# Patient Record
Sex: Female | Born: 1960
Health system: Southern US, Community
[De-identification: ages and names within clinical notes are randomized; demographics above are authoritative.]

## PROBLEM LIST (undated history)

## (undated) DIAGNOSIS — I219 Acute myocardial infarction, unspecified: Secondary | ICD-10-CM

## (undated) DIAGNOSIS — Z8619 Personal history of other infectious and parasitic diseases: Secondary | ICD-10-CM

## (undated) DIAGNOSIS — E119 Type 2 diabetes mellitus without complications: Secondary | ICD-10-CM

## (undated) DIAGNOSIS — E785 Hyperlipidemia, unspecified: Secondary | ICD-10-CM

## (undated) DIAGNOSIS — I10 Essential (primary) hypertension: Secondary | ICD-10-CM

## (undated) DIAGNOSIS — C73 Malignant neoplasm of thyroid gland: Secondary | ICD-10-CM

## (undated) HISTORY — PX: BREAST BIOPSY: SHX20

## (undated) HISTORY — DX: Malignant neoplasm of thyroid gland: C73

## (undated) HISTORY — DX: Personal history of other infectious and parasitic diseases: Z86.19

## (undated) HISTORY — PX: REDUCTION MAMMAPLASTY: SUR839

## (undated) HISTORY — DX: Type 2 diabetes mellitus without complications: E11.9

## (undated) HISTORY — DX: Essential (primary) hypertension: I10

## (undated) HISTORY — PX: TONSILLECTOMY AND ADENOIDECTOMY: SUR1326

## (undated) HISTORY — PX: BREAST REDUCTION SURGERY: SHX8

## (undated) HISTORY — DX: Hyperlipidemia, unspecified: E78.5

---

## 2012-11-19 HISTORY — PX: ABDOMINAL HYSTERECTOMY: SHX81

## 2012-11-19 HISTORY — PX: THYROIDECTOMY: SHX17

## 2017-11-19 HISTORY — PX: CHOLECYSTECTOMY: SHX55

## 2017-11-19 HISTORY — PX: APPENDECTOMY: SHX54

## 2018-07-10 LAB — LIPID PANEL
Cholesterol: 250 — AB (ref 0–200)
HDL: 31 — AB (ref 35–70)
LDL Cholesterol: 144
Triglycerides: 375 — AB (ref 40–160)

## 2018-07-10 LAB — HEMOGLOBIN A1C: Hemoglobin A1C: 6

## 2018-07-10 LAB — CA 19-9 (SERIAL): CA 19-9: 32

## 2018-11-14 ENCOUNTER — Emergency Department: Payer: 59

## 2018-11-14 ENCOUNTER — Other Ambulatory Visit: Payer: Self-pay

## 2018-11-14 ENCOUNTER — Emergency Department
Admission: EM | Admit: 2018-11-14 | Discharge: 2018-11-14 | Disposition: A | Discharge status: Home / Self Care | Payer: 59 | Attending: Emergency Medicine | Admitting: Emergency Medicine

## 2018-11-14 DIAGNOSIS — H539 Unspecified visual disturbance: Secondary | ICD-10-CM

## 2018-11-14 DIAGNOSIS — H5789 Other specified disorders of eye and adnexa: Secondary | ICD-10-CM | POA: Diagnosis present

## 2018-11-14 LAB — BASIC METABOLIC PANEL
Anion gap: 9 (ref 5–15)
BUN: 12 mg/dL (ref 6–20)
CO2: 28 mmol/L (ref 22–32)
Calcium: 9.1 mg/dL (ref 8.9–10.3)
Chloride: 104 mmol/L (ref 98–111)
Creatinine, Ser: 0.86 mg/dL (ref 0.44–1.00)
GFR calc Af Amer: >60 mL/min (ref >60)
Glucose, Bld: 167 mg/dL — ABNORMAL HIGH (ref 70–99)
Potassium: 3.8 mmol/L (ref 3.5–5.1)
Sodium: 141 mmol/L (ref 135–145)

## 2018-11-14 LAB — CBC
HCT: 38.1 % (ref 36.0–46.0)
Hemoglobin: 12.6 g/dL (ref 12.0–15.0)
MCH: 30.1 pg (ref 26.0–34.0)
MCHC: 33.1 g/dL (ref 30.0–36.0)
MCV: 91.1 fL (ref 80.0–100.0)
NRBC: 0 % (ref 0.0–0.2)
PLATELETS: 236 10*3/uL (ref 150–400)
RBC: 4.18 MIL/uL (ref 3.87–5.11)
RDW: 12 % (ref 11.5–15.5)
WBC: 4 10*3/uL (ref 4.0–10.5)

## 2018-11-14 MED ORDER — GADOBUTROL 1 MMOL/ML IV SOLN
6.5000 mL | Freq: Once | INTRAVENOUS | Status: AC | PRN
  Administered 2018-11-14: 6.5 mL via INTRAVENOUS

## 2018-11-14 NOTE — ED Notes (Signed)
Discussed pt with Dr. Cherylann Banas, new orders received for labs and visual acuity screening.

## 2018-11-14 NOTE — ED Notes (Signed)
ED Provider at bedside. 

## 2018-11-14 NOTE — ED Triage Notes (Addendum)
Pt arrived via POV with reports of recently being diagnosed with an inflammed optic nerve on the left side in [place] on 12/6 where she lives, pt states that her optometrist in [place] told her to come to get an MRI.   Pt states over the past several days she has had worsening vision problems, pt states she has an appt with [facility] neuroophthamology in a week.

## 2018-11-14 NOTE — ED Notes (Signed)
Patient returned from MRI.

## 2018-11-14 NOTE — Discharge Instructions (Addendum)
Follow-up with the neuro-ophthalmologist as scheduled.  Return to the ER for new, worsening, or persistent severe vision changes or vision loss, eye pain, swelling of the eye, any confusion or change in mental status, severe headache, vomiting, weakness or numbness, or any other new or worsening symptoms that concern you.

## 2018-11-14 NOTE — ED Notes (Signed)
Patient transported to MRI 

## 2018-11-14 NOTE — ED Notes (Signed)
Pt was dx with optic neuritis and has appt with [facility] next week.  Has decreased lower and inner visual field that started December 5th but per patient over last 2 days had started increasing more toward center of visual field.  She spoke to ophthalmologist and they told her to come to ED for MRI.  Mild "soreness behind left eye" but no other pain".  Ambulatory.

## 2018-11-14 NOTE — ED Provider Notes (Signed)
[facility] Emergency Department Provider Note ____________________________________________   First MD Initiated Contact with Patient 11/14/18 1828     (approximate)  I have reviewed the triage vital signs and the nursing notes.   HISTORY  Chief Complaint Eye Problem    HPI Melissa Lamb is a 57 y.o. female who presents with concern for optic nerve inflammation, referred for MRI.  The patient reports loss of vision in the medial lower quadrant of her vision in the left eye since December 5.  It has worsened slightly over the last several days.  The patient saw an ophthalmologist already, but has not had any imaging.  She was told to come to the ER for evaluation and likely MRI since her symptoms were worsening.  The patient also reports some soreness or pressure behind her left eye but no pain.  She denies any numbness, weakness, or other neurologic symptoms.  She denies any prior history of optic neuritis or MS.  She reports that she lives in [place] but is visiting her daughter here in [place], and also has follow-up set up with a neuro-ophthalmologist at [facility] for next week.  No past medical history on file.  There are no active problems to display for this patient.     Prior to Admission medications   Not on File    Allergies Patient has no known allergies.  No family history on file.  Social History Social History   Tobacco Use  . Smoking status: Not on file  Substance Use Topics  . Alcohol use: Not on file  . Drug use: Not on file    Review of Systems  Constitutional: No fever. Eyes: Positive for worsening vision loss. ENT: No neck pain. Cardiovascular: Denies chest pain. Respiratory: Denies shortness of breath. Gastrointestinal: No vomiting.  Genitourinary: Negative for flank pain.  Musculoskeletal: Negative for back pain. Skin: Negative for rash. Neurological: Negative for  headache.  ____________________________________________   PHYSICAL EXAM:  VITAL SIGNS: ED Triage Vitals  Enc Vitals Group     BP --      Pulse --      Resp --      Temp --      Temp src --      SpO2 --      Weight 11/14/18 1543 146 lb (66.2 kg)     Height 11/14/18 1543 5\' 8"  (1.727 m)     Head Circumference --      Peak Flow --      Pain Score 11/14/18 1547 2     Pain Loc --      Pain Edu? --      Excl. in [place]? --     Constitutional: Alert and oriented. Well appearing and in no acute distress. Eyes: Conjunctivae are normal.  EOMI.  PERRLA. Head: Atraumatic. Nose: No congestion/rhinnorhea. Mouth/Throat: Mucous membranes are moist.   Neck: Normal range of motion.  Cardiovascular: Good peripheral circulation. Respiratory: Normal respiratory effort.   Gastrointestinal: No distention.  Musculoskeletal:  Extremities warm and well perfused.  Neurologic:  Normal speech and language.  Motor intact in all extremities.  Normal coordination.  No gross focal neurologic deficits are appreciated.  Skin:  Skin is warm and dry. No rash noted. Psychiatric: Mood and affect are normal. Speech and behavior are normal.  ____________________________________________   LABS (all labs ordered are listed, but only abnormal results are displayed)  Labs Reviewed  BASIC METABOLIC PANEL - Abnormal; Notable for the following components:  Result Value   Glucose, Bld 167 (*)    All other components within normal limits  CBC   ____________________________________________  EKG   ____________________________________________  RADIOLOGY  MR brain: Left orbital floor defect which does not appear acute; no  acute abnormalities  ____________________________________________   PROCEDURES  Procedure(s) performed: No  Procedures  Critical Care performed: No ____________________________________________   INITIAL IMPRESSION / ASSESSMENT AND PLAN / ED COURSE  Pertinent labs & imaging  results that were available during my care of the patient were reviewed by me and considered in my medical decision making (see chart for details).  57 year old female with PMH as noted above presents with vision loss in 1 quadrant of her left eye over the last 3 weeks, but worsening over the last several days.  She was instructed by her ophthalmologist to come to the ED for an MRI.  The patient denies any other vision symptoms or any other acute neurologic symptoms.  She has no history of MS, and has never previously had  On exam the patient has a deficit in the medial lower quadrant in the left eye but no other acute neurologic symptoms.  She is comfortable appearing and her vital signs are normal except for slight hypertension.  We will obtain an MRI with and without contrast of the brain as recommended by radiology.  ----------------------------------------- 12:06 AM on 11/15/2018 -----------------------------------------  MRI showed a small possible defect in the floor of the left orbit but no acute abnormalities.  The patient denies any known trauma to the orbit.  This may be artifactual.  The MRI does not show any findings requiring further ED work-up, emergent consultation, or admission.  The patient already has follow-up arranged with neuro-ophthalmology.  She is stable for discharge at this time.  She feels comfortable with going home.  I discussed the results of the imaging with her.  I gave the patient thorough return precautions and she expressed understanding. ____________________________________________   FINAL CLINICAL IMPRESSION(S) / ED DIAGNOSES  Final diagnoses:  Visual disturbance      NEW MEDICATIONS STARTED DURING THIS VISIT:  There are no discharge medications for this patient.    Note:  This document was prepared using Dragon voice recognition software and may include unintentional dictation errors.    Arta Silence, MD 11/15/18 [phone]

## 2018-11-16 DIAGNOSIS — S83249A Other tear of medial meniscus, current injury, unspecified knee, initial encounter: Secondary | ICD-10-CM | POA: Insufficient documentation

## 2018-12-02 DIAGNOSIS — H47012 Ischemic optic neuropathy, left eye: Secondary | ICD-10-CM | POA: Insufficient documentation

## 2018-12-02 DIAGNOSIS — H3581 Retinal edema: Secondary | ICD-10-CM | POA: Insufficient documentation

## 2018-12-02 DIAGNOSIS — H53452 Other localized visual field defect, left eye: Secondary | ICD-10-CM | POA: Insufficient documentation

## 2019-10-20 ENCOUNTER — Encounter: Payer: Self-pay | Admitting: Family Medicine

## 2019-10-20 ENCOUNTER — Other Ambulatory Visit: Payer: Self-pay

## 2019-10-20 ENCOUNTER — Ambulatory Visit (INDEPENDENT_AMBULATORY_CARE_PROVIDER_SITE_OTHER): Payer: 59 | Admitting: Family Medicine

## 2019-10-20 VITALS — BP 158/88 | HR 64 | Temp 98.1°F | Resp 16 | Ht 67.5 in | Wt 139.5 lb

## 2019-10-20 DIAGNOSIS — I10 Essential (primary) hypertension: Secondary | ICD-10-CM | POA: Diagnosis not present

## 2019-10-20 DIAGNOSIS — Z1231 Encounter for screening mammogram for malignant neoplasm of breast: Secondary | ICD-10-CM | POA: Diagnosis not present

## 2019-10-20 DIAGNOSIS — E1169 Type 2 diabetes mellitus with other specified complication: Secondary | ICD-10-CM | POA: Insufficient documentation

## 2019-10-20 DIAGNOSIS — E119 Type 2 diabetes mellitus without complications: Secondary | ICD-10-CM | POA: Insufficient documentation

## 2019-10-20 DIAGNOSIS — E785 Hyperlipidemia, unspecified: Secondary | ICD-10-CM

## 2019-10-20 DIAGNOSIS — E890 Postprocedural hypothyroidism: Secondary | ICD-10-CM

## 2019-10-20 DIAGNOSIS — Z0000 Encounter for general adult medical examination without abnormal findings: Secondary | ICD-10-CM | POA: Insufficient documentation

## 2019-10-20 LAB — POCT GLYCOSYLATED HEMOGLOBIN (HGB A1C): Hemoglobin A1C: 5.8 % — AB (ref 4.0–5.6)

## 2019-10-20 MED ORDER — AMLODIPINE BESYLATE 5 MG PO TABS: 5.0000 mg | ORAL_TABLET | Freq: Every day | ORAL | 3 refills | Status: DC

## 2019-10-20 MED ORDER — METFORMIN HCL ER 500 MG PO TB24: 500.0000 mg | ORAL_TABLET | Freq: Two times a day (BID) | ORAL | 2 refills | Status: DC

## 2019-10-20 MED ORDER — METOPROLOL SUCCINATE ER 25 MG PO TB24: 25.0000 mg | ORAL_TABLET | Freq: Every day | ORAL | 2 refills | Status: DC

## 2019-10-20 MED ORDER — FENOFIBRATE 160 MG PO TABS: 160.0000 mg | ORAL_TABLET | Freq: Every day | ORAL | 2 refills | Status: AC

## 2019-10-20 MED ORDER — GLIPIZIDE 10 MG PO TABS: 10.0000 mg | ORAL_TABLET | Freq: Every day | ORAL | 2 refills | Status: DC

## 2019-10-20 NOTE — Patient Instructions (Addendum)
Your blood pressure high.   High blood pressure increases your risk for heart attack and stroke.   Start amlodipine  Check blood pressure - MyChart message in 3 weeks with what your blood pressure has been doing   Please check your blood pressure 2-4 times a week.   To check your blood pressure 1) Sit in a quiet and relaxed place for 5 minutes 2) Make sure your feet are flat on the ground 3) Consider checking first thing in the morning   Normal blood pressure is less than 140/90 Ideally you blood pressure should be around 120/80  Other ways you can reduce your blood pressure:  1) Regular exercise -- Try to get 150 minutes (30 minutes, 5 days a week) of moderate to vigorous aerobic excercise -- Examples: brisk walking (2.5 miles per hour), water aerobics, dancing, gardening, tennis, biking slower than 10 miles per hour 2) DASH Diet - low fat meats, more fresh fruits and vegetables, whole grains, low salt 3) Quit smoking if you smoke 4) Loose 5-10% of your body weight

## 2019-10-20 NOTE — Progress Notes (Signed)
Subjective:     Melissa Lamb is a 58 y.o. female presenting for Establish Care (Dr. Burnett Kanaris in [place] around 07/2019. [facility] [place] colonoscopy. 2014) and Medication Management     HPI   #s/p thyroidectomy - had her thyroid medication recently reduce  #Diabetes - on metformin - hemoglobin a1c was ~6% - was doing well until her husband became ill  #HLD - taking medication - no issues  #HTN - does not check at home - has not been walking as much - going through a lot of stress - husband recently passed away - takes metoprolol - had side effects on lisinopril - mild cough  #husband with hx of pancreas cancer - concerned his risk was environmental - that she may be at risk as well - husband had to have several CT scans before his cancer was found - had previous CA-19 which was normal   Review of Systems  Constitutional: Negative for chills and fever.  HENT: Negative for congestion.   Eyes: Negative for visual disturbance.       Has damage to her optic nerve  Respiratory: Negative for chest tightness and shortness of breath.   Cardiovascular: Negative for chest pain.  Gastrointestinal: Negative for abdominal pain.  Endocrine: Negative for cold intolerance and heat intolerance.  Genitourinary: Negative.   Musculoskeletal: Negative.   Skin: Negative.   Allergic/Immunologic: Negative.   Neurological: Negative for headaches.  Hematological: Negative.   Psychiatric/Behavioral: Negative.    I have reviewed the patients PMH, PSH, FmHx, Social Hx, medications and allergies and they are updated in Epic.    Social History   Tobacco Use  Smoking Status Never Smoker  Smokeless Tobacco Never Used        Objective:    BP Readings from Last 3 Encounters:  10/20/19 (!) 158/88  11/14/18 (!) 165/80   Wt Readings from Last 3 Encounters:  10/20/19 139 lb 8 oz (63.3 kg)  11/14/18 146 lb (66.2 kg)    BP (!) 158/88   Pulse 64   Temp 98.1 F (36.7  C)   Resp 16   Ht 5' 7.5" (1.715 m)   Wt 139 lb 8 oz (63.3 kg)   SpO2 98%   BMI 21.53 kg/m    Physical Exam Constitutional:      General: She is not in acute distress.    Appearance: She is well-developed. She is not diaphoretic.  HENT:     Right Ear: External ear normal.     Left Ear: External ear normal.     Nose: Nose normal.  Eyes:     Conjunctiva/sclera: Conjunctivae normal.  Neck:     Musculoskeletal: Neck supple.  Cardiovascular:     Rate and Rhythm: Normal rate and regular rhythm.     Heart sounds: No murmur.  Pulmonary:     Effort: Pulmonary effort is normal. No respiratory distress.     Breath sounds: Normal breath sounds. No wheezing.  Skin:    General: Skin is warm and dry.     Capillary Refill: Capillary refill takes less than 2 seconds.  Neurological:     Mental Status: She is alert. Mental status is at baseline.  Psychiatric:        Mood and Affect: Mood normal.        Behavior: Behavior normal.    Lab Results  Component Value Date   HGBA1C 5.8 (A) 10/20/2019          Assessment & Plan:   Problem  List Items Addressed This Visit      Cardiovascular and Mediastinum   Essential hypertension    BP elevated today and at other appointments. Recommended adding amlodipine today. Home monitoring and mychart in 3 weeks to determine when to f/u. Encouraged exercise      Relevant Medications   fenofibrate 160 MG tablet   metoprolol succinate (TOPROL-XL) 25 MG 24 hr tablet   amLODipine (NORVASC) 5 MG tablet     Endocrine   Diabetes mellitus without complication ([place]) - Primary    POC HgbA1c today. Encouraged exercise. Cont metformin. Mychart to pt that she could stop glipizide due to excellent control and continue to monitor.       Relevant Medications   glipiZIDE (GLUCOTROL) 10 MG tablet   metFORMIN (GLUCOPHAGE-XR) 500 MG 24 hr tablet   Other Relevant Orders   HgB A1c (Completed)   Hyperlipidemia associated with type 2 diabetes mellitus ([place])     Will obtain outside labs. Repeat annually.       Relevant Medications   fenofibrate 160 MG tablet   glipiZIDE (GLUCOTROL) 10 MG tablet   metFORMIN (GLUCOPHAGE-XR) 500 MG 24 hr tablet   Post-surgical hypothyroidism    Last TSH <0.1 follows with endocrinology. Synthroid recently reduced to 88 mcg. Continue to monitor, may be contributing to HTN      Relevant Medications   levothyroxine (SYNTHROID) 88 MCG tablet   metoprolol succinate (TOPROL-XL) 25 MG 24 hr tablet     Other   RESOLVED: Encounter for screening mammogram for malignant neoplasm of breast   Relevant Orders   MM Digital Screening       Return in about 3 months (around 01/18/2020).  [name], MD

## 2019-10-20 NOTE — Assessment & Plan Note (Signed)
Last TSH <0.1 follows with endocrinology. Synthroid recently reduced to 88 mcg. Continue to monitor, may be contributing to HTN

## 2019-10-20 NOTE — Assessment & Plan Note (Signed)
BP elevated today and at other appointments. Recommended adding amlodipine today. Home monitoring and mychart in 3 weeks to determine when to f/u. Encouraged exercise

## 2019-10-20 NOTE — Assessment & Plan Note (Signed)
Will obtain outside labs. Repeat annually.

## 2019-10-20 NOTE — Assessment & Plan Note (Addendum)
POC HgbA1c today. Encouraged exercise. Cont metformin. Mychart to pt that she could stop glipizide due to excellent control and continue to monitor.

## 2019-10-26 ENCOUNTER — Telehealth: Payer: Self-pay

## 2019-10-26 NOTE — Telephone Encounter (Signed)
Left message for patient to call back Patient stated during her LOV she saw Lars Mage for eye exam, in [place], per google one number came up and they do not have this provider. need to verify name and if she has phone number or name for the office. Have ROI form on my desk.

## 2019-10-27 ENCOUNTER — Telehealth: Payer: Self-pay

## 2019-10-27 NOTE — Telephone Encounter (Signed)
Copied from [place] [id number]. Topic: General - Inquiry >> Oct 26, 2019  5:11 PM Alease Frame wrote: Reason for [id number] returning call from office . Please advise

## 2019-10-27 NOTE — Telephone Encounter (Signed)
See other message in the chart. 

## 2019-10-27 NOTE — Telephone Encounter (Signed)
Spoke with patient. Collet[facility] [place] was an optometrist for glasses only but patient has been seen at [facility] eye office this year. Will request record if they have for diabetic eye exam. [address], [address]  [place], [place] [address]  [phone]  [phone] (Fax)

## 2019-10-30 ENCOUNTER — Ambulatory Visit
Admission: RE | Admit: 2019-10-30 | Discharge: 2019-10-30 | Disposition: A | Discharge status: Home / Self Care | Payer: 59 | Source: Ambulatory Visit | Attending: Family Medicine | Admitting: Family Medicine

## 2019-10-30 DIAGNOSIS — Z1231 Encounter for screening mammogram for malignant neoplasm of breast: Secondary | ICD-10-CM | POA: Diagnosis not present

## 2019-11-11 ENCOUNTER — Encounter: Payer: Self-pay | Admitting: Family Medicine

## 2019-11-11 ENCOUNTER — Other Ambulatory Visit: Payer: Self-pay | Admitting: Family Medicine

## 2019-11-11 DIAGNOSIS — N6489 Other specified disorders of breast: Secondary | ICD-10-CM

## 2019-11-11 DIAGNOSIS — R928 Other abnormal and inconclusive findings on diagnostic imaging of breast: Secondary | ICD-10-CM

## 2019-11-16 ENCOUNTER — Ambulatory Visit
Admission: RE | Admit: 2019-11-16 | Discharge: 2019-11-16 | Disposition: A | Discharge status: Home / Self Care | Payer: 59 | Source: Ambulatory Visit | Attending: Family Medicine | Admitting: Family Medicine

## 2019-11-16 ENCOUNTER — Encounter: Payer: Self-pay | Admitting: Family Medicine

## 2019-11-16 DIAGNOSIS — R928 Other abnormal and inconclusive findings on diagnostic imaging of breast: Secondary | ICD-10-CM

## 2019-11-16 DIAGNOSIS — N6489 Other specified disorders of breast: Secondary | ICD-10-CM | POA: Insufficient documentation

## 2020-01-26 ENCOUNTER — Ambulatory Visit: Payer: 59 | Admitting: Family Medicine

## 2020-02-22 ENCOUNTER — Encounter: Payer: Self-pay | Admitting: Family Medicine

## 2020-02-22 ENCOUNTER — Other Ambulatory Visit: Payer: Self-pay

## 2020-02-22 ENCOUNTER — Ambulatory Visit (INDEPENDENT_AMBULATORY_CARE_PROVIDER_SITE_OTHER): Payer: BC Managed Care – PPO | Admitting: Family Medicine

## 2020-02-22 VITALS — BP 122/88 | HR 64 | Temp 97.7°F | Ht 67.5 in | Wt 146.0 lb

## 2020-02-22 DIAGNOSIS — E119 Type 2 diabetes mellitus without complications: Secondary | ICD-10-CM | POA: Diagnosis not present

## 2020-02-22 DIAGNOSIS — E890 Postprocedural hypothyroidism: Secondary | ICD-10-CM | POA: Diagnosis not present

## 2020-02-22 DIAGNOSIS — I10 Essential (primary) hypertension: Secondary | ICD-10-CM | POA: Diagnosis not present

## 2020-02-22 DIAGNOSIS — Z1211 Encounter for screening for malignant neoplasm of colon: Secondary | ICD-10-CM

## 2020-02-22 DIAGNOSIS — R6889 Other general symptoms and signs: Secondary | ICD-10-CM | POA: Insufficient documentation

## 2020-02-22 LAB — POCT GLYCOSYLATED HEMOGLOBIN (HGB A1C): Hemoglobin A1C: 6.1 % — AB (ref 4.0–5.6)

## 2020-02-22 LAB — TSH: TSH: 0.73 u[IU]/mL (ref 0.35–4.50)

## 2020-02-22 NOTE — Assessment & Plan Note (Signed)
Hgb A1c 6.1, still good control. Discussed if doing better with diet and exercise and home numbers decreasing could try stopping glipizide. Otherwise continue metformin and glipizide.

## 2020-02-22 NOTE — Progress Notes (Signed)
Subjective:     Melissa Lamb is a 59 y.o. female presenting for Diabetes (Checks blood sugar at home regularly. "Winter was hard".), Hypertension (Checks BP occassionally.), and Ears and Eyes Issue     HPI  #Ears/Eyes issues - was getting a rash on both eyelids - every 6 months - started after iodine treatment - itchy ears - using oil in the ears - very itchy - baseline has significant itchiness of both eyes - rash most recently on February 25 and improved with oil facial product and cream  #Diabetes - had a harder time following the diet this based winter - [place] - was not exercising as often - noticed her numbers going up so went back on the medication - still taking metformin and glipizide  #HTN - taking the amlodipine 5 mg and metoprolol  #Hypothyroidism - was seeing endocrine - last TSH was abnormal and dose decreased - has not seen endocrinology - requests that I take over treatment  Review of Systems  10/20/2019: Clinic - HTN - adding amlodipine. DM - stop glipizide due to good control.   Social History   Tobacco Use  Smoking Status Never Smoker  Smokeless Tobacco Never Used        Objective:    BP Readings from Last 3 Encounters:  02/22/20 122/88  10/20/19 (!) 158/88  11/14/18 (!) 165/80   Wt Readings from Last 3 Encounters:  02/22/20 146 lb (66.2 kg)  10/20/19 139 lb 8 oz (63.3 kg)  11/14/18 146 lb (66.2 kg)    BP 122/88 (BP Location: Left Arm, Patient Position: Sitting, Cuff Size: Normal)   Pulse 64   Temp 97.7 F (36.5 C)   Ht 5' 7.5" (1.715 m)   Wt 146 lb (66.2 kg)   SpO2 100%   BMI 22.53 kg/m    Physical Exam Constitutional:      General: She is not in acute distress.    Appearance: She is well-developed. She is not diaphoretic.  HENT:     Right Ear: Tympanic membrane, ear canal and external ear normal.     Left Ear: Tympanic membrane, ear canal and external ear normal.     Nose: Nose  normal.  Eyes:     General: No scleral icterus.    Extraocular Movements: Extraocular movements intact.     Conjunctiva/sclera: Conjunctivae normal.  Cardiovascular:     Rate and Rhythm: Normal rate.  Pulmonary:     Effort: Pulmonary effort is normal.  Musculoskeletal:     Cervical back: Neck supple.  Skin:    General: Skin is warm and dry.     Capillary Refill: Capillary refill takes less than 2 seconds.  Neurological:     Mental Status: She is alert. Mental status is at baseline.  Psychiatric:        Mood and Affect: Mood normal.        Behavior: Behavior normal.           Assessment & Plan:   Problem List Items Addressed This Visit      Cardiovascular and Mediastinum   Essential hypertension    BP at goal, continue amlodipine and metoprolol        Endocrine   Diabetes mellitus without complication ([facility]) - Primary    Hgb A1c 6.1, still good control. Discussed if doing better with diet and exercise and home numbers decreasing could try stopping glipizide. Otherwise continue metformin and glipizide.  Relevant Orders   HgB A1c (Completed)   Post-surgical hypothyroidism    Last dose decrease to 88 mcg was several months ago. Repeat TSH and change as needed.       Relevant Orders   TSH     Other   Itchy eyes    Pt notes recurrent rash (not present today) and itchy eyes/ears since iodine treatment. Has seen ophthalmology and ENT in the past for this. Recommended continue oil treatment as she is doing and add daily allergy medication to see if itching improves. Return if worsening symptoms present. Normal exam today       Other Visit Diagnoses    Screening for colon cancer       Relevant Orders   Ambulatory referral to Gastroenterology       Return in about 3 months (around 05/23/2020).  [name], MD

## 2020-02-22 NOTE — Assessment & Plan Note (Signed)
Pt notes recurrent rash (not present today) and itchy eyes/ears since iodine treatment. Has seen ophthalmology and ENT in the past for this. Recommended continue oil treatment as she is doing and add daily allergy medication to see if itching improves. Return if worsening symptoms present. Normal exam today

## 2020-02-22 NOTE — Patient Instructions (Addendum)
Great to see you today!   Diabetes - keep taking your current medications - continue to work on adding exercising - if you notice your blood sugar is improving, you could try stopping the glipizide if you notice improved control  Hypertension - keep taking the amlodipine - blood pressure looks great!  Ears and Eyes - consider trying a daily allergy medication (Claritin, Zyrtec, allegra)   Call back with where you got your liver ultrasound done

## 2020-02-22 NOTE — Assessment & Plan Note (Signed)
Last dose decrease to 88 mcg was several months ago. Repeat TSH and change as needed.

## 2020-02-22 NOTE — Assessment & Plan Note (Signed)
BP at goal, continue amlodipine and metoprolol

## 2020-03-10 ENCOUNTER — Other Ambulatory Visit: Payer: Self-pay

## 2020-03-10 DIAGNOSIS — E119 Type 2 diabetes mellitus without complications: Secondary | ICD-10-CM

## 2020-03-10 MED ORDER — METFORMIN HCL ER 500 MG PO TB24: 500.0000 mg | ORAL_TABLET | Freq: Two times a day (BID) | ORAL | 2 refills | Status: DC

## 2020-04-01 ENCOUNTER — Inpatient Hospital Stay
Admission: EM | Admit: 2020-04-01 | Discharge: 2020-04-03 | DRG: 247 | Disposition: A | Discharge status: Home / Self Care | Payer: BC Managed Care – PPO | Attending: Hospitalist | Admitting: Hospitalist

## 2020-04-01 ENCOUNTER — Emergency Department: Payer: BC Managed Care – PPO

## 2020-04-01 ENCOUNTER — Other Ambulatory Visit: Payer: Self-pay

## 2020-04-01 ENCOUNTER — Encounter: Payer: Self-pay | Admitting: Emergency Medicine

## 2020-04-01 ENCOUNTER — Encounter
Admission: EM | Disposition: A | Discharge status: Home / Self Care | Payer: Self-pay | Source: Home / Self Care | Attending: Hospitalist

## 2020-04-01 DIAGNOSIS — Z833 Family history of diabetes mellitus: Secondary | ICD-10-CM

## 2020-04-01 DIAGNOSIS — I25119 Atherosclerotic heart disease of native coronary artery with unspecified angina pectoris: Secondary | ICD-10-CM | POA: Diagnosis present

## 2020-04-01 DIAGNOSIS — Z83438 Family history of other disorder of lipoprotein metabolism and other lipidemia: Secondary | ICD-10-CM

## 2020-04-01 DIAGNOSIS — Z20822 Contact with and (suspected) exposure to covid-19: Secondary | ICD-10-CM | POA: Diagnosis not present

## 2020-04-01 DIAGNOSIS — I25110 Atherosclerotic heart disease of native coronary artery with unstable angina pectoris: Secondary | ICD-10-CM | POA: Diagnosis not present

## 2020-04-01 DIAGNOSIS — Z79890 Hormone replacement therapy (postmenopausal): Secondary | ICD-10-CM | POA: Diagnosis not present

## 2020-04-01 DIAGNOSIS — I2119 ST elevation (STEMI) myocardial infarction involving other coronary artery of inferior wall: Secondary | ICD-10-CM | POA: Diagnosis not present

## 2020-04-01 DIAGNOSIS — E1169 Type 2 diabetes mellitus with other specified complication: Secondary | ICD-10-CM | POA: Diagnosis present

## 2020-04-01 DIAGNOSIS — Z95820 Peripheral vascular angioplasty status with implants and grafts: Secondary | ICD-10-CM | POA: Diagnosis not present

## 2020-04-01 DIAGNOSIS — Z7984 Long term (current) use of oral hypoglycemic drugs: Secondary | ICD-10-CM

## 2020-04-01 DIAGNOSIS — Z8249 Family history of ischemic heart disease and other diseases of the circulatory system: Secondary | ICD-10-CM | POA: Diagnosis not present

## 2020-04-01 DIAGNOSIS — Z8049 Family history of malignant neoplasm of other genital organs: Secondary | ICD-10-CM

## 2020-04-01 DIAGNOSIS — R0602 Shortness of breath: Secondary | ICD-10-CM | POA: Diagnosis not present

## 2020-04-01 DIAGNOSIS — I252 Old myocardial infarction: Secondary | ICD-10-CM | POA: Diagnosis present

## 2020-04-01 DIAGNOSIS — I2102 ST elevation (STEMI) myocardial infarction involving left anterior descending coronary artery: Secondary | ICD-10-CM | POA: Diagnosis not present

## 2020-04-01 DIAGNOSIS — Z803 Family history of malignant neoplasm of breast: Secondary | ICD-10-CM

## 2020-04-01 DIAGNOSIS — I10 Essential (primary) hypertension: Secondary | ICD-10-CM | POA: Diagnosis not present

## 2020-04-01 DIAGNOSIS — Z85850 Personal history of malignant neoplasm of thyroid: Secondary | ICD-10-CM | POA: Diagnosis not present

## 2020-04-01 DIAGNOSIS — I2129 ST elevation (STEMI) myocardial infarction involving other sites: Principal | ICD-10-CM | POA: Diagnosis present

## 2020-04-01 DIAGNOSIS — I214 Non-ST elevation (NSTEMI) myocardial infarction: Secondary | ICD-10-CM | POA: Diagnosis not present

## 2020-04-01 DIAGNOSIS — E782 Mixed hyperlipidemia: Secondary | ICD-10-CM | POA: Diagnosis not present

## 2020-04-01 DIAGNOSIS — E785 Hyperlipidemia, unspecified: Secondary | ICD-10-CM

## 2020-04-01 DIAGNOSIS — E890 Postprocedural hypothyroidism: Secondary | ICD-10-CM | POA: Diagnosis present

## 2020-04-01 DIAGNOSIS — R079 Chest pain, unspecified: Secondary | ICD-10-CM

## 2020-04-01 DIAGNOSIS — R0789 Other chest pain: Secondary | ICD-10-CM | POA: Diagnosis not present

## 2020-04-01 HISTORY — PX: LEFT HEART CATH AND CORONARY ANGIOGRAPHY: CATH118249

## 2020-04-01 HISTORY — PX: CORONARY/GRAFT ACUTE MI REVASCULARIZATION: CATH118305

## 2020-04-01 LAB — PROTIME-INR
INR: 1.5 — ABNORMAL HIGH (ref 0.8–1.2)
Prothrombin Time: 17.8 seconds — ABNORMAL HIGH (ref 11.4–15.2)

## 2020-04-01 LAB — BASIC METABOLIC PANEL
Anion gap: 10 (ref 5–15)
BUN: 14 mg/dL (ref 6–20)
CO2: 26 mmol/L (ref 22–32)
Calcium: 9.5 mg/dL (ref 8.9–10.3)
Chloride: 102 mmol/L (ref 98–111)
Creatinine, Ser: 0.79 mg/dL (ref 0.44–1.00)
GFR calc Af Amer: >60 mL/min (ref >60)
GFR calc non Af Amer: >60 mL/min (ref >60)
Glucose, Bld: 145 mg/dL — ABNORMAL HIGH (ref 70–99)
Potassium: 3.7 mmol/L (ref 3.5–5.1)
Sodium: 138 mmol/L (ref 135–145)

## 2020-04-01 LAB — HEMOGLOBIN A1C
Hgb A1c MFr Bld: 6 % — ABNORMAL HIGH (ref 4.8–5.6)
Mean Plasma Glucose: 125.5 mg/dL

## 2020-04-01 LAB — CBC
HCT: 40.3 % (ref 36.0–46.0)
Hemoglobin: 13.5 g/dL (ref 12.0–15.0)
MCH: 30.9 pg (ref 26.0–34.0)
MCHC: 33.5 g/dL (ref 30.0–36.0)
MCV: 92.2 fL (ref 80.0–100.0)
Platelets: 244 10*3/uL (ref 150–400)
RBC: 4.37 MIL/uL (ref 3.87–5.11)
RDW: 11.9 % (ref 11.5–15.5)
WBC: 11.3 10*3/uL — ABNORMAL HIGH (ref 4.0–10.5)
nRBC: 0 % (ref 0.0–0.2)

## 2020-04-01 LAB — APTT: aPTT: 78 seconds — ABNORMAL HIGH (ref 24–36)

## 2020-04-01 LAB — POCT ACTIVATED CLOTTING TIME: Activated Clotting Time: 373 seconds

## 2020-04-01 LAB — TROPONIN I (HIGH SENSITIVITY)
Troponin I (High Sensitivity): 9616 ng/L (ref <18)
Troponin I (High Sensitivity): >27000 ng/L (ref <18)

## 2020-04-01 LAB — GLUCOSE, CAPILLARY: Glucose-Capillary: 167 mg/dL — ABNORMAL HIGH (ref 70–99)

## 2020-04-01 LAB — MRSA PCR SCREENING: MRSA by PCR: NEGATIVE

## 2020-04-01 LAB — SARS CORONAVIRUS 2 BY RT PCR (HOSPITAL ORDER, PERFORMED IN ~~LOC~~ HOSPITAL LAB): SARS Coronavirus 2: NEGATIVE

## 2020-04-01 SURGERY — CORONARY/GRAFT ACUTE MI REVASCULARIZATION
Anesthesia: Moderate Sedation

## 2020-04-01 MED ORDER — ASPIRIN 81 MG PO CHEW
324.0000 mg | CHEWABLE_TABLET | Freq: Once | ORAL | Status: AC
  Administered 2020-04-01: 324 mg via ORAL
  Filled 2020-04-01: qty 4

## 2020-04-01 MED ORDER — HEPARIN SODIUM (PORCINE) 5000 UNIT/ML IJ SOLN
5000.0000 [IU] | Freq: Three times a day (TID) | INTRAMUSCULAR | Status: DC
  Administered 2020-04-01 – 2020-04-02 (×2): 5000 [IU] via SUBCUTANEOUS
  Filled 2020-04-01 (×2): qty 1

## 2020-04-01 MED ORDER — METOPROLOL SUCCINATE ER 25 MG PO TB24
25.0000 mg | ORAL_TABLET | Freq: Every day | ORAL | Status: DC
  Administered 2020-04-02 – 2020-04-03 (×2): 25 mg via ORAL
  Filled 2020-04-01 (×2): qty 1

## 2020-04-01 MED ORDER — ONDANSETRON HCL 4 MG/2ML IJ SOLN: 4.0000 mg | Freq: Four times a day (QID) | INTRAMUSCULAR | Status: DC | PRN

## 2020-04-01 MED ORDER — INSULIN ASPART 100 UNIT/ML ~~LOC~~ SOLN: 0.0000 [IU] | Freq: Every day | SUBCUTANEOUS | Status: DC

## 2020-04-01 MED ORDER — MIDAZOLAM HCL 2 MG/2ML IJ SOLN
INTRAMUSCULAR | Status: AC
  Filled 2020-04-01: qty 2

## 2020-04-01 MED ORDER — SODIUM CHLORIDE 0.9 % IV SOLN: INTRAVENOUS | Status: DC

## 2020-04-01 MED ORDER — LISINOPRIL 10 MG PO TABS
5.0000 mg | ORAL_TABLET | Freq: Every day | ORAL | Status: DC
  Filled 2020-04-01: qty 1
  Filled 2020-04-01 (×2): qty 0.5

## 2020-04-01 MED ORDER — TICAGRELOR 90 MG PO TABS
90.0000 mg | ORAL_TABLET | Freq: Two times a day (BID) | ORAL | Status: DC
  Administered 2020-04-02 – 2020-04-03 (×4): 90 mg via ORAL
  Filled 2020-04-01 (×4): qty 1

## 2020-04-01 MED ORDER — HEPARIN (PORCINE) IN NACL 1000-0.9 UT/500ML-% IV SOLN
INTRAVENOUS | Status: AC
  Filled 2020-04-01: qty 1000

## 2020-04-01 MED ORDER — TICAGRELOR 90 MG PO TABS
ORAL_TABLET | ORAL | Status: DC | PRN
  Administered 2020-04-01: 180 mg via ORAL

## 2020-04-01 MED ORDER — NITROGLYCERIN 1 MG/10 ML FOR IR/CATH LAB
INTRA_ARTERIAL | Status: DC | PRN
  Administered 2020-04-01 (×2): 200 ug via INTRACORONARY

## 2020-04-01 MED ORDER — TIROFIBAN (AGGRASTAT) BOLUS VIA INFUSION
INTRAVENOUS | Status: DC | PRN
  Administered 2020-04-01: 1655 ug via INTRAVENOUS

## 2020-04-01 MED ORDER — FENTANYL CITRATE (PF) 100 MCG/2ML IJ SOLN
INTRAMUSCULAR | Status: AC
  Filled 2020-04-01: qty 2

## 2020-04-01 MED ORDER — IOHEXOL 300 MG/ML  SOLN
INTRAMUSCULAR | Status: DC | PRN
  Administered 2020-04-01: 365 mL

## 2020-04-01 MED ORDER — TICAGRELOR 90 MG PO TABS
ORAL_TABLET | ORAL | Status: AC
  Filled 2020-04-01: qty 1

## 2020-04-01 MED ORDER — ROSUVASTATIN CALCIUM 10 MG PO TABS
40.0000 mg | ORAL_TABLET | Freq: Every day | ORAL | Status: DC
  Administered 2020-04-01 – 2020-04-03 (×3): 40 mg via ORAL
  Filled 2020-04-01 (×3): qty 2
  Filled 2020-04-01 (×2): qty 4

## 2020-04-01 MED ORDER — TIROFIBAN HCL IV 12.5 MG/250 ML
INTRAVENOUS | Status: AC | PRN
  Administered 2020-04-01: 0.075 ug/kg/min via INTRAVENOUS

## 2020-04-01 MED ORDER — SODIUM CHLORIDE 0.9% FLUSH
3.0000 mL | Freq: Two times a day (BID) | INTRAVENOUS | Status: DC
  Administered 2020-04-02 (×2): 3 mL via INTRAVENOUS

## 2020-04-01 MED ORDER — SODIUM CHLORIDE 0.9 % IV SOLN: 250.0000 mL | INTRAVENOUS | Status: DC | PRN

## 2020-04-01 MED ORDER — NITROGLYCERIN 0.4 MG SL SUBL: 0.4000 mg | SUBLINGUAL_TABLET | SUBLINGUAL | Status: DC | PRN

## 2020-04-01 MED ORDER — FENTANYL CITRATE (PF) 100 MCG/2ML IJ SOLN
INTRAMUSCULAR | Status: DC | PRN
  Administered 2020-04-01 (×2): 25 ug via INTRAVENOUS

## 2020-04-01 MED ORDER — SODIUM CHLORIDE 0.9 % WEIGHT BASED INFUSION
1.0000 mL/kg/h | INTRAVENOUS | Status: AC
  Administered 2020-04-01: 1 mL/kg/h via INTRAVENOUS

## 2020-04-01 MED ORDER — HEPARIN SODIUM (PORCINE) 5000 UNIT/ML IJ SOLN
60.0000 [IU]/kg | Freq: Once | INTRAMUSCULAR | Status: AC
  Administered 2020-04-01: 3950 [IU] via INTRAVENOUS
  Filled 2020-04-01: qty 1

## 2020-04-01 MED ORDER — BIVALIRUDIN BOLUS VIA INFUSION - CUPID
INTRAVENOUS | Status: DC | PRN
  Administered 2020-04-01: 49.65 mg via INTRAVENOUS

## 2020-04-01 MED ORDER — MORPHINE SULFATE (PF) 4 MG/ML IV SOLN
4.0000 mg | INTRAVENOUS | Status: DC | PRN
  Administered 2020-04-01: 4 mg via INTRAVENOUS
  Filled 2020-04-01: qty 1

## 2020-04-01 MED ORDER — LABETALOL HCL 5 MG/ML IV SOLN: 10.0000 mg | INTRAVENOUS | Status: AC | PRN

## 2020-04-01 MED ORDER — SODIUM CHLORIDE 0.9 % IV SOLN: INTRAVENOUS | Status: DC | PRN

## 2020-04-01 MED ORDER — NITROGLYCERIN 1 MG/10 ML FOR IR/CATH LAB
INTRA_ARTERIAL | Status: AC
  Filled 2020-04-01: qty 10

## 2020-04-01 MED ORDER — SODIUM CHLORIDE 0.9% FLUSH: 3.0000 mL | INTRAVENOUS | Status: DC | PRN

## 2020-04-01 MED ORDER — ONDANSETRON HCL 4 MG/2ML IJ SOLN
4.0000 mg | Freq: Once | INTRAMUSCULAR | Status: AC
  Administered 2020-04-01: 4 mg via INTRAVENOUS
  Filled 2020-04-01: qty 2

## 2020-04-01 MED ORDER — ONDANSETRON HCL 4 MG PO TABS: 4.0000 mg | ORAL_TABLET | Freq: Four times a day (QID) | ORAL | Status: DC | PRN

## 2020-04-01 MED ORDER — BIVALIRUDIN TRIFLUOROACETATE 250 MG IV SOLR
INTRAVENOUS | Status: AC
  Filled 2020-04-01: qty 250

## 2020-04-01 MED ORDER — INSULIN ASPART 100 UNIT/ML ~~LOC~~ SOLN
0.0000 [IU] | Freq: Three times a day (TID) | SUBCUTANEOUS | Status: DC
  Administered 2020-04-02 (×2): 3 [IU] via SUBCUTANEOUS
  Administered 2020-04-02: 2 [IU] via SUBCUTANEOUS
  Administered 2020-04-03: 3 [IU] via SUBCUTANEOUS
  Administered 2020-04-03: 1 [IU] via SUBCUTANEOUS
  Administered 2020-04-03: 3 [IU] via SUBCUTANEOUS
  Filled 2020-04-01 (×6): qty 1

## 2020-04-01 MED ORDER — HYDRALAZINE HCL 20 MG/ML IJ SOLN: 10.0000 mg | INTRAMUSCULAR | Status: AC | PRN

## 2020-04-01 MED ORDER — ACETAMINOPHEN 325 MG PO TABS
650.0000 mg | ORAL_TABLET | ORAL | Status: DC | PRN
  Administered 2020-04-02: 650 mg via ORAL
  Filled 2020-04-01: qty 2

## 2020-04-01 MED ORDER — SODIUM CHLORIDE 0.9 % IV SOLN
INTRAVENOUS | Status: AC | PRN
  Administered 2020-04-01: 1.75 mg/kg/h via INTRAVENOUS

## 2020-04-01 MED ORDER — TIROFIBAN HCL IV 12.5 MG/250 ML
INTRAVENOUS | Status: AC
  Filled 2020-04-01: qty 250

## 2020-04-01 MED ORDER — ASPIRIN 81 MG PO CHEW
81.0000 mg | CHEWABLE_TABLET | Freq: Every day | ORAL | Status: DC
  Administered 2020-04-02 – 2020-04-03 (×2): 81 mg via ORAL
  Filled 2020-04-01 (×2): qty 1

## 2020-04-01 MED ORDER — ADENOSINE (DIAGNOSTIC) FOR INTRACORONARY USE
INTRAVENOUS | Status: DC | PRN
  Administered 2020-04-01: 48 ug via INTRACORONARY

## 2020-04-01 MED ORDER — HEPARIN (PORCINE) IN NACL 1000-0.9 UT/500ML-% IV SOLN
INTRAVENOUS | Status: DC | PRN
  Administered 2020-04-01: 1500 mL

## 2020-04-01 MED ORDER — LEVOTHYROXINE SODIUM 88 MCG PO TABS
88.0000 ug | ORAL_TABLET | Freq: Every day | ORAL | Status: DC
  Administered 2020-04-02 – 2020-04-03 (×2): 88 ug via ORAL
  Filled 2020-04-01 (×3): qty 1

## 2020-04-01 MED ORDER — ADENOSINE 6 MG/2ML IV SOLN
INTRAVENOUS | Status: AC
  Filled 2020-04-01: qty 2

## 2020-04-01 MED ORDER — MIDAZOLAM HCL 2 MG/2ML IJ SOLN
INTRAMUSCULAR | Status: DC | PRN
  Administered 2020-04-01 (×2): 1 mg via INTRAVENOUS

## 2020-04-01 SURGICAL SUPPLY — 16 items
BALLN TREK RX 2.5X15 (BALLOONS) ×3
BALLOON TREK RX 2.5X15 (BALLOONS) ×1 IMPLANT
CATH INFINITI 5 FR JL3.5 (CATHETERS) ×3 IMPLANT
CATH INFINITI 5FR JL4 (CATHETERS) ×3 IMPLANT
CATH INFINITI JR4 5F (CATHETERS) ×3 IMPLANT
CATH VISTA GUIDE 6FR XB3 (CATHETERS) ×3 IMPLANT
DEVICE CLOSURE MYNXGRIP 6/7F (Vascular Products) ×3 IMPLANT
DEVICE INFLAT 30 PLUS (MISCELLANEOUS) ×3 IMPLANT
KIT MANI 3VAL PERCEP (MISCELLANEOUS) ×3 IMPLANT
NEEDLE PERC 18GX7CM (NEEDLE) ×3 IMPLANT
PACK CARDIAC CATH (CUSTOM PROCEDURE TRAY) ×3 IMPLANT
SHEATH AVANTI 6FR X 11CM (SHEATH) ×3 IMPLANT
STENT RESOLUTE ONYX 2.5X30 (Permanent Stent) ×3 IMPLANT
STENT RESOLUTE ONYX 3.5X15 (Permanent Stent) ×3 IMPLANT
WIRE G HI TQ BMW 190 (WIRE) ×3 IMPLANT
WIRE GUIDERIGHT .035X150 (WIRE) ×3 IMPLANT

## 2020-04-01 NOTE — ED Notes (Addendum)
Callwood, MD at bedside. °

## 2020-04-01 NOTE — ED Notes (Signed)
Dr. Callwood at bedside. °

## 2020-04-01 NOTE — Consult Note (Signed)
CARDIOLOGY CONSULT NOTE               Patient ID: Melissa Lamb MRN: [id number] DOB/AGE: [birth year] 59 y.o.  Admit date: 04/01/2020 Referring Physician Dr. Quentin Lamb ER Primary Physician unknown Primary Cardiologist Dr Melissa Lamb Reason for Consultation STEMI posterior wall  HPI: Patient is a 59 year old white female history of diabetes hypertension hyperlipidemia recently moved here from [place] after the death of her husband who was a anesthesiologist she died of cancer.  Patient states she has been having recurrent persistent chest pain symptoms radiating to her back over the last 3 to 4 days to be intermittent and recurrent but got particularly bad today so finally came to the emergency room for evaluation.  Denies any nausea vomiting or diarrhea no fever chills or sweats denies any previous cardiac history but has multiple risk factors including diabetes hypertension hyperlipidemia.  With the worsening symptoms she was evaluated emergency room and eventually ER physician called a code STEMI for what he thought to be a posterior infarct the patient initially had 8 out of 10 chest pain but somewhat improved with therapy in emergency room including heparin aspirin nitrates.  She has a history of thyroid cancer is on medications for her diabetes hypertension and hyperlipidemia.  Patient denies smoking.  Review of systems complete and found to be negative unless listed above     Past Medical History:  Diagnosis Date  . Diabetes mellitus without complication ([place])   . History of chickenpox   . Hyperlipidemia   . Hypertension   . Thyroid cancer [facility])     Past Surgical History:  Procedure Laterality Date  . ABDOMINAL HYSTERECTOMY  2014   has both ovaries still.  . APPENDECTOMY  2019  . BREAST BIOPSY Right    benign-seed was put in to mark it  . BREAST REDUCTION SURGERY    . CHOLECYSTECTOMY  2019  . REDUCTION MAMMAPLASTY    . THYROIDECTOMY  2014   papillary well-differentiated  lymph nodes.  . TONSILLECTOMY AND ADENOIDECTOMY      Medications Prior to Admission  Medication Sig Dispense Refill Last Dose  . amLODipine (NORVASC) 5 MG tablet Take 1 tablet (5 mg total) by mouth daily. 90 tablet 3   . Cholecalciferol (VITAMIN D-1000 MAX ST) 25 MCG (1000 UT) tablet Take 2,000 Units by mouth daily.      . fenofibrate 160 MG tablet Take 1 tablet (160 mg total) by mouth daily. 90 tablet 2   . glipiZIDE (GLUCOTROL) 5 MG tablet Take 7.5 mg by mouth daily.     Marland Kitchen levothyroxine (SYNTHROID) 88 MCG tablet Take 88 mcg by mouth daily.      . metFORMIN (GLUCOPHAGE) 500 MG tablet Take 500 mg by mouth 2 (two) times daily.     . metoprolol succinate (TOPROL-XL) 25 MG 24 hr tablet Take 1 tablet (25 mg total) by mouth daily. 90 tablet 2    Social History   Socioeconomic History  . Marital status: Widowed    Spouse name: Melissa Lamb  . Number of children: 2  . Years of education: Some college  . Highest education level: Not on file  Occupational History  . Not on file  Tobacco Use  . Smoking status: Never Smoker  . Smokeless tobacco: Never Used  Substance and Sexual Activity  . Alcohol use: Yes    Comment: rare  . Drug use: Never  . Sexual activity: Not Currently  Other Topics Concern  . Not on file  Social History  Narrative   10/20/19   From: [place]   Living: with son - Melissa Lamb   Work: not currently, caring for her grandchild   Widowed: husband, Melissa Lamb, passed away in 02/23/2019 - Dr who trained at [facility]      Family: Daughter - Melissa Lamb (nearby), has 1 grandchild - Melissa Lamb Feb 22, 2018)      Enjoys: crafts, doing projects, gardening      Exercise: not as often, tries to walk   Diet: well rounded, does eat some unhealthy food, tries to follow diabetic diet      Safety   Seat belts: Yes    Guns: No   Safe in relationships: Yes    Social Determinants of Radio broadcast assistant Strain: Low Risk   . Difficulty of Paying Living Expenses: Not hard at all  Food Insecurity:     . Worried About Charity fundraiser in the Last Year:   . Arboriculturist in the Last Year:   Transportation Needs:   . Film/video editor (Medical):   Marland Kitchen Lack of Transportation (Non-Medical):   Physical Activity:   . Days of Exercise per Week:   . Minutes of Exercise per Session:   Stress:   . Feeling of Stress :   Social Connections:   . Frequency of Communication with Friends and Family:   . Frequency of Social Gatherings with Friends and Family:   . Attends Religious Services:   . Active Member of Clubs or Organizations:   . Attends Archivist Meetings:   Marland Kitchen Marital Status:   Intimate Partner Violence:   . Fear of Current or Ex-Partner:   . Emotionally Abused:   Marland Kitchen Physically Abused:   . Sexually Abused:     Family History  Problem Relation Age of Onset  . Diabetes Mother   . Hyperlipidemia Mother   . Hypertension Mother   . Hyperlipidemia Father   . Heart disease Father   . Heart attack Father 27       had defibrillator put in  . Diabetes Sister   . Hypertension Sister   . Diabetes Brother   . Hypertension Brother   . Cervical cancer Maternal Aunt   . Uterine cancer Maternal Grandmother   . Breast cancer Maternal Grandmother   . Diabetes Sister   . Hypertension Sister   . Breast cancer Maternal Aunt 58      Review of systems complete and found to be negative unless listed above      PHYSICAL EXAM  General: Well developed, well nourished, in no acute distress HEENT:  Normocephalic and atramatic Neck:  No JVD.  Lungs: Clear bilaterally to auscultation and percussion. Heart: HRRR . Normal S1 and S2 without gallops or murmurs.  Abdomen: Bowel sounds are positive, abdomen soft and non-tender  Msk:  Back normal, normal gait. Normal strength and tone for age. Extremities: No clubbing, cyanosis or edema.   Neuro: Alert and oriented X 3. Psych:  Good affect, responds appropriately  Labs:   Lab Results  Component Value Date   WBC 11.3 (H)  04/01/2020   HGB 13.5 04/01/2020   HCT 40.3 04/01/2020   MCV 92.2 04/01/2020   PLT 244 04/01/2020    Recent Labs  Lab 04/01/20 1700  NA 138  K 3.7  CL 102  CO2 26  BUN 14  CREATININE 0.79  CALCIUM 9.5  GLUCOSE 145*   No results found for: CKTOTAL, CKMB, CKMBINDEX, TROPONINI  Lab Results  Component  Value Date   CHOL 250 (A) 07/10/2018   Lab Results  Component Value Date   HDL 31 (A) 07/10/2018   Lab Results  Component Value Date   LDLCALC 144 07/10/2018   Lab Results  Component Value Date   TRIG 375 (A) 07/10/2018   No results found for: CHOLHDL No results found for: LDLDIRECT    Radiology: Chest x-ray essentially unremarkable  EKG: Normal sinus rhythm diffuse ST segment depression inferior anteriorly laterally appears to be a possible posterior infarct rate of around 80  ASSESSMENT AND PLAN:  STEMI posterior Diabetes Hypertension Hyperlipidemia Thyroid disease Abnormal EKG . Plan Agree with treatment evaluation for possible STEMI Start anticoagulation with heparin and aspirin Recommend cardiac cath possible PCI and stent Recommend statin therapy possibly with Crestor 40 mg daily Consider adding ACE inhibitor post MI as well as hypertension and renal protection with diabetes Continue low-dose beta-blocker therapy After stent will maintain aspirin and Brilinta for at least 1 year Consider echocardiogram for assessment evaluation from a STEMI Consult hospitalist to admit the patient and treat comorbid disease diabetes hypertension hyperlipidemia We will refer the patient to cardiac rehab  Signed: Yolonda Kida MD 04/01/2020, 7:55 PM

## 2020-04-01 NOTE — H&P (Signed)
History and Physical    Melissa Lamb [id number] DOB: 30-Dec-1960 DOA: 04/01/2020  PCP: [name], MD   Patient coming from: Home  I have personally briefly reviewed patient's old medical records in [place]  Chief Complaint: Shortness of breath chest pain, STEMI status post PCI  HPI: Melissa Lamb is a 59 y.o. female with medical history significant for diabetes, hypertension and hyperlipidemia who was in her usual state of health until 3 days ago when she started experiencing chest heaviness associated with shortness of breath that became acutely worse on the day of arrival.  Work-up was consistent with STEMI with ST depression in anterior leads and troponin of 9,616.  She was seen by [name] and taken immediately to the Cath Lab where she underwent stent angioplasty of the proximal LAD and the circumflex which was 100% occluded.  Post catheterization, patient appears uncomfortable.  Remains with some minimal vague discomfort in her chest.  Denies shortness of breath nausea, palpitations or lightheadedness. Review of Systems: As per HPI otherwise 10 point review of systems negative.    Past Medical History:  Diagnosis Date  . Diabetes mellitus without complication ([facility])   . History of chickenpox   . Hyperlipidemia   . Hypertension   . Thyroid cancer [facility])     Past Surgical History:  Procedure Laterality Date  . ABDOMINAL HYSTERECTOMY  2014   has both ovaries still.  . APPENDECTOMY  2019  . BREAST BIOPSY Right    benign-seed was put in to mark it  . BREAST REDUCTION SURGERY    . CHOLECYSTECTOMY  2019  . REDUCTION MAMMAPLASTY    . THYROIDECTOMY  2014   papillary well-differentiated lymph nodes.  . TONSILLECTOMY AND ADENOIDECTOMY       reports that she has never smoked. She has never used smokeless tobacco. She reports current alcohol use. She reports that she does not use drugs.  No Known Allergies  Family History  Problem Relation Age of  Onset  . Diabetes Mother   . Hyperlipidemia Mother   . Hypertension Mother   . Hyperlipidemia Father   . Heart disease Father   . Heart attack Father 6       had defibrillator put in  . Diabetes Sister   . Hypertension Sister   . Diabetes Brother   . Hypertension Brother   . Cervical cancer Maternal Aunt   . Uterine cancer Maternal Grandmother   . Breast cancer Maternal Grandmother   . Diabetes Sister   . Hypertension Sister   . Breast cancer Maternal Aunt 58     Prior to Admission medications   Medication Sig Start Date End Date Taking? Authorizing Provider  amLODipine (NORVASC) 5 MG tablet Take 1 tablet (5 mg total) by mouth daily. 10/20/19  Yes [name], MD  Cholecalciferol (VITAMIN D-1000 MAX ST) 25 MCG (1000 UT) tablet Take 2,000 Units by mouth daily.    Yes [provider]  fenofibrate 160 MG tablet Take 1 tablet (160 mg total) by mouth daily. 10/20/19  Yes [name], MD  glipiZIDE (GLUCOTROL) 5 MG tablet Take 7.5 mg by mouth daily. 03/10/20  Yes [provider]  levothyroxine (SYNTHROID) 88 MCG tablet Take 88 mcg by mouth daily.    Yes [provider]  metFORMIN (GLUCOPHAGE) 500 MG tablet Take 500 mg by mouth 2 (two) times daily. 03/10/20  Yes [provider]  metoprolol succinate (TOPROL-XL) 25 MG 24 hr tablet Take 1 tablet (25  mg total) by mouth daily. 10/20/19  Yes [name], MD    Physical Exam: Vitals:   04/01/20 1755 04/01/20 1756 04/01/20 1800 04/01/20 2000  BP:   (!) 207/104 104/62  Pulse:      Resp: 20 12 17    Temp:      TempSrc:      SpO2:    98%  Weight:      Height:         Vitals:   04/01/20 1755 04/01/20 1756 04/01/20 1800 04/01/20 2000  BP:   (!) 207/104 104/62  Pulse:      Resp: 20 12 17    Temp:      TempSrc:      SpO2:    98%  Weight:      Height:        Constitutional: Alert and awake, oriented x3, not in any acute distress. Eyes: PERLA, EOMI, irises appear normal, anicteric sclera,    ENMT: external ears and nose appear normal, normal hearing             Lips appears normal, oropharynx mucosa, tongue, posterior pharynx appear normal  Neck: neck appears normal, no masses, normal ROM, no thyromegaly, no JVD  CVS: S1-S2 clear, no murmur rubs or gallops,  , no carotid bruits, pedal pulses palpable, No LE edema Respiratory:  clear to auscultation bilaterally, no wheezing, rales or rhonchi. Respiratory effort normal. No accessory muscle use.  Abdomen: soft nontender, nondistended, normal bowel sounds, no hepatosplenomegaly, no hernias Musculoskeletal: : no cyanosis, clubbing , no contractures or atrophy Neuro: Cranial nerves II-XII intact, sensation, reflexes normal, strength Psych: judgement and insight appear normal, stable mood and affect,  Skin: no rashes or lesions or ulcers, no induration or nodules   Labs on Admission: I have personally reviewed following labs and imaging studies  CBC: Recent Labs  Lab 04/01/20 1700  WBC 11.3*  HGB 13.5  HCT 40.3  MCV 92.2  PLT [id number]   Basic Metabolic Panel: Recent Labs  Lab 04/01/20 1700  NA 138  K 3.7  CL 102  CO2 26  GLUCOSE 145*  BUN 14  CREATININE 0.79  CALCIUM 9.5   GFR: Estimated Creatinine Clearance: 76.4 mL/min (by C-G formula based on SCr of 0.79 mg/dL). Liver Function Tests: No results for input(s): AST, ALT, ALKPHOS, BILITOT, PROT, ALBUMIN in the last 168 hours. No results for input(s): LIPASE, AMYLASE in the last 168 hours. No results for input(s): AMMONIA in the last 168 hours. Coagulation Profile: No results for input(s): INR, PROTIME in the last 168 hours. Cardiac Enzymes: No results for input(s): CKTOTAL, CKMB, CKMBINDEX, TROPONINI in the last 168 hours. BNP (last 3 results) No results for input(s): PROBNP in the last 8760 hours. HbA1C: Recent Labs    04/01/20 1742  HGBA1C 6.0*   CBG: Recent Labs  Lab 04/01/20 2015  GLUCAP 167*   Lipid Profile: No results for input(s): CHOL, HDL,  LDLCALC, TRIG, CHOLHDL, LDLDIRECT in the last 72 hours. Thyroid Function Tests: No results for input(s): TSH, T4TOTAL, FREET4, T3FREE, THYROIDAB in the last 72 hours. Anemia Panel: No results for input(s): VITAMINB12, FOLATE, FERRITIN, TIBC, IRON, RETICCTPCT in the last 72 hours. Urine analysis: No results found for: COLORURINE, APPEARANCEUR, LABSPEC, PHURINE, GLUCOSEU, HGBUR, BILIRUBINUR, KETONESUR, PROTEINUR, UROBILINOGEN, NITRITE, LEUKOCYTESUR  Radiological Exams on Admission: No results found.  EKG: Independently reviewed.   Assessment/Plan Principal Problem:   STEMI (ST elevation myocardial infarction) ([place])   Status post percutaneous transluminal angioplasty (PTA) with  stent placement -Patient presented with a 2-day history of intermittent typical chest pain associated with shortness of breath, with anterior ST depression and troponin of over 9000. -She is status post PCI to the LAD and circumflex by [name] -Continue orders per [name] to include aspirin, Crestor, lisinopril, ticagrelor.  Resume home metoprolol -Official cardiology consult placed    Essential hypertension -Currently on lisinopril and labetalol as needed as ordered    Type 2 diabetes mellitus with hyperlipidemia ([facility]) -Insulin sliding scale coverage.  Hold home Metformin -Hemoglobin A1c was 6    Post-surgical hypothyroidism -Continue levothyroxine     DVT prophylaxis: Heparin Code Status: full code  Family Communication:  none  Disposition Plan: Back to previous home environment Consults called: Cardiology, [name]  status: Inpatient as patient will be in stepdown    Athena Masse MD [facility]     04/01/2020, 8:40 PM

## 2020-04-01 NOTE — ED Provider Notes (Signed)
[facility] Emergency Department Provider Note    First MD Initiated Contact with Patient 04/01/20 1702     (approximate)  I have reviewed the triage vital signs and the nursing notes.   HISTORY  Chief Complaint Shortness of Breath    HPI Melissa Lamb is a 59 y.o. female with the below listed past medical history presents to the ER for worsening chest pressure feeling like someone is sitting on her chest for the past 24 hours.  Is been having some exertional discomfort and dyspnea over the past several days.  States she is also under quite a bit of stress at home and was attributing most of her symptoms to that but has been exercising today started having some chest pain with discomfort going to her left shoulder and up her right jaw so she came to the ER.  Denies any diaphoresis or clamminess.  States that pain is mild right now.    Past Medical History:  Diagnosis Date  . Diabetes mellitus without complication ([place])   . History of chickenpox   . Hyperlipidemia   . Hypertension   . Thyroid cancer ([place])    Family History  Problem Relation Age of Onset  . Diabetes Mother   . Hyperlipidemia Mother   . Hypertension Mother   . Hyperlipidemia Father   . Heart disease Father   . Heart attack Father 70       had defibrillator put in  . Diabetes Sister   . Hypertension Sister   . Diabetes Brother   . Hypertension Brother   . Cervical cancer Maternal Aunt   . Uterine cancer Maternal Grandmother   . Breast cancer Maternal Grandmother   . Diabetes Sister   . Hypertension Sister   . Breast cancer Maternal Aunt 58   Past Surgical History:  Procedure Laterality Date  . ABDOMINAL HYSTERECTOMY  2014   has both ovaries still.  . APPENDECTOMY  2019  . BREAST BIOPSY Right    benign-seed was put in to mark it  . BREAST REDUCTION SURGERY    . CHOLECYSTECTOMY  2019  . REDUCTION MAMMAPLASTY    . THYROIDECTOMY  2014   papillary well-differentiated  lymph nodes.  . TONSILLECTOMY AND ADENOIDECTOMY     Patient Active Problem List   Diagnosis Date Noted  . Itchy eyes 02/22/2020  . Diabetes mellitus without complication ([place]) [id number]  . Essential hypertension 10/20/2019  . Hyperlipidemia associated with type 2 diabetes mellitus ([place]) 10/20/2019  . Post-surgical hypothyroidism 10/20/2019  . Abnormal peripheral vision of left eye 12/02/2018  . NAION (non-arteritic anterior ischemic optic neuropathy), left eye 12/02/2018  . Tear of medial meniscus of knee 11/16/2018      Prior to Admission medications   Medication Sig Start Date End Date Taking? Authorizing Provider  amLODipine (NORVASC) 5 MG tablet Take 1 tablet (5 mg total) by mouth daily. 10/20/19  Yes [name], MD  Cholecalciferol (VITAMIN D-1000 MAX ST) 25 MCG (1000 UT) tablet Take 2,000 Units by mouth daily.    Yes [provider]  fenofibrate 160 MG tablet Take 1 tablet (160 mg total) by mouth daily. 10/20/19  Yes [name], MD  glipiZIDE (GLUCOTROL) 5 MG tablet Take 7.5 mg by mouth daily. 03/10/20  Yes [provider]  levothyroxine (SYNTHROID) 88 MCG tablet Take 88 mcg by mouth daily.    Yes [provider]  metFORMIN (GLUCOPHAGE) 500 MG tablet Take 500 mg by mouth 2 (two) times  daily. 03/10/20  Yes [provider]  metoprolol succinate (TOPROL-XL) 25 MG 24 hr tablet Take 1 tablet (25 mg total) by mouth daily. 10/20/19  Yes [name], MD    Allergies Patient has no known allergies.    Social History Social History   Tobacco Use  . Smoking status: Never Smoker  . Smokeless tobacco: Never Used  Substance Use Topics  . Alcohol use: Yes    Comment: rare  . Drug use: Never    Review of Systems Patient denies headaches, rhinorrhea, blurry vision, numbness, shortness of breath, chest pain, edema, cough, abdominal pain, nausea, vomiting, diarrhea, dysuria, fevers, rashes or hallucinations unless otherwise stated above  in HPI. ____________________________________________   PHYSICAL EXAM:  VITAL SIGNS: Vitals:   04/01/20 1756 04/01/20 1800  BP:  (!) 207/104  Pulse:    Resp: 12 17  Temp:    SpO2:      Constitutional: Alert and oriented.  Eyes: Conjunctivae are normal.  Head: Atraumatic. Nose: No congestion/rhinnorhea. Mouth/Throat: Mucous membranes are moist.   Neck: No stridor. Painless ROM.  Cardiovascular: Normal rate, regular rhythm. Grossly normal heart sounds.  Good peripheral circulation. Respiratory: Normal respiratory effort.  No retractions. Lungs CTAB. Gastrointestinal: Soft and nontender. No distention. No abdominal bruits. No CVA tenderness. Genitourinary:  Musculoskeletal: No lower extremity tenderness nor edema.  No joint effusions. Neurologic:  Normal speech and language. No gross focal neurologic deficits are appreciated. No facial droop Skin:  Skin is warm, dry and intact. No rash noted. Psychiatric: Mood and affect are normal. Speech and behavior are normal.  ____________________________________________   LABS (all labs ordered are listed, but only abnormal results are displayed)  Results for orders placed or performed during the hospital encounter of 04/01/20 (from the past 24 hour(s))  Basic metabolic panel     Status: Abnormal   Collection Time: 04/01/20  5:00 PM  Result Value Ref Range   Sodium 138 135 - 145 mmol/L   Potassium 3.7 3.5 - 5.1 mmol/L   Chloride 102 98 - 111 mmol/L   CO2 26 22 - 32 mmol/L   Glucose, Bld 145 (H) 70 - 99 mg/dL   BUN 14 6 - 20 mg/dL   Creatinine, Ser 0.79 0.44 - 1.00 mg/dL   Calcium 9.5 8.9 - 10.3 mg/dL   GFR calc non Af Amer >60 >60 mL/min   GFR calc Af Amer >60 >60 mL/min   Anion gap 10 5 - 15  CBC     Status: Abnormal   Collection Time: 04/01/20  5:00 PM  Result Value Ref Range   WBC 11.3 (H) 4.0 - 10.5 K/uL   RBC 4.37 3.87 - 5.11 MIL/uL   Hemoglobin 13.5 12.0 - 15.0 g/dL   HCT 40.3 36.0 - 46.0 %   MCV 92.2 80.0 - 100.0 fL     MCH 30.9 26.0 - 34.0 pg   MCHC 33.5 30.0 - 36.0 g/dL   RDW 11.9 11.5 - 15.5 %   Platelets 244 150 - 400 K/uL   nRBC 0.0 0.0 - 0.2 %  Troponin I (High Sensitivity)     Status: Abnormal   Collection Time: 04/01/20  5:00 PM  Result Value Ref Range   Troponin I (High Sensitivity) 9,616 (HH) <18 ng/L   ____________________________________________  EKG My review and personal interpretation at Time: 16:53   Indication: chest pain  Rate: 75  Rhythm: sinus Axis: normal Other: inferior st depressions, no stemi criteria, abnl ekg   My review  and personal interpretation at Time: 17:30 Indication: chest pain  Rate: 75  Rhythm: sinus Axis: normal Other: posterior leads concerning for stemi ____________________________________________  RADIOLOGY  I personally reviewed all radiographic images ordered to evaluate for the above acute complaints and reviewed radiology reports and findings.  These findings were personally discussed with the patient.  Please see medical record for radiology report.  ____________________________________________   PROCEDURES  Procedure(s) performed:  .Critical Care Performed by: Merlyn Lot, MD Authorized by: Merlyn Lot, MD   Critical care provider statement:    Critical care time (minutes):  35   Critical care time was exclusive of:  Separately billable procedures and treating other patients   Critical care was necessary to treat or prevent imminent or life-threatening deterioration of the following conditions:  Cardiac failure   Critical care was time spent personally by me on the following activities:  Development of treatment plan with patient or surrogate, discussions with consultants, evaluation of patient's response to treatment, examination of patient, obtaining history from patient or surrogate, ordering and performing treatments and interventions, ordering and review of laboratory studies, ordering and review of radiographic studies, pulse  oximetry, re-evaluation of patient's condition and review of old charts      Critical Care performed: yes ____________________________________________   INITIAL IMPRESSION / [place] / ED COURSE  Pertinent labs & imaging results that were available during my care of the patient were reviewed by me and considered in my medical decision making (see chart for details).   DDX: ACS, pericarditis, pe, dissection, pna, bronchitis, costochondritis   Melissa Lamb is a 59 y.o. who presents to the ED with symptoms as described above.  Certainly concerning for angina with ischemic appearing initial EKG.  Patient nontoxic-appearing but is hypertensive complaining of chest pain and pressure.  Given ischemic pattern posterior EKG obtained does show STEMI criteria.  I discussed case in consultation with [name] of cardiology who agrees with plan for calling STEMI and taken patient to cardiac Cath Lab did request heparinization and agrees with plan for nitrates aspirin pain control.  Clinical Course as of Apr 02 1815  Fri Apr 01, 2020  [id number] Noted significant troponin elevation.  Have ordered nitro for blood pressure.  [name] has evaluated patient bedside.  Patient to be taken urgently to cathlab.   [PR]    Clinical Course User Index [PR] Merlyn Lot, MD    The patient was evaluated in Emergency Department today for the symptoms described in the history of present illness. He/she was evaluated in the context of the global COVID-19 pandemic, which necessitated consideration that the patient might be at risk for infection with the SARS-CoV-2 virus that causes COVID-19. Institutional protocols and algorithms that pertain to the evaluation of patients at risk for COVID-19 are in a state of rapid change based on information released by regulatory bodies including the CDC and federal and state organizations. These policies and algorithms were followed during the patient's care in  the ED.  As part of my medical decision making, I reviewed the following data within the [place] notes reviewed and incorporated, Labs reviewed, notes from prior ED visits and [place] Controlled Substance Database   ____________________________________________   FINAL CLINICAL IMPRESSION(S) / ED DIAGNOSES  Final diagnoses:  Chest pain      NEW MEDICATIONS STARTED DURING THIS VISIT:  New Prescriptions   No medications on file     Note:  This document was prepared using Dragon voice  recognition software and may include unintentional dictation errors.    Merlyn Lot, MD 04/01/20 1816

## 2020-04-01 NOTE — Progress Notes (Signed)
Chaplain offered prayer ministry of presence at patient door way as staff provided care, no family members were present. Chaplain remained in ED until patient was taken to Cath lab.

## 2020-04-01 NOTE — ED Notes (Signed)
Pt taken to cath lab at this time .

## 2020-04-01 NOTE — ED Triage Notes (Signed)
Pt has had SHOB over last month and half since covid shots.  Pt has been under lot of stress since losing husband in august but does not feel this is related.  C/o heavy feeling in chest.  Last 2 days breathing has felt worse.  Currently having mild chest pressure.  Reports blood sugar, HR, and BP have been up and down for past 2 days.

## 2020-04-01 NOTE — ED Notes (Signed)
CODE  STEMI  CALLED  TO  Karen Chafe

## 2020-04-01 NOTE — ED Notes (Signed)
Pt changed into gown and attached to code cart

## 2020-04-01 NOTE — ED Notes (Signed)
Pt c/o central CP that started two days ago. Pt states she has palpitations on and off, and states she "felt off." Pt states she's been under a lot of stress due to husband passing recently.  Pt is AOX4, NAD noted, skin is warm and pink. Radial pulses 2+ bilaterally. Pt denies N/V/D, lightheadedness, jaw pain.

## 2020-04-02 DIAGNOSIS — I25110 Atherosclerotic heart disease of native coronary artery with unstable angina pectoris: Secondary | ICD-10-CM | POA: Diagnosis not present

## 2020-04-02 DIAGNOSIS — I2102 ST elevation (STEMI) myocardial infarction involving left anterior descending coronary artery: Secondary | ICD-10-CM | POA: Diagnosis not present

## 2020-04-02 DIAGNOSIS — I2119 ST elevation (STEMI) myocardial infarction involving other coronary artery of inferior wall: Secondary | ICD-10-CM | POA: Diagnosis not present

## 2020-04-02 LAB — CBC
HCT: 34.5 % — ABNORMAL LOW (ref 36.0–46.0)
Hemoglobin: 11.5 g/dL — ABNORMAL LOW (ref 12.0–15.0)
MCH: 30.7 pg (ref 26.0–34.0)
MCHC: 33.3 g/dL (ref 30.0–36.0)
MCV: 92.2 fL (ref 80.0–100.0)
Platelets: 238 10*3/uL (ref 150–400)
RBC: 3.74 MIL/uL — ABNORMAL LOW (ref 3.87–5.11)
RDW: 12 % (ref 11.5–15.5)
WBC: 9.2 10*3/uL (ref 4.0–10.5)
nRBC: 0 % (ref 0.0–0.2)

## 2020-04-02 LAB — BASIC METABOLIC PANEL
Anion gap: 7 (ref 5–15)
BUN: 12 mg/dL (ref 6–20)
CO2: 26 mmol/L (ref 22–32)
Calcium: 8.2 mg/dL — ABNORMAL LOW (ref 8.9–10.3)
Chloride: 105 mmol/L (ref 98–111)
Creatinine, Ser: 0.75 mg/dL (ref 0.44–1.00)
GFR calc Af Amer: >60 mL/min (ref >60)
GFR calc non Af Amer: >60 mL/min (ref >60)
Glucose, Bld: 197 mg/dL — ABNORMAL HIGH (ref 70–99)
Potassium: 3.8 mmol/L (ref 3.5–5.1)
Sodium: 138 mmol/L (ref 135–145)

## 2020-04-02 LAB — TROPONIN I (HIGH SENSITIVITY)
Troponin I (High Sensitivity): >27000 ng/L (ref <18)
Troponin I (High Sensitivity): 20443 ng/L (ref <18)

## 2020-04-02 LAB — LIPID PANEL
Cholesterol: 182 mg/dL (ref 0–200)
HDL: 29 mg/dL — ABNORMAL LOW (ref >40)
LDL Cholesterol: 96 mg/dL (ref 0–99)
Total CHOL/HDL Ratio: 6.3 RATIO
Triglycerides: 284 mg/dL — ABNORMAL HIGH (ref <150)
VLDL: 57 mg/dL — ABNORMAL HIGH (ref 0–40)

## 2020-04-02 LAB — GLUCOSE, CAPILLARY
Glucose-Capillary: 175 mg/dL — ABNORMAL HIGH (ref 70–99)
Glucose-Capillary: 222 mg/dL — ABNORMAL HIGH (ref 70–99)
Glucose-Capillary: 223 mg/dL — ABNORMAL HIGH (ref 70–99)
Glucose-Capillary: 148 mg/dL — ABNORMAL HIGH (ref 70–99)

## 2020-04-02 MED ORDER — ENOXAPARIN SODIUM 40 MG/0.4ML ~~LOC~~ SOLN
40.0000 mg | SUBCUTANEOUS | Status: DC
  Administered 2020-04-02: 40 mg via SUBCUTANEOUS
  Filled 2020-04-02: qty 0.4

## 2020-04-02 NOTE — Progress Notes (Signed)
Patient alert and oriented. On room air, no complaints of pain. Tolerating diet, ambulating with no complication. Groin site intact with no bleeding. Patient being transferred to 2A. Reported to Amy RN.

## 2020-04-02 NOTE — Progress Notes (Signed)
[facility] Cardiology    SUBJECTIVE: Patient feels reasonably well no chest pain today and has recovered nicely from STEMI yesterday no significant groin issues patient is still lying in bed no evidence of bleeding no shortness of breath good appetite slept well   Vitals:   04/02/20 0300 04/02/20 0400 04/02/20 0500 04/02/20 0600  BP: 107/60 108/60 (!) 115/59 113/63  Pulse: 66 66 68 76  Resp: 19 18 14  (!) 21  Temp:      TempSrc:      SpO2: 95% 95% 95% 96%  Weight:      Height:         Intake/Output Summary (Last 24 hours) at 04/02/2020 [id number] Last data filed at 04/01/2020 2000 Gross per 24 hour  Intake -  Output 1000 ml  Net -1000 ml      PHYSICAL EXAM  General: Well developed, well nourished, in no acute distress HEENT:  Normocephalic and atramatic Neck:  No JVD.  Lungs: Clear bilaterally to auscultation and percussion. Heart: HRRR . Normal S1 and S2 without gallops or murmurs.  Abdomen: Bowel sounds are positive, abdomen soft and non-tender  Msk:  Back normal, normal gait. Normal strength and tone for age. Extremities: No clubbing, cyanosis or edema.   Neuro: Alert and oriented X 3. Psych:  Good affect, responds appropriately   LABS: Basic Metabolic Panel: Recent Labs    04/01/20 1700 04/02/20 0443  NA 138 138  K 3.7 3.8  CL 102 105  CO2 26 26  GLUCOSE 145* 197*  BUN 14 12  CREATININE 0.79 0.75  CALCIUM 9.5 8.2*   Liver Function Tests: No results for input(s): AST, ALT, ALKPHOS, BILITOT, PROT, ALBUMIN in the last 72 hours. No results for input(s): LIPASE, AMYLASE in the last 72 hours. CBC: Recent Labs    04/01/20 1700 04/02/20 0443  WBC 11.3* 9.2  HGB 13.5 11.5*  HCT 40.3 34.5*  MCV 92.2 92.2  PLT 244 238   Cardiac Enzymes: No results for input(s): CKTOTAL, CKMB, CKMBINDEX, TROPONINI in the last 72 hours. BNP: Invalid input(s): POCBNP D-Dimer: No results for input(s): DDIMER in the last 72 hours. Hemoglobin A1C: Recent Labs    04/01/20 1742   HGBA1C 6.0*   Fasting Lipid Panel: No results for input(s): CHOL, HDL, LDLCALC, TRIG, CHOLHDL, LDLDIRECT in the last 72 hours. Thyroid Function Tests: No results for input(s): TSH, T4TOTAL, T3FREE, THYROIDAB in the last 72 hours.  Invalid input(s): FREET3 Anemia Panel: No results for input(s): VITAMINB12, FOLATE, FERRITIN, TIBC, IRON, RETICCTPCT in the last 72 hours.  DG Chest Port 1 View  Result Date: 04/01/2020 CLINICAL DATA:  Shortness of breath over month and a half since COVID shots. EXAM: PORTABLE CHEST 1 VIEW COMPARISON:  None FINDINGS: Cardiomediastinal contours and hilar structures are unremarkable. Lungs are clear. Visualized skeletal structures are unremarkable. Pacer defibrillator pad overlies the LEFT chest. IMPRESSION: No acute cardiopulmonary disease. Electronically Signed   By: Zetta Bills M.D.   On: 04/01/2020 18:48     Echo pending  TELEMETRY: Normal sinus rhythm nonspecific T2 changes:  ASSESSMENT AND PLAN:  Principal Problem:   STEMI (ST elevation myocardial infarction) ([place]) Active Problems:   Essential hypertension   Type 2 diabetes mellitus with hyperlipidemia (H[facility])   Post-surgical hypothyroidism   Status post percutaneous transluminal angioplasty (PTA) with stent placement   S/P angioplasty with stent    Plan Postop day 1 from STEMI posterior Status post PCI and stent circumflex and LAD DES Hypertension start ACE inhibitor beta-blocker  hold amlodipine for now Hyperlipidemia started on Crestor therapy for lipid management continue fenofibrate Diabetes continue management and control will hold metformin for 24 to 48 hours Thyroid disease consider endocrine evaluation probably as an outpatient Increase activity out of bed to chair increase ambulation Transfer to telemetry for increased activity Continue aspirin Brilinta at least 12 months Consider discharge and 24 to 48 hours depending on her progress Recommend follow-up with additional troponins  Echocardiogram today for assessment of left ventricular function and wall motion abnormalities Follow-up EKG postop day 1   Yolonda Kida, MD 04/02/2020 7:28 AM

## 2020-04-02 NOTE — Progress Notes (Signed)
Patient ambulated around unit with no complications. Up in chair eating breakfast. Groin site dry and intact. Patient has no complaints of pain.

## 2020-04-02 NOTE — Plan of Care (Signed)

## 2020-04-02 NOTE — Progress Notes (Signed)
PROGRESS NOTE    Melissa Lamb  [id number] DOB: Mar 04, 1961 DOA: 04/01/2020 PCP: [name], MD    Assessment & Plan:   Principal Problem:   STEMI (ST elevation myocardial infarction) [facility]) Active Problems:   Essential hypertension   Type 2 diabetes mellitus with hyperlipidemia ([place])   Post-surgical hypothyroidism   Status post percutaneous transluminal angioplasty (PTA) with stent placement   S/P angioplasty with stent    Melissa Lamb is a 59 y.o. female with medical history significant for diabetes, hypertension and hyperlipidemia who was in her usual state of health until 3 days ago when she started experiencing chest heaviness associated with shortness of breath that became acutely worse on the day of arrival.  Work-up was consistent with STEMI.   STEMI posterior Status post PCI and stent circumflex and LAD DES -Patient presented with a 2-day history of intermittent typical chest pain associated with shortness of breath, with anterior ST depression and troponin of over 9000. -She is status post PCI to the LAD and circumflex by [name] -Continue orders per [name] to include aspirin, Crestor, ticagrelor.   --continue home metoprolol -TTE     Essential hypertension -continue home Toprol    Type 2 diabetes mellitus with hyperlipidemia (HCC) -Insulin sliding scale coverage.  Hold home Metformin -Hemoglobin A1c was 6    Post-surgical hypothyroidism -Continue levothyroxine    DVT prophylaxis: Lovenox SQ Code Status: Full code  Family Communication:  Status is: inpatient Dispo:   The patient is from: home Anticipated d/c is to: home Anticipated d/c date is: 1-2 days Patient currently is not medically stable to d/c due to: cardiology not cleared for discharge, need TTE   Subjective and Interval History:  Pt reported still some discomfort with deep breathing, but the chest pressure has resolved.  No fever, dyspnea, abdominal pain,  N/V/D, dysuria, increased swelling.   Objective: Vitals:   04/02/20 0700 04/02/20 0933 04/02/20 1317 04/02/20 1417  BP: (!) 116/58 (!) 96/51 109/61 124/66  Pulse: 73 85 89 71  Resp: 16 19 20 17   Temp: 98.3 F (36.8 C)   (!) 97.5 F (36.4 C)  TempSrc:    Oral  SpO2: 97% 96% 100% 100%  Weight:      Height:        Intake/Output Summary (Last 24 hours) at 04/02/2020 1626 Last data filed at 04/01/2020 2000 Gross per 24 hour  Intake -  Output 1000 ml  Net -1000 ml   Filed Weights   04/01/20 1650  Weight: 66.2 kg    Examination:  Constitutional: NAD, AAOx3 HEENT: conjunctivae and lids normal, EOMI CV: RRR no M,R,G. Distal pulses +2.  No cyanosis.   RESP: CTA B/L, normal respiratory effort  GI: +BS, NTND Extremities: No effusions, edema, or tenderness in BLE MSK: normal ROM and strength, no joint enlargement or tenderness of both UE and LE SKIN: warm, dry and intact Neuro: II - XII grossly intact.  Sensation intact Psych: Normal mood and affect.  Appropriate judgement and reason   Data Reviewed: I have personally reviewed following labs and imaging studies  CBC: Recent Labs  Lab 04/01/20 1700 04/02/20 0443  WBC 11.3* 9.2  HGB 13.5 11.5*  HCT 40.3 34.5*  MCV 92.2 92.2  PLT 244 [id number]   Basic Metabolic Panel: Recent Labs  Lab 04/01/20 1700 04/02/20 0443  NA 138 138  K 3.7 3.8  CL 102 105  CO2 26 26  GLUCOSE 145* 197*  BUN 14 12  CREATININE  0.79 0.75  CALCIUM 9.5 8.2*   GFR: Estimated Creatinine Clearance: 76.4 mL/min (by C-G formula based on SCr of 0.75 mg/dL). Liver Function Tests: No results for input(s): AST, ALT, ALKPHOS, BILITOT, PROT, ALBUMIN in the last 168 hours. No results for input(s): LIPASE, AMYLASE in the last 168 hours. No results for input(s): AMMONIA in the last 168 hours. Coagulation Profile: Recent Labs  Lab 04/01/20 2018  INR 1.5*   Cardiac Enzymes: No results for input(s): CKTOTAL, CKMB, CKMBINDEX, TROPONINI in the last 168  hours. BNP (last 3 results) No results for input(s): PROBNP in the last 8760 hours. HbA1C: Recent Labs    04/01/20 1742  HGBA1C 6.0*   CBG: Recent Labs  Lab 04/01/20 2015 04/02/20 0737 04/02/20 1214  GLUCAP 167* 175* 222*   Lipid Profile: Recent Labs    04/02/20 0841  CHOL 182  HDL 29*  LDLCALC 96  TRIG 284*  CHOLHDL 6.3   Thyroid Function Tests: No results for input(s): TSH, T4TOTAL, FREET4, T3FREE, THYROIDAB in the last 72 hours. Anemia Panel: No results for input(s): VITAMINB12, FOLATE, FERRITIN, TIBC, IRON, RETICCTPCT in the last 72 hours. Sepsis Labs: No results for input(s): PROCALCITON, LATICACIDVEN in the last 168 hours.  Recent Results (from the past 240 hour(s))  SARS Coronavirus 2 by RT PCR (hospital order, performed in [facility] hospital lab) Nasopharyngeal Nasopharyngeal Swab     Status: None   Collection Time: 04/01/20  5:42 PM   Specimen: Nasopharyngeal Swab  Result Value Ref Range Status   SARS Coronavirus 2 NEGATIVE NEGATIVE Final    Comment: (NOTE) SARS-CoV-2 target nucleic acids are NOT DETECTED. The SARS-CoV-2 RNA is generally detectable in upper and lower respiratory specimens during the acute phase of infection. The lowest concentration of SARS-CoV-2 viral copies this assay can detect is 250 copies / mL. A negative result does not preclude SARS-CoV-2 infection and should not be used as the sole basis for treatment or other patient management decisions.  A negative result may occur with improper specimen collection / handling, submission of specimen other than nasopharyngeal swab, presence of viral mutation(s) within the areas targeted by this assay, and inadequate number of viral copies (<250 copies / mL). A negative result must be combined with clinical observations, patient history, and epidemiological information. Fact Sheet for Patients:   StrictlyIdeas.no Fact Sheet for Healthcare Providers:  BankingDealers.co.za This test is not yet approved or cleared  by the [place] FDA and has been authorized for detection and/or diagnosis of SARS-CoV-2 by FDA under an Emergency Use Authorization (EUA).  This EUA will remain in effect (meaning this test can be used) for the duration of the COVID-19 declaration under Section 564(b)(1) of the Act, 21 U.S.C. section 360bbb-3(b)(1), unless the authorization is terminated or revoked sooner. Performed at [facility], [address] [place], [place] [address]   MRSA PCR Screening     Status: None   Collection Time: 04/01/20  9:58 PM   Specimen: Nasopharyngeal  Result Value Ref Range Status   MRSA by PCR NEGATIVE NEGATIVE Final    Comment:        The GeneXpert MRSA Assay (FDA approved for NASAL specimens only), is one component of a comprehensive MRSA colonization surveillance program. It is not intended to diagnose MRSA infection nor to guide or monitor treatment for MRSA infections. Performed at [facility], [address] [place], [place] [address]       Radiology Studies: [facility] Chest [facility] 1 View  Result  Date: 04/01/2020 CLINICAL DATA:  Shortness of breath over month and a half since COVID shots. EXAM: PORTABLE CHEST 1 VIEW COMPARISON:  None FINDINGS: Cardiomediastinal contours and hilar structures are unremarkable. Lungs are clear. Visualized skeletal structures are unremarkable. Pacer defibrillator pad overlies the LEFT chest. IMPRESSION: No acute cardiopulmonary disease. Electronically Signed   By: Zetta Bills M.D.   On: 04/01/2020 18:48     Scheduled Meds: . aspirin  81 mg Oral Daily  . insulin aspart  0-9 Units Subcutaneous TID WC  . levothyroxine  88 mcg Oral Q0600  . metoprolol succinate  25 mg Oral Daily  . rosuvastatin  40 mg Oral Daily  . sodium chloride flush  3 mL Intravenous Q12H  . ticagrelor  90 mg Oral BID   Continuous Infusions: . sodium chloride 10 mL/hr  at 04/01/20 1800  . sodium chloride       LOS: 1 day     Enzo Bi, MD [facility] If 7PM-7AM, please contact night-coverage 04/02/2020, 4:26 PM

## 2020-04-02 NOTE — Progress Notes (Signed)
Patient ambulated around the icu unit for the second time. No complications noted.

## 2020-04-03 ENCOUNTER — Inpatient Hospital Stay
Admit: 2020-04-03 | Discharge: 2020-04-03 | Disposition: A | Discharge status: Home / Self Care | Payer: BC Managed Care – PPO | Attending: Hospitalist | Admitting: Hospitalist

## 2020-04-03 DIAGNOSIS — I2119 ST elevation (STEMI) myocardial infarction involving other coronary artery of inferior wall: Secondary | ICD-10-CM | POA: Diagnosis not present

## 2020-04-03 DIAGNOSIS — I25110 Atherosclerotic heart disease of native coronary artery with unstable angina pectoris: Secondary | ICD-10-CM | POA: Diagnosis not present

## 2020-04-03 DIAGNOSIS — I214 Non-ST elevation (NSTEMI) myocardial infarction: Secondary | ICD-10-CM | POA: Diagnosis not present

## 2020-04-03 DIAGNOSIS — I252 Old myocardial infarction: Secondary | ICD-10-CM

## 2020-04-03 LAB — GLUCOSE, CAPILLARY
Glucose-Capillary: 145 mg/dL — ABNORMAL HIGH (ref 70–99)
Glucose-Capillary: 209 mg/dL — ABNORMAL HIGH (ref 70–99)
Glucose-Capillary: 225 mg/dL — ABNORMAL HIGH (ref 70–99)

## 2020-04-03 LAB — MAGNESIUM: Magnesium: 2.1 mg/dL (ref 1.7–2.4)

## 2020-04-03 LAB — BASIC METABOLIC PANEL
Anion gap: 6 (ref 5–15)
BUN: 13 mg/dL (ref 6–20)
CO2: 30 mmol/L (ref 22–32)
Calcium: 8.4 mg/dL — ABNORMAL LOW (ref 8.9–10.3)
Chloride: 107 mmol/L (ref 98–111)
Creatinine, Ser: 0.85 mg/dL (ref 0.44–1.00)
GFR calc Af Amer: >60 mL/min (ref >60)
GFR calc non Af Amer: >60 mL/min (ref >60)
Glucose, Bld: 153 mg/dL — ABNORMAL HIGH (ref 70–99)
Potassium: 3.9 mmol/L (ref 3.5–5.1)
Sodium: 143 mmol/L (ref 135–145)

## 2020-04-03 LAB — ECHOCARDIOGRAM COMPLETE
Height: 68 in
Weight: 2302.4 oz

## 2020-04-03 LAB — CBC
HCT: 32.6 % — ABNORMAL LOW (ref 36.0–46.0)
Hemoglobin: 10.8 g/dL — ABNORMAL LOW (ref 12.0–15.0)
MCH: 31 pg (ref 26.0–34.0)
MCHC: 33.1 g/dL (ref 30.0–36.0)
MCV: 93.7 fL (ref 80.0–100.0)
Platelets: 189 10*3/uL (ref 150–400)
RBC: 3.48 MIL/uL — ABNORMAL LOW (ref 3.87–5.11)
RDW: 12.1 % (ref 11.5–15.5)
WBC: 5.2 10*3/uL (ref 4.0–10.5)
nRBC: 0 % (ref 0.0–0.2)

## 2020-04-03 LAB — HIV ANTIBODY (ROUTINE TESTING W REFLEX): HIV Screen 4th Generation wRfx: NONREACTIVE

## 2020-04-03 MED ORDER — CLOPIDOGREL BISULFATE 75 MG PO TABS
75.0000 mg | ORAL_TABLET | Freq: Every day | ORAL | 11 refills | Status: DC
Start: 2020-04-03 — End: 2020-04-06

## 2020-04-03 MED ORDER — ASPIRIN 81 MG PO CHEW: 81.0000 mg | CHEWABLE_TABLET | Freq: Every day | ORAL | Status: AC

## 2020-04-03 MED ORDER — TICAGRELOR 90 MG PO TABS: 90.0000 mg | ORAL_TABLET | Freq: Two times a day (BID) | ORAL | 11 refills | Status: AC

## 2020-04-03 MED ORDER — ROSUVASTATIN CALCIUM 40 MG PO TABS: 40.0000 mg | ORAL_TABLET | Freq: Every day | ORAL | 2 refills | Status: DC

## 2020-04-03 NOTE — Progress Notes (Addendum)
[facility]    SUBJECTIVE: Patient states to be done reasonably well no chest pain no shortness of breath groin has healed nicely she has been ambulating ready to go home.   Vitals:   04/02/20 1957 04/03/20 0512 04/03/20 0750 04/03/20 1111  BP: 114/70 103/63 124/83 106/65  Pulse: 82 76 91 88  Resp: 19 18 18 19   Temp: 99.1 F (37.3 C) 98.6 F (37 C)  98.7 F (37.1 C)  TempSrc: Oral Oral  Oral  SpO2: 96% 98% 100% 95%  Weight:  65.3 kg    Height:         Intake/Output Summary (Last 24 hours) at 04/03/2020 2301 Last data filed at 04/03/2020 1700 Gross per 24 hour  Intake 480 ml  Output --  Net 480 ml      PHYSICAL EXAM  General: Well developed, well nourished, in no acute distress HEENT:  Normocephalic and atramatic Neck:  No JVD.  Lungs: Clear bilaterally to auscultation and percussion. Heart: HRRR . Normal S1 and S2 without gallops or murmurs.  Abdomen: Bowel sounds are positive, abdomen soft and non-tender  Msk:  Back normal, normal gait. Normal strength and tone for age. Extremities: No clubbing, cyanosis or edema.   Neuro: Alert and oriented X 3. Psych:  Good affect, responds appropriately   LABS: Basic Metabolic Panel: Recent Labs    04/02/20 0443 04/03/20 0604  NA 138 143  K 3.8 3.9  CL 105 107  CO2 26 30  GLUCOSE 197* 153*  BUN 12 13  CREATININE 0.75 0.85  CALCIUM 8.2* 8.4*  MG  --  2.1   Liver Function Tests: No results for input(s): AST, ALT, ALKPHOS, BILITOT, PROT, ALBUMIN in the last 72 hours. No results for input(s): LIPASE, AMYLASE in the last 72 hours. CBC: Recent Labs    04/02/20 0443 04/03/20 0604  WBC 9.2 5.2  HGB 11.5* 10.8*  HCT 34.5* 32.6*  MCV 92.2 93.7  PLT 238 189   Cardiac Enzymes: No results for input(s): CKTOTAL, CKMB, CKMBINDEX, TROPONINI in the last 72 hours. BNP: Invalid input(s): POCBNP D-Dimer: No results for input(s): DDIMER in the last 72 hours. Hemoglobin A1C: Recent Labs    04/01/20 1742  HGBA1C  6.0*   Fasting Lipid Panel: Recent Labs    04/02/20 0841  CHOL 182  HDL 29*  LDLCALC 96  TRIG 284*  CHOLHDL 6.3   Thyroid Function Tests: No results for input(s): TSH, T4TOTAL, T3FREE, THYROIDAB in the last 72 hours.  Invalid input(s): FREET3 Anemia Panel: No results for input(s): VITAMINB12, FOLATE, FERRITIN, TIBC, IRON, RETICCTPCT in the last 72 hours.  ECHOCARDIOGRAM COMPLETE  Result Date: 04/03/2020    ECHOCARDIOGRAM REPORT   Patient Name:   Melissa Lamb Date of Exam: 04/03/2020 Medical Rec #:  [id number]         Height:       68.0 in Accession #:    [id number]        Weight:       143.9 lb Date of Birth:  1961-08-17         BSA:          1.777 m Patient Age:    59 years          BP:           103/63 mmHg Patient Gender: F                 HR:  89 bpm. Exam Location:  [facility] Procedure: 2D Echo Indications:     NSTEMI I21.4  History:         Patient has no prior history of Echocardiogram examinations.  Sonographer:     Arville Go RDCS Referring Phys:  [id number] [place] Diagnosing Phys: Yolonda Kida MD  Sonographer Comments: Suboptimal parasternal window and Technically difficult study due to poor echo windows. IMPRESSIONS  1. Left ventricular ejection fraction, by estimation, is 55 to 60%. The left ventricle has normal function. The left ventricle demonstrates regional wall motion abnormalities (see scoring diagram/findings for description). Left ventricular diastolic parameters are consistent with Grade I diastolic dysfunction (impaired relaxation).  2. Right ventricular systolic function is normal. The right ventricular size is normal.  3. The mitral valve is grossly normal. Trivial mitral valve regurgitation.  4. The aortic valve is grossly normal. Aortic valve regurgitation is trivial. FINDINGS  Left Ventricle: Left ventricular ejection fraction, by estimation, is 55 to 60%. The left ventricle has normal function. The left ventricle demonstrates regional wall motion  abnormalities. The left ventricular internal cavity size was normal in size. There is no left ventricular hypertrophy. Left ventricular diastolic parameters are consistent with Grade I diastolic dysfunction (impaired relaxation).  LV Wall Scoring: The anterior wall and mid inferolateral segment are hypokinetic. Right Ventricle: The right ventricular size is normal. No increase in right ventricular wall thickness. Right ventricular systolic function is normal. Left Atrium: Left atrial size was normal in size. Right Atrium: Right atrial size was normal in size. Pericardium: The pericardium was not assessed. Mitral Valve: The mitral valve is grossly normal. Trivial mitral valve regurgitation. Tricuspid Valve: The tricuspid valve is normal in structure. Tricuspid valve regurgitation is not demonstrated. Aortic Valve: The aortic valve is grossly normal. Aortic valve regurgitation is trivial. Aortic valve peak gradient measures 6.8 mmHg. Pulmonic Valve: The pulmonic valve was grossly normal. Pulmonic valve regurgitation is not visualized. Aorta: The aortic arch was not well visualized. IAS/Shunts: No atrial level shunt detected by color flow Doppler.  LEFT VENTRICLE PLAX 2D LVIDd:         3.89 cm  Diastology LVIDs:         2.50 cm  LV e' lateral:   3.81 cm/s LV PW:         1.42 cm  LV E/e' lateral: 17.8 LV IVS:        1.01 cm  LV e' medial:    5.11 cm/s LVOT diam:     1.80 cm  LV E/e' medial:  13.2 LV SV:         46 LV SV Index:   26 LVOT Area:     2.54 cm  RIGHT VENTRICLE RV Basal diam:  2.18 cm RV S prime:     14.60 cm/s TAPSE (M-mode): 1.9 cm LEFT ATRIUM             Index       RIGHT ATRIUM          Index LA diam:        3.90 cm 2.19 cm/m  RA Area:     7.74 cm LA Vol (A2C):   41.7 ml 23.47 ml/m RA Volume:   13.60 ml 7.65 ml/m LA Vol (A4C):   21.0 ml 11.82 ml/m LA Biplane Vol: 29.7 ml 16.71 ml/m  AORTIC VALVE AV Area (Vmax): 1.78 cm AV Vmax:        130.00 cm/s AV Peak Grad:   6.8 mmHg LVOT Vmax:  90.70 cm/s  LVOT Vmean:     59.400 cm/s LVOT VTI:       0.181 m  AORTA Ao Root diam: 2.90 cm MITRAL VALVE               TRICUSPID VALVE MV Area (PHT): 5.38 cm    TV Peak grad:   16.2 mmHg MV Decel Time: 141 msec    TV Vmax:        2.01 m/s MV E velocity: 67.70 cm/s MV A velocity: 87.00 cm/s  SHUNTS MV E/A ratio:  0.78        Systemic VTI:  0.18 m                            Systemic Diam: 1.80 cm Yasuko Lapage D Haru Shaff MD Electronically signed by Yolonda Kida MD Signature Date/Time: 04/03/2020/4:07:54 PM    Final      Echo normal overall left ventricular function ejection fraction around 55% with inferior lateral hypokinesis  TELEMETRY: Normal sinus rhythm rate of around 90 nonspecific ST-T wave changes:  ASSESSMENT AND PLAN:  Principal Problem:   STEMI (ST elevation myocardial infarction) ([place]) Active Problems:   Essential hypertension   Type 2 diabetes mellitus with hyperlipidemia (HCC)   Post-surgical hypothyroidism   Status post percutaneous transluminal angioplasty (PTA) with stent placement   S/P angioplasty with stent Mixed hyperlipidemia  PLAN STEMI status post postop day 3 status post PCI and stent x2 distal circumflex IRA and proximal LAD both with DES continue current therapy Continue aspirin Brilinta for at least 12 months Hypertension management beta-blocker ACE inhibitor Diabetes type 2 uncomplicated continue current therapy hold metformin for 24 to 48 hours Recommend Crestor fenofibrate for mixed hyperlipidemia Abnormal echocardiogram mild reduced wall motion changes  related to myocardial infarction continue current therapy Increase activity consider discharge home Follow-up with cardiology 1 to 2 weeks   Yolonda Kida, MD 04/03/2020 11:01 PM

## 2020-04-03 NOTE — TOC Progression Note (Signed)
Transition of Care [facility]) - Progression Note    Patient Details  Name: Melissa Lamb MRN: [id number] Date of Birth: [birth year]  Transition of Care [facility]) CM/SW Contact  Anselm Pancoast, RN Phone Number: 04/03/2020, 2:40 PM  Clinical Narrative:    Attempted to contact [employer] to confirm cost of medication however no weekend coverage at this time. [employer] will reopen on Monday. Best option is to call medication to patients pharmacy choice and have patient call TOC back if problems develop.         Expected Discharge Plan and Services                                                 Social Determinants of Health (SDOH) Interventions    Readmission Risk Interventions No flowsheet data found.

## 2020-04-03 NOTE — Discharge Summary (Signed)
Physician Discharge Summary   Melissa Lamb  female DOB: 02-11-1961  [id number]  PCP: [name], MD  Admit date: 04/01/2020 Discharge date: 04/03/2020  Admitted From: home Disposition:  home CODE STATUS: Full code  Discharge Instructions    AMB Referral to Cardiac Rehabilitation - Phase II   Complete by: As directed    Diagnosis: STEMI   After initial evaluation and assessments completed: Virtual Based Care may be provided alone or in conjunction with Phase 2 Cardiac Rehab based on patient barriers.: Yes   Discharge instructions   Complete by: As directed    You have had a heart attack and now have stent in your coronary artery.  Please take aspirin 81 and Brilinta together for at least a year, end date 04/03/2021.    If Brilinta is not affordable, you can take aspirin 81 and Plavix for a year.  I will provide a printed script for Plavix just in case you can't get Brilinta.   Dr. Enzo Bi - -   Increase activity slowly   Complete by: As directed        Hospital Course:  For full details, please see H&P, progress notes, consult notes and ancillary notes.  Briefly,  Melissa Lamb a 59 y.o.Caucasian femalewith medical history significant fordiabetes, hypertension and hyperlipidemia who was in her usual state of health until 3 days PTA when she started experiencing chest heaviness associated with shortness of breath that became acutely worse on the day of arrival. Work-up was consistent with STEMI.   STEMI posterior Status post PCI and stent circumflex and LAD DES Patient presented with a 2-day history of intermittent typical chest pain associated with shortness of breath, with anterior ST depression and troponin of over 9000.  She is status post PCI to the LAD and circumflex by [name].  Pt was started on Crestor, and will need to take aspirin 81 and ticagrelor for at least 1 year.  Pt continued on home metoprolol.   TTE showed LVEF 55-60%,  however did showed regional wall motion abnormalities and Grade I diastolic dysfunction.  Pt will follow up with cardiology as outpatient.  Essential hypertension continued home Toprol  Type 2 diabetes mellitus with hyperlipidemia (HCC) Hemoglobin A1c was 6.  Pt continued on home oral regimen.  Post-surgical hypothyroidism Continued levothyroxine   Discharge Diagnoses:  Principal Problem:   STEMI (ST elevation myocardial infarction) ([place]) Active Problems:   Essential hypertension   Type 2 diabetes mellitus with hyperlipidemia ([place])   Post-surgical hypothyroidism   Status post percutaneous transluminal angioplasty (PTA) with stent placement   S/P angioplasty with stent    Discharge Instructions:  Allergies as of 04/03/2020      Reactions   Lisinopril Tinitus, Cough      Medication List    STOP taking these medications   amLODipine 5 MG tablet Commonly known as: NORVASC     TAKE these medications   aspirin 81 MG chewable tablet Chew 1 tablet (81 mg total) by mouth daily. Need to take for at least a year. Start taking on: Apr 04, 2020   clopidogrel 75 MG tablet Commonly known as: Plavix Take 1 tablet (75 mg total) by mouth daily. Take Plavix with aspirain 81 mg ONLY if you are not taking Brilinta (if Brilinta is not affordable).   fenofibrate 160 MG tablet Take 1 tablet (160 mg total) by mouth daily.   glipiZIDE 5 MG tablet Commonly known as: GLUCOTROL Take 7.5 mg by mouth daily.  metFORMIN 500 MG tablet Commonly known as: GLUCOPHAGE Take 500 mg by mouth 2 (two) times daily.   metoprolol succinate 25 MG 24 hr tablet Commonly known as: TOPROL-XL Take 1 tablet (25 mg total) by mouth daily.   rosuvastatin 40 MG tablet Commonly known as: CRESTOR Take 1 tablet (40 mg total) by mouth daily. Start taking on: Apr 04, 2020   Synthroid 88 MCG tablet Generic drug: levothyroxine Take 88 mcg by mouth daily.   ticagrelor 90 MG Tabs tablet Commonly  known as: BRILINTA Take 1 tablet (90 mg total) by mouth 2 (two) times daily. Need to take this for at least a year.   Vitamin D-1000 Max St 25 MCG (1000 UT) tablet Generic drug: Cholecalciferol Take 2,000 Units by mouth daily.       Follow-up Information    [name], MD. Schedule an appointment as soon as possible for a visit in 1 week(s).   Specialty: Family Medicine Contact information: [place] [address] [phone]        Yolonda Kida, MD. Schedule an appointment as soon as possible for a visit in 1 week(s).   Specialties: Cardiology, Internal Medicine Contact information: [address] [place] [address] [phone]           Allergies  Allergen Reactions  . Lisinopril Tinitus and Cough     The results of significant diagnostics from this hospitalization (including imaging, microbiology, ancillary and laboratory) are listed below for reference.   Consultations:   Procedures/Studies: DG Chest Port 1 View  Result Date: 04/01/2020 CLINICAL DATA:  Shortness of breath over month and a half since COVID shots. EXAM: PORTABLE CHEST 1 VIEW COMPARISON:  None FINDINGS: Cardiomediastinal contours and hilar structures are unremarkable. Lungs are clear. Visualized skeletal structures are unremarkable. Pacer defibrillator pad overlies the LEFT chest. IMPRESSION: No acute cardiopulmonary disease. Electronically Signed   By: Zetta Bills M.D.   On: 04/01/2020 18:48   ECHOCARDIOGRAM COMPLETE  Result Date: 04/03/2020    ECHOCARDIOGRAM REPORT   Patient Name:   Melissa Lamb Date of Exam: 04/03/2020 Medical Rec #:  [id number]         Height:       68.0 in Accession #:    [id number]        Weight:       143.9 lb Date of Birth:  1961-03-25         BSA:          1.777 m Patient Age:    59 years          BP:           103/63 mmHg Patient Gender: F                 HR:           89 bpm. Exam Location:  [facility] Procedure: 2D Echo Indications:      NSTEMI I21.4  History:         Patient has no prior history of Echocardiogram examinations.  Sonographer:     Arville Go RDCS Referring Phys:  [id number] [place] Diagnosing Phys: Yolonda Kida MD  Sonographer Comments: Suboptimal parasternal window and Technically difficult study due to poor echo windows. IMPRESSIONS  1. Left ventricular ejection fraction, by estimation, is 55 to 60%. The left ventricle has normal function. The left ventricle demonstrates regional wall motion abnormalities (see scoring diagram/findings for description). Left ventricular diastolic parameters are consistent  with Grade I diastolic dysfunction (impaired relaxation).  2. Right ventricular systolic function is normal. The right ventricular size is normal.  3. The mitral valve is grossly normal. Trivial mitral valve regurgitation.  4. The aortic valve is grossly normal. Aortic valve regurgitation is trivial. FINDINGS  Left Ventricle: Left ventricular ejection fraction, by estimation, is 55 to 60%. The left ventricle has normal function. The left ventricle demonstrates regional wall motion abnormalities. The left ventricular internal cavity size was normal in size. There is no left ventricular hypertrophy. Left ventricular diastolic parameters are consistent with Grade I diastolic dysfunction (impaired relaxation).  LV Wall Scoring: The anterior wall and mid inferolateral segment are hypokinetic. Right Ventricle: The right ventricular size is normal. No increase in right ventricular wall thickness. Right ventricular systolic function is normal. Left Atrium: Left atrial size was normal in size. Right Atrium: Right atrial size was normal in size. Pericardium: The pericardium was not assessed. Mitral Valve: The mitral valve is grossly normal. Trivial mitral valve regurgitation. Tricuspid Valve: The tricuspid valve is normal in structure. Tricuspid valve regurgitation is not demonstrated. Aortic Valve: The aortic valve is grossly  normal. Aortic valve regurgitation is trivial. Aortic valve peak gradient measures 6.8 mmHg. Pulmonic Valve: The pulmonic valve was grossly normal. Pulmonic valve regurgitation is not visualized. Aorta: The aortic arch was not well visualized. IAS/Shunts: No atrial level shunt detected by color flow Doppler.  LEFT VENTRICLE PLAX 2D LVIDd:         3.89 cm  Diastology LVIDs:         2.50 cm  LV e' lateral:   3.81 cm/s LV PW:         1.42 cm  LV E/e' lateral: 17.8 LV IVS:        1.01 cm  LV e' medial:    5.11 cm/s LVOT diam:     1.80 cm  LV E/e' medial:  13.2 LV SV:         46 LV SV Index:   26 LVOT Area:     2.54 cm  RIGHT VENTRICLE RV Basal diam:  2.18 cm RV S prime:     14.60 cm/s TAPSE (M-mode): 1.9 cm LEFT ATRIUM             Index       RIGHT ATRIUM          Index LA diam:        3.90 cm 2.19 cm/m  RA Area:     7.74 cm LA Vol (A2C):   41.7 ml 23.47 ml/m RA Volume:   13.60 ml 7.65 ml/m LA Vol (A4C):   21.0 ml 11.82 ml/m LA Biplane Vol: 29.7 ml 16.71 ml/m  AORTIC VALVE AV Area (Vmax): 1.78 cm AV Vmax:        130.00 cm/s AV Peak Grad:   6.8 mmHg LVOT Vmax:      90.70 cm/s LVOT Vmean:     59.400 cm/s LVOT VTI:       0.181 m  AORTA Ao Root diam: 2.90 cm MITRAL VALVE               TRICUSPID VALVE MV Area (PHT): 5.38 cm    TV Peak grad:   16.2 mmHg MV Decel Time: 141 msec    TV Vmax:        2.01 m/s MV E velocity: 67.70 cm/s MV A velocity: 87.00 cm/s  SHUNTS MV E/A ratio:  0.78  Systemic VTI:  0.18 m                            Systemic Diam: 1.80 cm Yolonda Kida MD Electronically signed by Yolonda Kida MD Signature Date/Time: 04/03/2020/4:07:54 PM    Final       Labs: BNP (last 3 results) No results for input(s): BNP in the last 8760 hours. Basic Metabolic Panel: Recent Labs  Lab 04/01/20 1700 04/02/20 0443 04/03/20 0604  NA 138 138 143  K 3.7 3.8 3.9  CL 102 105 107  CO2 26 26 30   GLUCOSE 145* 197* 153*  BUN 14 12 13   CREATININE 0.79 0.75 0.85  CALCIUM 9.5 8.2* 8.4*  MG  --    --  2.1   Liver Function Tests: No results for input(s): AST, ALT, ALKPHOS, BILITOT, PROT, ALBUMIN in the last 168 hours. No results for input(s): LIPASE, AMYLASE in the last 168 hours. No results for input(s): AMMONIA in the last 168 hours. CBC: Recent Labs  Lab 04/01/20 1700 04/02/20 0443 04/03/20 0604  WBC 11.3* 9.2 5.2  HGB 13.5 11.5* 10.8*  HCT 40.3 34.5* 32.6*  MCV 92.2 92.2 93.7  PLT 244 238 189   Cardiac Enzymes: No results for input(s): CKTOTAL, CKMB, CKMBINDEX, TROPONINI in the last 168 hours. BNP: Invalid input(s): POCBNP CBG: Recent Labs  Lab 04/02/20 1714 04/02/20 2111 04/03/20 0751 04/03/20 1134 04/03/20 1711  GLUCAP 223* 148* 145* 209* 225*   D-Dimer No results for input(s): DDIMER in the last 72 hours. Hgb A1c Recent Labs    04/01/20 1742  HGBA1C 6.0*   Lipid Profile Recent Labs    04/02/20 0841  CHOL 182  HDL 29*  LDLCALC 96  TRIG 284*  CHOLHDL 6.3   Thyroid function studies No results for input(s): TSH, T4TOTAL, T3FREE, THYROIDAB in the last 72 hours.  Invalid input(s): FREET3 Anemia work up No results for input(s): VITAMINB12, FOLATE, FERRITIN, TIBC, IRON, RETICCTPCT in the last 72 hours. Urinalysis No results found for: COLORURINE, APPEARANCEUR, [place], [place], [place], [place], [place], [place], PROTEINUR, UROBILINOGEN, NITRITE, LEUKOCYTESUR Sepsis Labs Invalid input(s): PROCALCITONIN,  WBC,  LACTICIDVEN Microbiology Recent Results (from the past 240 hour(s))  SARS Coronavirus 2 by RT PCR (hospital order, performed in [facility] hospital lab) Nasopharyngeal Nasopharyngeal Swab     Status: None   Collection Time: 04/01/20  5:42 PM   Specimen: Nasopharyngeal Swab  Result Value Ref Range Status   SARS Coronavirus 2 NEGATIVE NEGATIVE Final    Comment: (NOTE) SARS-CoV-2 target nucleic acids are NOT DETECTED. The SARS-CoV-2 RNA is generally detectable in upper and lower respiratory specimens during the acute phase of  infection. The lowest concentration of SARS-CoV-2 viral copies this assay can detect is 250 copies / mL. A negative result does not preclude SARS-CoV-2 infection and should not be used as the sole basis for treatment or other patient management decisions.  A negative result may occur with improper specimen collection / handling, submission of specimen other than nasopharyngeal swab, presence of viral mutation(s) within the areas targeted by this assay, and inadequate number of viral copies (<250 copies / mL). A negative result must be combined with clinical observations, patient history, and epidemiological information. Fact Sheet for Patients:   StrictlyIdeas.no Fact Sheet for Healthcare Providers: BankingDealers.co.za This test is not yet approved or cleared  by the [place] FDA and has been authorized for detection and/or diagnosis of SARS-CoV-2 by FDA under  an Emergency Use Authorization (EUA).  This EUA will remain in effect (meaning this test can be used) for the duration of the COVID-19 declaration under Section 564(b)(1) of the Act, 21 U.S.C. section 360bbb-3(b)(1), unless the authorization is terminated or revoked sooner. Performed at [facility], [address] [place], [place] [address]   MRSA PCR Screening     Status: None   Collection Time: 04/01/20  9:58 PM   Specimen: Nasopharyngeal  Result Value Ref Range Status   MRSA by PCR NEGATIVE NEGATIVE Final    Comment:        The GeneXpert MRSA Assay (FDA approved for NASAL specimens only), is one component of a comprehensive MRSA colonization surveillance program. It is not intended to diagnose MRSA infection nor to guide or monitor treatment for MRSA infections. Performed at [facility], [address] [place], [place] [address]      Total time spend on discharging this patient, including the last patient exam, discussing the hospital  stay, instructions for ongoing care as it relates to all pertinent caregivers, as well as preparing the medical discharge records, prescriptions, and/or referrals as applicable, is 35 minutes.    Enzo Bi, MD  [facility] 04/03/2020, 5:12 PM  If 7PM-7AM, please contact night-coverage

## 2020-04-03 NOTE — TOC Progression Note (Addendum)
Transition of Care [facility]) - Progression Note    Patient Details  Name: Melissa Lamb MRN: [id number] Date of Birth: 1961-08-01  Transition of Care [facility]) CM/SW [place] Phone Number: [phone] 04/03/2020, 11:24 AM  Clinical Narrative:     TOC will contact patient's insurance company to make sure Brilinta medication is covered.       Expected Discharge Plan and Services                                                 Social Determinants of Health (SDOH) Interventions    Readmission Risk Interventions No flowsheet data found.

## 2020-04-03 NOTE — Progress Notes (Signed)
Patient notified RN that she could feel a little something in her chest area this morning, no pain or discomfort but it felt different at 0524am. Went to that exact time on Tele monitor recordings and pt did have a pvc. No pattern noted but did see PVCs once in a blue.  Reached out to [facility] and had them review previous recordings from start of shift until 0524 and they explain that she has had PVCs once in a blue. CCMD Tech also states that at that particular time 0524, the PVCs were more occasional than once in a blue but still no related pattern or consistency. Notified oncoming RN as well.  Will leave sticky note for doctor to address when making rounds.

## 2020-04-03 NOTE — Progress Notes (Signed)
*  PRELIMINARY RESULTS* Echocardiogram 2D Echocardiogram has been performed.  Melissa Lamb 04/03/2020, 10:16 AM

## 2020-04-04 ENCOUNTER — Encounter: Payer: Self-pay | Admitting: Cardiology

## 2020-04-04 LAB — THYROID PANEL WITH TSH
Free Thyroxine Index: 2.6 (ref 1.2–4.9)
T3 Uptake Ratio: 29 % (ref 24–39)
T4, Total: 9 ug/dL (ref 4.5–12.0)
TSH: 0.365 u[IU]/mL — ABNORMAL LOW (ref 0.450–4.500)

## 2020-04-06 ENCOUNTER — Encounter: Payer: Self-pay | Admitting: Family Medicine

## 2020-04-06 ENCOUNTER — Other Ambulatory Visit: Payer: Self-pay

## 2020-04-06 ENCOUNTER — Ambulatory Visit (INDEPENDENT_AMBULATORY_CARE_PROVIDER_SITE_OTHER): Payer: BC Managed Care – PPO | Admitting: Family Medicine

## 2020-04-06 VITALS — BP 148/92 | HR 94 | Temp 98.5°F | Resp 20 | Ht 67.5 in | Wt 143.0 lb

## 2020-04-06 DIAGNOSIS — E890 Postprocedural hypothyroidism: Secondary | ICD-10-CM

## 2020-04-06 DIAGNOSIS — E1169 Type 2 diabetes mellitus with other specified complication: Secondary | ICD-10-CM | POA: Diagnosis not present

## 2020-04-06 DIAGNOSIS — I252 Old myocardial infarction: Secondary | ICD-10-CM | POA: Diagnosis not present

## 2020-04-06 DIAGNOSIS — I10 Essential (primary) hypertension: Secondary | ICD-10-CM

## 2020-04-06 DIAGNOSIS — E785 Hyperlipidemia, unspecified: Secondary | ICD-10-CM

## 2020-04-06 MED ORDER — GLIPIZIDE 5 MG PO TABS: 7.5000 mg | ORAL_TABLET | Freq: Every day | ORAL | 5 refills | Status: DC

## 2020-04-06 MED ORDER — METFORMIN HCL 500 MG PO TABS: 500.0000 mg | ORAL_TABLET | Freq: Two times a day (BID) | ORAL | 5 refills | Status: DC

## 2020-04-06 NOTE — Assessment & Plan Note (Signed)
Excellent control on current medications and diet. Last Hgb A1c 6.0. If it remains well controlled could discuss going to one medication. Cont metformin and glipizide. Suspect some of her diarrhea may be adjusting to restarting metformin. Recommended watch and wait and if persisting would likely get stool studies.

## 2020-04-06 NOTE — Progress Notes (Signed)
Subjective:     Melissa Lamb is a 59 y.o. female presenting for Hospitalization Follow-up (pt is going to start cardiac rehab with the hospital)     HPI   #hx of STEMI - went to urgent care due to chest heaviness and SOB - did get the covid vaccine and noticed worsening SOB - was very active - starting checking blood sugar and bp which were abnormal - and things were not getting better - currently feeling tired - overall feeling ok  - has some anxiety about new symptoms now - seeing the cardiologist on Friday - incision is pretty good, but has has some bruising  #diarrhea - having some loose stools - endorses some fat in the stools - modified carb diet - metformin held in the hospital  #DM Lab Results  Component Value Date   HGBA1C 6.0 (H) 04/01/2020      # thyroid - neck discomfort - which is gone - was getting neck pain - endocrine appointment 04/14/2020   Review of Systems  5/14-5/16: Hospital Admission - STEMI w/p PCI and stent x2 - ASA and Brilinta x 12 month. HTN - BB and ACE, DM - hold metformin, HLD - Crestor  Social History   Tobacco Use  Smoking Status Never Smoker  Smokeless Tobacco Never Used        Objective:    BP Readings from Last 3 Encounters:  04/06/20 (!) 148/92  04/03/20 106/65  02/22/20 122/88   Wt Readings from Last 3 Encounters:  04/06/20 143 lb (64.9 kg)  04/03/20 143 lb 14.4 oz (65.3 kg)  02/22/20 146 lb (66.2 kg)    BP (!) 148/92   Pulse 94   Temp 98.5 F (36.9 C)   Resp 20   Ht 5' 7.5" (1.715 m)   Wt 143 lb (64.9 kg)   SpO2 99%   BMI 22.07 kg/m    Physical Exam Constitutional:      General: She is not in acute distress.    Appearance: She is well-developed. She is not diaphoretic.  HENT:     Right Ear: External ear normal.     Left Ear: External ear normal.  Eyes:     Conjunctiva/sclera: Conjunctivae normal.  Cardiovascular:     Rate and Rhythm: Normal rate and regular rhythm.     Heart sounds:  No murmur.  Pulmonary:     Effort: Pulmonary effort is normal. No respiratory distress.     Breath sounds: Normal breath sounds. No wheezing.  Musculoskeletal:     Cervical back: Neck supple.  Skin:    General: Skin is warm and dry.     Capillary Refill: Capillary refill takes less than 2 seconds.     Comments: Scattered bruising and soft tissue swelling near the incision site. No erythema. No abscess.   Neurological:     Mental Status: She is alert. Mental status is at baseline.  Psychiatric:        Mood and Affect: Mood normal.        Behavior: Behavior normal.           Assessment & Plan:   Problem List Items Addressed This Visit      Cardiovascular and Mediastinum   Essential hypertension - Primary    Reports good control at home. Will continue metoprolol and continue to monitor.         Endocrine   Type 2 diabetes mellitus with hyperlipidemia (HCC)    Excellent control on current medications  and diet. Last Hgb A1c 6.0. If it remains well controlled could discuss going to one medication. Cont metformin and glipizide. Suspect some of her diarrhea may be adjusting to restarting metformin. Recommended watch and wait and if persisting would likely get stool studies.       Relevant Medications   glipiZIDE (GLUCOTROL) 5 MG tablet   metFORMIN (GLUCOPHAGE) 500 MG tablet   Post-surgical hypothyroidism    Was previously well controled, but labs in the hospital with reduced TSH. She has an endocrinology appt next week - will defer to them. Appreciate endo support.         Other   History of ST elevation myocardial infarction (STEMI)    Doing well post STEMI and has cardiology f/u in 2 days and planning to start cardiac rehab. No cp or SOB since leaving the hospital. Reports normal BP at home, though slightly elevated in the office. Reviewed d/c summary and last IP Cardiology note. She was able to afford Brilinta and has been tolerating well.           Return in about 6  months (around 10/07/2020).  [name], MD

## 2020-04-06 NOTE — Assessment & Plan Note (Signed)
Doing well post STEMI and has cardiology f/u in 2 days and planning to start cardiac rehab. No cp or SOB since leaving the hospital. Reports normal BP at home, though slightly elevated in the office. Reviewed d/c summary and last IP Cardiology note. She was able to afford Brilinta and has been tolerating well.

## 2020-04-06 NOTE — Assessment & Plan Note (Signed)
Reports good control at home. Will continue metoprolol and continue to monitor.

## 2020-04-06 NOTE — Patient Instructions (Addendum)
#  Diarrhea - Would continue to monitor - OK to try Imodium - If not improving or you develop worsening symptoms - stomach cramping, >5 BM daily - return for stool testing  See Cardiology and Endocrinology

## 2020-04-06 NOTE — Assessment & Plan Note (Signed)
Was previously well controled, but labs in the hospital with reduced TSH. She has an endocrinology appt next week - will defer to them. Appreciate endo support.

## 2020-04-07 ENCOUNTER — Other Ambulatory Visit: Payer: Self-pay

## 2020-04-07 ENCOUNTER — Encounter: Payer: BC Managed Care – PPO | Attending: Internal Medicine

## 2020-04-07 DIAGNOSIS — E785 Hyperlipidemia, unspecified: Secondary | ICD-10-CM | POA: Insufficient documentation

## 2020-04-07 DIAGNOSIS — E119 Type 2 diabetes mellitus without complications: Secondary | ICD-10-CM | POA: Insufficient documentation

## 2020-04-07 DIAGNOSIS — Z85850 Personal history of malignant neoplasm of thyroid: Secondary | ICD-10-CM | POA: Insufficient documentation

## 2020-04-07 DIAGNOSIS — I213 ST elevation (STEMI) myocardial infarction of unspecified site: Secondary | ICD-10-CM

## 2020-04-07 DIAGNOSIS — Z79890 Hormone replacement therapy (postmenopausal): Secondary | ICD-10-CM | POA: Insufficient documentation

## 2020-04-07 DIAGNOSIS — Z7984 Long term (current) use of oral hypoglycemic drugs: Secondary | ICD-10-CM | POA: Insufficient documentation

## 2020-04-07 DIAGNOSIS — Z7902 Long term (current) use of antithrombotics/antiplatelets: Secondary | ICD-10-CM | POA: Insufficient documentation

## 2020-04-07 DIAGNOSIS — Z79899 Other long term (current) drug therapy: Secondary | ICD-10-CM | POA: Insufficient documentation

## 2020-04-07 DIAGNOSIS — Z7982 Long term (current) use of aspirin: Secondary | ICD-10-CM | POA: Insufficient documentation

## 2020-04-07 DIAGNOSIS — I252 Old myocardial infarction: Secondary | ICD-10-CM | POA: Insufficient documentation

## 2020-04-07 DIAGNOSIS — I10 Essential (primary) hypertension: Secondary | ICD-10-CM | POA: Insufficient documentation

## 2020-04-07 NOTE — Progress Notes (Signed)
Virtual Visit completed. Patient informed on EP and RD appointment and 6 Minute walk test. Patient also informed of patient health questionnaires on My Chart. Patient Verbalizes understanding. Visit diagnosis can be found in [facility] 04/01/2020.

## 2020-04-08 ENCOUNTER — Encounter: Payer: Self-pay | Admitting: Family Medicine

## 2020-04-08 DIAGNOSIS — I10 Essential (primary) hypertension: Secondary | ICD-10-CM | POA: Diagnosis not present

## 2020-04-08 DIAGNOSIS — I2510 Atherosclerotic heart disease of native coronary artery without angina pectoris: Secondary | ICD-10-CM | POA: Diagnosis not present

## 2020-04-08 DIAGNOSIS — Z7689 Persons encountering health services in other specified circumstances: Secondary | ICD-10-CM | POA: Diagnosis not present

## 2020-04-08 DIAGNOSIS — I2121 ST elevation (STEMI) myocardial infarction involving left circumflex coronary artery: Secondary | ICD-10-CM | POA: Diagnosis not present

## 2020-04-08 DIAGNOSIS — E7849 Other hyperlipidemia: Secondary | ICD-10-CM

## 2020-04-11 ENCOUNTER — Other Ambulatory Visit: Payer: Self-pay

## 2020-04-11 ENCOUNTER — Encounter: Payer: BC Managed Care – PPO | Admitting: *Deleted

## 2020-04-11 VITALS — Ht 68.0 in | Wt 143.9 lb

## 2020-04-11 DIAGNOSIS — Z79890 Hormone replacement therapy (postmenopausal): Secondary | ICD-10-CM | POA: Diagnosis not present

## 2020-04-11 DIAGNOSIS — I10 Essential (primary) hypertension: Secondary | ICD-10-CM | POA: Diagnosis not present

## 2020-04-11 DIAGNOSIS — Z7902 Long term (current) use of antithrombotics/antiplatelets: Secondary | ICD-10-CM | POA: Diagnosis not present

## 2020-04-11 DIAGNOSIS — Z7984 Long term (current) use of oral hypoglycemic drugs: Secondary | ICD-10-CM | POA: Diagnosis not present

## 2020-04-11 DIAGNOSIS — Z85850 Personal history of malignant neoplasm of thyroid: Secondary | ICD-10-CM | POA: Diagnosis not present

## 2020-04-11 DIAGNOSIS — Z79899 Other long term (current) drug therapy: Secondary | ICD-10-CM | POA: Diagnosis not present

## 2020-04-11 DIAGNOSIS — Z7982 Long term (current) use of aspirin: Secondary | ICD-10-CM | POA: Diagnosis not present

## 2020-04-11 DIAGNOSIS — E785 Hyperlipidemia, unspecified: Secondary | ICD-10-CM | POA: Diagnosis not present

## 2020-04-11 DIAGNOSIS — E119 Type 2 diabetes mellitus without complications: Secondary | ICD-10-CM | POA: Diagnosis not present

## 2020-04-11 DIAGNOSIS — I213 ST elevation (STEMI) myocardial infarction of unspecified site: Secondary | ICD-10-CM

## 2020-04-11 DIAGNOSIS — I252 Old myocardial infarction: Secondary | ICD-10-CM | POA: Diagnosis not present

## 2020-04-11 NOTE — Patient Instructions (Signed)
Patient Instructions  Patient Details  Name: Melissa Lamb MRN: [id number] Date of Birth: 01/17/1961 Referring Provider:  Yolonda Kida, MD  Below are your personal goals for exercise, nutrition, and risk factors. Our goal is to help you stay on track towards obtaining and maintaining these goals. We will be discussing your progress on these goals with you throughout the program.  Initial Exercise Prescription: Initial Exercise Prescription - 04/11/20 1400      Date of Initial Exercise RX and Referring Provider   Date  04/11/20    Referring Provider  [facility]      Treadmill   MPH  2.5    Grade  3    Minutes  15    METs  4      Recumbant Bike   Level  3    RPM  60    Watts  41    Minutes  15    METs  4      NuStep   Level  3    SPM  80    Minutes  15    METs  4      REL-XR   Level  4    Speed  50    Minutes  15    METs  4      T5 Nustep   Level  2    SPM  80    Minutes  15    METs  4      Prescription Details   Frequency (times per week)  3      Intensity   THRR 40-80% of Max Heartrate  111-144    Ratings of Perceived Exertion  11-15    Perceived Dyspnea  0-4      Resistance Training   Training Prescription  Yes    Weight  3 lb    Reps  10-15       Exercise Goals: Frequency: Be able to perform aerobic exercise two to three times per week in program working toward 2-5 days per week of home exercise.  Intensity: Work with a perceived exertion of 11 (fairly light) - 15 (hard) while following your exercise prescription.  We will make changes to your prescription with you as you progress through the program.   Duration: Be able to do 30 to 45 minutes of continuous aerobic exercise in addition to a 5 minute warm-up and a 5 minute cool-down routine.   Nutrition Goals: Your personal nutrition goals will be established when you do your nutrition analysis with the dietician.  The following are general nutrition guidelines to follow: Cholesterol <  200mg /day Sodium < 1500mg /day Fiber: Women over 50 yrs - 21 grams per day    Personal Goals: Personal Goals and Risk Factors at Admission - 04/11/20 1452      Core Components/Risk Factors/Patient Goals on Admission    Weight Management  Yes    Intervention  Weight Management: Develop a combined nutrition and exercise program designed to reach desired caloric intake, while maintaining appropriate intake of nutrient and fiber, sodium and fats, and appropriate energy expenditure required for the weight goal.;Weight Management: Provide education and appropriate resources to help participant work on and attain dietary goals.;Weight Management/Obesity: Establish reasonable short term and long term weight goals.    Admit Weight  143 lb 14.4 oz (65.3 kg)    Goal Weight: Short Term  143 lb (64.9 kg)    Goal Weight: Long Term  143 lb (64.9 kg)  Expected Outcomes  Weight Maintenance: Understanding of the daily nutrition guidelines, which includes 25-35% calories from fat, 7% or less cal from saturated fats, less than 200mg  cholesterol, less than 1.5gm of sodium, & 5 or more servings of fruits and vegetables daily;Long Term: Adherence to nutrition and physical activity/exercise program aimed toward attainment of established weight goal;Short Term: Continue to assess and modify interventions until short term weight is achieved;Understanding recommendations for meals to include 15-35% energy as protein, 25-35% energy from fat, 35-60% energy from carbohydrates, less than 200mg  of dietary cholesterol, 20-35 gm of total fiber daily;Understanding of distribution of calorie intake throughout the day with the consumption of 4-5 meals/snacks    Diabetes  Yes    Intervention  Provide education about signs/symptoms and action to take for hypo/hyperglycemia.;Provide education about proper nutrition, including hydration, and aerobic/resistive exercise prescription along with prescribed medications to achieve blood glucose  in normal ranges: Fasting glucose 65-99 mg/dL    Expected Outcomes  Short Term: Participant verbalizes understanding of the signs/symptoms and immediate care of hyper/hypoglycemia, proper foot care and importance of medication, aerobic/resistive exercise and nutrition plan for blood glucose control.;Long Term: Attainment of HbA1C < 7%.    Intervention  Provide education on lifestyle modifcations including regular physical activity/exercise, weight management, moderate sodium restriction and increased consumption of fresh fruit, vegetables, and low fat dairy, alcohol moderation, and smoking cessation.;Monitor prescription use compliance.    Expected Outcomes  Short Term: Continued assessment and intervention until BP is < 140/75mm HG in hypertensive participants. < 130/57mm HG in hypertensive participants with diabetes, heart failure or chronic kidney disease.;Long Term: Maintenance of blood pressure at goal levels.    Intervention  Provide education and support for participant on nutrition & aerobic/resistive exercise along with prescribed medications to achieve LDL 70mg , HDL >40mg .    Expected Outcomes  Short Term: Participant states understanding of desired cholesterol values and is compliant with medications prescribed. Participant is following exercise prescription and nutrition guidelines.;Long Term: Cholesterol controlled with medications as prescribed, with individualized exercise RX and with personalized nutrition plan. Value goals: LDL < 70mg , HDL > 40 mg.       Tobacco Use Initial Evaluation: Social History   Tobacco Use  Smoking Status Never Smoker  Smokeless Tobacco Never Used    Exercise Goals and Review: Exercise Goals    Row Name 04/11/20 1442             Exercise Goals   Increase Physical Activity  Yes       Intervention  Provide advice, education, support and counseling about physical activity/exercise needs.;Develop an individualized exercise prescription for aerobic and  resistive training based on initial evaluation findings, risk stratification, comorbidities and participant's personal goals.       Expected Outcomes  Short Term: Attend rehab on a regular basis to increase amount of physical activity.;Long Term: Add in home exercise to make exercise part of routine and to increase amount of physical activity.;Long Term: Exercising regularly at least 3-5 days a week.       Increase Strength and Stamina  Yes       Intervention  Provide advice, education, support and counseling about physical activity/exercise needs.;Develop an individualized exercise prescription for aerobic and resistive training based on initial evaluation findings, risk stratification, comorbidities and participant's personal goals.       Expected Outcomes  Short Term: Increase workloads from initial exercise prescription for resistance, speed, and METs.;Short Term: Perform resistance training exercises routinely during rehab and add in  resistance training at home;Long Term: Improve cardiorespiratory fitness, muscular endurance and strength as measured by increased METs and functional capacity (6MWT)       Able to understand and use rate of perceived exertion (RPE) scale  Yes       Intervention  Provide education and explanation on how to use RPE scale       Expected Outcomes  Short Term: Able to use RPE daily in rehab to express subjective intensity level;Long Term:  Able to use RPE to guide intensity level when exercising independently       Knowledge and understanding of Target Heart Rate Range (THRR)  Yes       Intervention  Provide education and explanation of THRR including how the numbers were predicted and where they are located for reference       Expected Outcomes  Short Term: Able to state/look up THRR;Short Term: Able to use daily as guideline for intensity in rehab;Long Term: Able to use THRR to govern intensity when exercising independently       Able to check pulse independently  Yes        Intervention  Provide education and demonstration on how to check pulse in carotid and radial arteries.;Review the importance of being able to check your own pulse for safety during independent exercise       Expected Outcomes  Short Term: Able to explain why pulse checking is important during independent exercise;Long Term: Able to check pulse independently and accurately       Understanding of Exercise Prescription  Yes       Intervention  Provide education, explanation, and written materials on patient's individual exercise prescription       Expected Outcomes  Short Term: Able to explain program exercise prescription;Long Term: Able to explain home exercise prescription to exercise independently          Copy of goals given to participant.

## 2020-04-11 NOTE — Progress Notes (Signed)
Cardiac Individual Treatment Plan  Patient Details  Name: Melissa Lamb MRN: [id number] Date of Birth: 02-05-1961 Referring Provider:     Cardiac Rehab from 04/11/2020 in [facility] Cardiac and Pulmonary Rehab  Referring Provider  [facility]      Initial Encounter Date:    Cardiac Rehab from 04/11/2020 in [facility] Cardiac and Pulmonary Rehab  Date  04/11/20      Visit Diagnosis: ST elevation myocardial infarction (STEMI), unspecified artery ([place])  Patient's Home Medications on Admission:  Current Outpatient Medications:  .  aspirin 81 MG chewable tablet, Chew 1 tablet (81 mg total) by mouth daily. Need to take for at least a year., Disp: , Rfl:  .  Cholecalciferol (VITAMIN D-1000 MAX ST) 25 MCG (1000 UT) tablet, Take 2,000 Units by mouth daily. , Disp: , Rfl:  .  fenofibrate 160 MG tablet, Take 1 tablet (160 mg total) by mouth daily., Disp: 90 tablet, Rfl: 2 .  glipiZIDE (GLUCOTROL) 5 MG tablet, Take 1.5 tablets (7.5 mg total) by mouth daily., Disp: 45 tablet, Rfl: 5 .  levothyroxine (SYNTHROID) 88 MCG tablet, Take 88 mcg by mouth daily. , Disp: , Rfl:  .  metFORMIN (GLUCOPHAGE) 500 MG tablet, Take 1 tablet (500 mg total) by mouth 2 (two) times daily., Disp: 60 tablet, Rfl: 5 .  metoprolol succinate (TOPROL-XL) 25 MG 24 hr tablet, Take 1 tablet (25 mg total) by mouth daily., Disp: 90 tablet, Rfl: 2 .  rosuvastatin (CRESTOR) 40 MG tablet, Take 1 tablet (40 mg total) by mouth daily., Disp: 30 tablet, Rfl: 2 .  ticagrelor (BRILINTA) 90 MG TABS tablet, Take 1 tablet (90 mg total) by mouth 2 (two) times daily. Need to take this for at least a year., Disp: 60 tablet, Rfl: 11  Past Medical History: Past Medical History:  Diagnosis Date  . Diabetes mellitus without complication ([place])   . History of chickenpox   . Hyperlipidemia   . Hypertension   . Thyroid cancer ([place])     Tobacco Use: Social History   Tobacco Use  Smoking Status Never Smoker  Smokeless Tobacco Never Used     Labs: Recent Review Flowsheet Data    Labs for ITP Cardiac and Pulmonary Rehab Latest Ref Rng & Units 07/10/2018 10/20/2019 02/22/2020 04/01/2020 04/02/2020   Cholestrol 0 - 200 mg/dL 250(A) - - - 182   LDLCALC 0 - 99 mg/dL 144 - - - 96   HDL >40 mg/dL 31(A) - - - 29(L)   Trlycerides <150 mg/dL 375(A) - - - 284(H)   Hemoglobin A1c 4.8 - 5.6 % 6.0 5.8(A) 6.1(A) 6.0(H) -       Exercise Target Goals: Exercise Program Goal: Individual exercise prescription set using results from initial 6 min walk test and THRR while considering  patient's activity barriers and safety.   Exercise Prescription Goal: Initial exercise prescription builds to 30-45 minutes a day of aerobic activity, 2-3 days per week.  Home exercise guidelines will be given to patient during program as part of exercise prescription that the participant will acknowledge.   Education: Aerobic Exercise & Resistance Training: - Gives group verbal and written instruction on the various components of exercise. Focuses on aerobic and resistive training programs and the benefits of this training and how to safely progress through these programs..   Education: Exercise & Equipment Safety: - Individual verbal instruction and demonstration of equipment use and safety with use of the equipment.   Cardiac Rehab from 04/11/2020 in [facility] Cardiac and Pulmonary Rehab  Date  04/11/20  Educator  AS  Instruction Review Code  1- Verbalizes Understanding      Education: Exercise Physiology & General Exercise Guidelines: - Group verbal and written instruction with models to review the exercise physiology of the cardiovascular system and associated critical values. Provides general exercise guidelines with specific guidelines to those with heart or lung disease.    Education: Flexibility, Balance, Mind/Body Relaxation: Provides group verbal/written instruction on the benefits of flexibility and balance training, including mind/body exercise modes  such as yoga, pilates and tai chi.  Demonstration and skill practice provided.   Activity Barriers & Risk Stratification:   6 Minute Walk: 6 Minute Walk    Row Name 04/11/20 1436         6 Minute Walk   Phase  Initial     Distance  1300 feet     Walk Time  6 minutes     # of Rest Breaks  0     MPH  2.46     METS  3.99     RPE  11     Perceived Dyspnea   1     VO2 Peak  13.96     Symptoms  No     Resting HR  78 bpm     Resting BP  118/64     Resting Oxygen Saturation   98 %     Exercise Oxygen Saturation  during 6 min walk  100 %     Max Ex. HR  115 bpm     Max Ex. BP  130/62     2 Minute Post BP  122/64        Oxygen Initial Assessment:   Oxygen Re-Evaluation:   Oxygen Discharge (Final Oxygen Re-Evaluation):   Initial Exercise Prescription: Initial Exercise Prescription - 04/11/20 1400      Date of Initial Exercise RX and Referring Provider   Date  04/11/20    Referring Provider  [facility]      Treadmill   MPH  2.5    Grade  3    Minutes  15    METs  4      Recumbant Bike   Level  3    RPM  60    Watts  41    Minutes  15    METs  4      NuStep   Level  3    SPM  80    Minutes  15    METs  4      REL-XR   Level  4    Speed  50    Minutes  15    METs  4      T5 Nustep   Level  2    SPM  80    Minutes  15    METs  4      Prescription Details   Frequency (times per week)  3      Intensity   THRR 40-80% of Max Heartrate  111-144    Ratings of Perceived Exertion  11-15    Perceived Dyspnea  0-4      Resistance Training   Training Prescription  Yes    Weight  3 lb    Reps  10-15       Perform Capillary Blood Glucose checks as needed.  Exercise Prescription Changes: Exercise Prescription Changes    Row Name 04/11/20 1400  Response to Exercise   Blood Pressure (Admit)  118/64       Blood Pressure (Exercise)  130/62       Blood Pressure (Exit)  122/64       Heart Rate (Admit)  78 bpm       Heart Rate  (Exercise)  115 bpm       Heart Rate (Exit)  70 bpm       Oxygen Saturation (Admit)  98 %       Oxygen Saturation (Exercise)  100 %       Rating of Perceived Exertion (Exercise)  11       Perceived Dyspnea (Exercise)  1       Symptoms  none          Exercise Comments:   Exercise Goals and Review: Exercise Goals    Row Name 04/11/20 1442             Exercise Goals   Increase Physical Activity  Yes       Intervention  Provide advice, education, support and counseling about physical activity/exercise needs.;Develop an individualized exercise prescription for aerobic and resistive training based on initial evaluation findings, risk stratification, comorbidities and participant's personal goals.       Expected Outcomes  Short Term: Attend rehab on a regular basis to increase amount of physical activity.;Long Term: Add in home exercise to make exercise part of routine and to increase amount of physical activity.;Long Term: Exercising regularly at least 3-5 days a week.       Increase Strength and Stamina  Yes       Intervention  Provide advice, education, support and counseling about physical activity/exercise needs.;Develop an individualized exercise prescription for aerobic and resistive training based on initial evaluation findings, risk stratification, comorbidities and participant's personal goals.       Expected Outcomes  Short Term: Increase workloads from initial exercise prescription for resistance, speed, and METs.;Short Term: Perform resistance training exercises routinely during rehab and add in resistance training at home;Long Term: Improve cardiorespiratory fitness, muscular endurance and strength as measured by increased METs and functional capacity (6MWT)       Able to understand and use rate of perceived exertion (RPE) scale  Yes       Intervention  Provide education and explanation on how to use RPE scale       Expected Outcomes  Short Term: Able to use RPE daily in rehab to  express subjective intensity level;Long Term:  Able to use RPE to guide intensity level when exercising independently       Knowledge and understanding of Target Heart Rate Range (THRR)  Yes       Intervention  Provide education and explanation of THRR including how the numbers were predicted and where they are located for reference       Expected Outcomes  Short Term: Able to state/look up THRR;Short Term: Able to use daily as guideline for intensity in rehab;Long Term: Able to use THRR to govern intensity when exercising independently       Able to check pulse independently  Yes       Intervention  Provide education and demonstration on how to check pulse in carotid and radial arteries.;Review the importance of being able to check your own pulse for safety during independent exercise       Expected Outcomes  Short Term: Able to explain why pulse checking is important during independent exercise;Long Term: Able to check pulse  independently and accurately       Understanding of Exercise Prescription  Yes       Intervention  Provide education, explanation, and written materials on patient's individual exercise prescription       Expected Outcomes  Short Term: Able to explain program exercise prescription;Long Term: Able to explain home exercise prescription to exercise independently          Exercise Goals Re-Evaluation :   Discharge Exercise Prescription (Final Exercise Prescription Changes): Exercise Prescription Changes - 04/11/20 1400      Response to Exercise   Blood Pressure (Admit)  118/64    Blood Pressure (Exercise)  130/62    Blood Pressure (Exit)  122/64    Heart Rate (Admit)  78 bpm    Heart Rate (Exercise)  115 bpm    Heart Rate (Exit)  70 bpm    Oxygen Saturation (Admit)  98 %    Oxygen Saturation (Exercise)  100 %    Rating of Perceived Exertion (Exercise)  11    Perceived Dyspnea (Exercise)  1    Symptoms  none       Nutrition:  Target Goals: Understanding of  nutrition guidelines, daily intake of sodium <1558m, cholesterol <2057m calories 30% from fat and 7% or less from saturated fats, daily to have 5 or more servings of fruits and vegetables.  Education: Controlling Sodium/Reading Food Labels -Group verbal and written material supporting the discussion of sodium use in heart healthy nutrition. Review and explanation with models, verbal and written materials for utilization of the food label.   Education: General Nutrition Guidelines/Fats and Fiber: -Group instruction provided by verbal, written material, models and posters to present the general guidelines for heart healthy nutrition. Gives an explanation and review of dietary fats and fiber.   Biometrics: Pre Biometrics - 04/11/20 1448      Pre Biometrics   Height  _0  (1.727 m)    Weight  143 lb 14.4 oz (65.3 kg)    BMI (Calculated)  21.89    Single Leg Stand  30 seconds        Nutrition Therapy Plan and Nutrition Goals:   Nutrition Assessments: Nutrition Assessments - 04/11/20 1451      MEDFICTS Scores   Pre Score  18       MEDIFICTS Score Key:          ?70 Need to make dietary changes          40-70 Heart Healthy Diet         ? 40 Therapeutic Level Cholesterol Diet  Nutrition Goals Re-Evaluation:   Nutrition Goals Discharge (Final Nutrition Goals Re-Evaluation):   Psychosocial: Target Goals: Acknowledge presence or absence of significant depression and/or stress, maximize coping skills, provide positive support system. Participant is able to verbalize types and ability to use techniques and skills needed for reducing stress and depression.   Education: Depression - Provides group verbal and written instruction on the correlation between heart/lung disease and depressed mood, treatment options, and the stigmas associated with seeking treatment.   Education: Sleep Hygiene -Provides group verbal and written instruction about how sleep can affect your health.   Define sleep hygiene, discuss sleep cycles and impact of sleep habits. Review good sleep hygiene tips.     Education: Stress and Anxiety: - Provides group verbal and written instruction about the health risks of elevated stress and causes of high stress.  Discuss the correlation between heart/lung disease and anxiety and treatment options. Review  healthy ways to manage with stress and anxiety.    Initial Review & Psychosocial Screening: Initial Psych Review & Screening - 04/07/20 1331      Initial Review   Current issues with  None Identified      Family Dynamics   Good Support System?  Yes    Comments  She can look to her children, daughter, mom and dad for support. Overall she is a positive person but she her husband had passed away last year.      Barriers   Psychosocial barriers to participate in program  There are no identifiable barriers or psychosocial needs.;The patient should benefit from training in stress management and relaxation.      Screening Interventions   Interventions  Encouraged to exercise;Program counselor consult;Provide feedback about the scores to participant;To provide support and resources with identified psychosocial needs    Expected Outcomes  Short Term goal: Utilizing psychosocial counselor, staff and physician to assist with identification of specific Stressors or current issues interfering with healing process. Setting desired goal for each stressor or current issue identified.;Long Term Goal: Stressors or current issues are controlled or eliminated.;Short Term goal: Identification and review with participant of any Quality of Life or Depression concerns found by scoring the questionnaire.;Long Term goal: The participant improves quality of Life and PHQ9 Scores as seen by post scores and/or verbalization of changes       Quality of Life Scores:  Quality of Life - 04/11/20 1449      Quality of Life   Select  Quality of Life      Quality of Life Scores    Health/Function Pre  22.39 %    Socioeconomic Pre  22.5 %    Psych/Spiritual Pre  21.71 %    Family Pre  25.5 %      Scores of 19 and below usually indicate a poorer quality of life in these areas.  A difference of  2-3 points is a clinically meaningful difference.  A difference of 2-3 points in the total score of the Quality of Life Index has been associated with significant improvement in overall quality of life, self-image, physical symptoms, and general health in studies assessing change in quality of life.  PHQ-9: Recent Review Flowsheet Data    Depression screen [facility] 2/9 04/11/2020 10/20/2019   Decreased Interest 0 0   Down, Depressed, Hopeless 0 0   PHQ - 2 Score 0 0   Altered sleeping 1 -   Tired, decreased energy 1 -   Change in appetite 0 -   Feeling bad or failure about yourself  0 -   Trouble concentrating 0 -   Moving slowly or fidgety/restless 0 -   Suicidal thoughts 0 -   PHQ-9 Score 2 -   Difficult doing work/chores Not difficult at all -     Interpretation of Total Score  Total Score Depression Severity:  1-4 = Minimal depression, 5-9 = Mild depression, 10-14 = Moderate depression, 15-19 = Moderately severe depression, 20-27 = Severe depression   Psychosocial Evaluation and Intervention: Psychosocial Evaluation - 04/07/20 1333      Psychosocial Evaluation & Interventions   Interventions  Encouraged to exercise with the program and follow exercise prescription    Comments  She can look to her children, daughter, mom and dad for support. Overall she is a positive person but she her husband had passed away last year.    Expected Outcomes  Short: start Cardiac rehab to improve mood.  Long: keep stress at a minimum.    Continue Psychosocial Services   Follow up required by staff       Psychosocial Re-Evaluation:   Psychosocial Discharge (Final Psychosocial Re-Evaluation):   Vocational Rehabilitation: Provide vocational rehab assistance to qualifying  candidates.   Vocational Rehab Evaluation & Intervention:   Education: Education Goals: Education classes will be provided on a variety of topics geared toward better understanding of heart health and risk factor modification. Participant will state understanding/return demonstration of topics presented as noted by education test scores.  Learning Barriers/Preferences: Learning Barriers/Preferences - 04/07/20 1334      Learning Barriers/Preferences   Learning Barriers  None    Learning Preferences  None       General Cardiac Education Topics:  AED/CPR: - Group verbal and written instruction with the use of models to demonstrate the basic use of the AED with the basic ABC's of resuscitation.   Anatomy & Physiology of the Heart: - Group verbal and written instruction and models provide basic cardiac anatomy and physiology, with the coronary electrical and arterial systems. Review of Valvular disease and Heart Failure   Cardiac Procedures: - Group verbal and written instruction to review commonly prescribed medications for heart disease. Reviews the medication, class of the drug, and side effects. Includes the steps to properly store meds and maintain the prescription regimen. (beta blockers and nitrates)   Cardiac Medications I: - Group verbal and written instruction to review commonly prescribed medications for heart disease. Reviews the medication, class of the drug, and side effects. Includes the steps to properly store meds and maintain the prescription regimen.   Cardiac Medications II: -Group verbal and written instruction to review commonly prescribed medications for heart disease. Reviews the medication, class of the drug, and side effects. (all other drug classes)    Go Sex-Intimacy & Heart Disease, Get SMART - Goal Setting: - Group verbal and written instruction through game format to discuss heart disease and the return to sexual intimacy. Provides group verbal and  written material to discuss and apply goal setting through the application of the S.M.A.R.T. Method.   Other Matters of the Heart: - Provides group verbal, written materials and models to describe Stable Angina and Peripheral Artery. Includes description of the disease process and treatment options available to the cardiac patient.   Infection Prevention: - Provides verbal and written material to individual with discussion of infection control including proper hand washing and proper equipment cleaning during exercise session.   Cardiac Rehab from 04/11/2020 in [facility] Cardiac and Pulmonary Rehab  Date  04/11/20  Educator  AS  Instruction Review Code  1- Verbalizes Understanding      Falls Prevention: - Provides verbal and written material to individual with discussion of falls prevention and safety.   Cardiac Rehab from 04/11/2020 in [facility] Cardiac and Pulmonary Rehab  Date  04/11/20  Educator  AS  Instruction Review Code  1- Verbalizes Understanding      Other: -Provides group and verbal instruction on various topics (see comments)   Knowledge Questionnaire Score: Knowledge Questionnaire Score - 04/11/20 1449      Knowledge Questionnaire Score   Pre Score  25/26       Core Components/Risk Factors/Patient Goals at Admission: Personal Goals and Risk Factors at Admission - 04/11/20 1452      Core Components/Risk Factors/Patient Goals on Admission    Weight Management  Yes    Intervention  Weight Management: Develop a combined nutrition and exercise program  designed to reach desired caloric intake, while maintaining appropriate intake of nutrient and fiber, sodium and fats, and appropriate energy expenditure required for the weight goal.;Weight Management: Provide education and appropriate resources to help participant work on and attain dietary goals.;Weight Management/Obesity: Establish reasonable short term and long term weight goals.    Admit Weight  143 lb 14.4 oz (65.3 kg)     Goal Weight: Short Term  143 lb (64.9 kg)    Goal Weight: Long Term  143 lb (64.9 kg)    Expected Outcomes  Weight Maintenance: Understanding of the daily nutrition guidelines, which includes 25-35% calories from fat, 7% or less cal from saturated fats, less than 249m cholesterol, less than 1.5gm of sodium, & 5 or more servings of fruits and vegetables daily;Long Term: Adherence to nutrition and physical activity/exercise program aimed toward attainment of established weight goal;Short Term: Continue to assess and modify interventions until short term weight is achieved;Understanding recommendations for meals to include 15-35% energy as protein, 25-35% energy from fat, 35-60% energy from carbohydrates, less than 2045mof dietary cholesterol, 20-35 gm of total fiber daily;Understanding of distribution of calorie intake throughout the day with the consumption of 4-5 meals/snacks    Diabetes  Yes    Intervention  Provide education about signs/symptoms and action to take for hypo/hyperglycemia.;Provide education about proper nutrition, including hydration, and aerobic/resistive exercise prescription along with prescribed medications to achieve blood glucose in normal ranges: Fasting glucose 65-99 mg/dL    Expected Outcomes  Short Term: Participant verbalizes understanding of the signs/symptoms and immediate care of hyper/hypoglycemia, proper foot care and importance of medication, aerobic/resistive exercise and nutrition plan for blood glucose control.;Long Term: Attainment of HbA1C < 7%.    Intervention  Provide education on lifestyle modifcations including regular physical activity/exercise, weight management, moderate sodium restriction and increased consumption of fresh fruit, vegetables, and low fat dairy, alcohol moderation, and smoking cessation.;Monitor prescription use compliance.    Expected Outcomes  Short Term: Continued assessment and intervention until BP is < 140/9059mG in hypertensive  participants. < 130/66m23m in hypertensive participants with diabetes, heart failure or chronic kidney disease.;Long Term: Maintenance of blood pressure at goal levels.    Intervention  Provide education and support for participant on nutrition & aerobic/resistive exercise along with prescribed medications to achieve LDL <70mg47mL >40mg.75mExpected Outcomes  Short Term: Participant states understanding of desired cholesterol values and is compliant with medications prescribed. Participant is following exercise prescription and nutrition guidelines.;Long Term: Cholesterol controlled with medications as prescribed, with individualized exercise RX and with personalized nutrition plan. Value goals: LDL < 70mg, 49m> 40 mg.       Education:Diabetes - Individual verbal and written instruction to review signs/symptoms of diabetes, desired ranges of glucose level fasting, after meals and with exercise. Acknowledge that pre and post exercise glucose checks will be done for 3 sessions at entry of program.   [facility]  Date  04/07/20  Educator  [facility] Code  1- Verbalizes Understanding      Education: Know Your Numbers and Risk Factors: -Group verbal and written instruction about important numbers in your health.  Discussion of what are risk factors and how they play a role in the disease process.  Review of Cholesterol, Blood Pressure, Diabetes, and BMI and the role they play in your overall health.   Core Components/Risk Factors/Patient Goals Review:    Core Components/Risk Factors/Patient  Goals at Discharge (Final Review):    ITP Comments: ITP Comments    Row Name 04/07/20 1337           ITP Comments  Virtual Visit completed. Patient informed on EP and RD appointment and 6 Minute walk test. Patient also informed of patient health questionnaires on My Chart. Patient Verbalizes understanding. Visit diagnosis can be found  in [facility] 04/01/2020.          Comments: initial ITP

## 2020-04-11 NOTE — Progress Notes (Deleted)
Incomplete Session Note  Patient Details  Name: Melissa Lamb MRN: [id number] Date of Birth: 04-11-1961 Referring Provider:    Rema Fendt did not complete her rehab session. Her blood pressure was 80s/40s after water. Her doctor was contacted and patient scheduled appointment with him today. She left asymptomatic with instructions on blood pressure safety.

## 2020-04-14 DIAGNOSIS — E890 Postprocedural hypothyroidism: Secondary | ICD-10-CM | POA: Diagnosis not present

## 2020-04-14 DIAGNOSIS — C73 Malignant neoplasm of thyroid gland: Secondary | ICD-10-CM | POA: Diagnosis not present

## 2020-04-15 ENCOUNTER — Telehealth: Payer: Self-pay | Admitting: Family Medicine

## 2020-04-15 ENCOUNTER — Encounter: Payer: BC Managed Care – PPO | Admitting: *Deleted

## 2020-04-15 ENCOUNTER — Other Ambulatory Visit: Payer: Self-pay

## 2020-04-15 DIAGNOSIS — I252 Old myocardial infarction: Secondary | ICD-10-CM | POA: Diagnosis not present

## 2020-04-15 DIAGNOSIS — E119 Type 2 diabetes mellitus without complications: Secondary | ICD-10-CM | POA: Diagnosis not present

## 2020-04-15 DIAGNOSIS — Z7902 Long term (current) use of antithrombotics/antiplatelets: Secondary | ICD-10-CM | POA: Diagnosis not present

## 2020-04-15 DIAGNOSIS — Z7982 Long term (current) use of aspirin: Secondary | ICD-10-CM | POA: Diagnosis not present

## 2020-04-15 DIAGNOSIS — I10 Essential (primary) hypertension: Secondary | ICD-10-CM | POA: Diagnosis not present

## 2020-04-15 DIAGNOSIS — Z79890 Hormone replacement therapy (postmenopausal): Secondary | ICD-10-CM | POA: Diagnosis not present

## 2020-04-15 DIAGNOSIS — Z85850 Personal history of malignant neoplasm of thyroid: Secondary | ICD-10-CM | POA: Diagnosis not present

## 2020-04-15 DIAGNOSIS — Z7984 Long term (current) use of oral hypoglycemic drugs: Secondary | ICD-10-CM | POA: Diagnosis not present

## 2020-04-15 DIAGNOSIS — E785 Hyperlipidemia, unspecified: Secondary | ICD-10-CM | POA: Diagnosis not present

## 2020-04-15 DIAGNOSIS — Z79899 Other long term (current) drug therapy: Secondary | ICD-10-CM | POA: Diagnosis not present

## 2020-04-15 DIAGNOSIS — I213 ST elevation (STEMI) myocardial infarction of unspecified site: Secondary | ICD-10-CM

## 2020-04-15 LAB — GLUCOSE, CAPILLARY
Glucose-Capillary: 170 mg/dL — ABNORMAL HIGH (ref 70–99)
Glucose-Capillary: 112 mg/dL — ABNORMAL HIGH (ref 70–99)

## 2020-04-15 NOTE — Progress Notes (Signed)
Daily Session Note  Patient Details  Name: Melissa Lamb MRN: [id number] Date of Birth: Dec 21, 1960 Referring Provider:     Cardiac Rehab from 04/11/2020 in [facility]  Referring Provider  [facility]      Encounter Date: 04/15/2020  Check In: Session Check In - 04/15/20 1117      Check-In   Supervising physician immediately available to respond to emergencies  See telemetry face sheet for immediately available ER MD    Location  [facility]    Staff Present  Heath Lark, RN, BSN, CCRP;Joseph Hood RCP,RRT,BSRT;Jessica Baldwin, [place], [place], [place], CCET    Virtual Visit  No    Medication changes reported      No    Fall or balance concerns reported     No    Warm-up and Cool-down  Performed on first and last piece of equipment    Resistance Training Performed  Yes    VAD Patient?  No    PAD/SET Patient?  No      Pain Assessment   Currently in Pain?  No/denies          Social History   Tobacco Use  Smoking Status Never Smoker  Smokeless Tobacco Never Used    Goals Met:  Exercise tolerated well Personal goals reviewed No report of cardiac concerns or symptoms  Goals Unmet:  Not Applicable  Comments: First full day of exercise!  Patient was oriented to gym and equipment including functions, settings, policies, and procedures.  Patient's individual exercise prescription and treatment plan were reviewed.  All starting workloads were established based on the results of the 6 minute walk test done at initial orientation visit.  The plan for exercise progression was also introduced and progression will be customized based on patient's performance and goals.    Dr. Emily Filbert is Medical Director for [facility].

## 2020-04-15 NOTE — Telephone Encounter (Signed)
Recv'd records from [place] ([place])forwarded to Dr. Waunita Schooner 5/28/21fbg

## 2020-04-17 ENCOUNTER — Observation Stay
Admission: EM | Admit: 2020-04-17 | Discharge: 2020-04-18 | Disposition: A | Discharge status: Home / Self Care | Payer: BC Managed Care – PPO | Attending: Internal Medicine | Admitting: Internal Medicine

## 2020-04-17 ENCOUNTER — Other Ambulatory Visit: Payer: Self-pay

## 2020-04-17 ENCOUNTER — Emergency Department: Payer: BC Managed Care – PPO

## 2020-04-17 DIAGNOSIS — Z9049 Acquired absence of other specified parts of digestive tract: Secondary | ICD-10-CM | POA: Insufficient documentation

## 2020-04-17 DIAGNOSIS — I252 Old myocardial infarction: Secondary | ICD-10-CM | POA: Diagnosis not present

## 2020-04-17 DIAGNOSIS — R079 Chest pain, unspecified: Secondary | ICD-10-CM | POA: Diagnosis not present

## 2020-04-17 DIAGNOSIS — Z90710 Acquired absence of both cervix and uterus: Secondary | ICD-10-CM | POA: Insufficient documentation

## 2020-04-17 DIAGNOSIS — E785 Hyperlipidemia, unspecified: Secondary | ICD-10-CM

## 2020-04-17 DIAGNOSIS — I491 Atrial premature depolarization: Secondary | ICD-10-CM | POA: Insufficient documentation

## 2020-04-17 DIAGNOSIS — I10 Essential (primary) hypertension: Secondary | ICD-10-CM | POA: Insufficient documentation

## 2020-04-17 DIAGNOSIS — I25110 Atherosclerotic heart disease of native coronary artery with unstable angina pectoris: Secondary | ICD-10-CM

## 2020-04-17 DIAGNOSIS — Z8349 Family history of other endocrine, nutritional and metabolic diseases: Secondary | ICD-10-CM | POA: Diagnosis not present

## 2020-04-17 DIAGNOSIS — Z8049 Family history of malignant neoplasm of other genital organs: Secondary | ICD-10-CM | POA: Diagnosis not present

## 2020-04-17 DIAGNOSIS — R0602 Shortness of breath: Secondary | ICD-10-CM | POA: Diagnosis not present

## 2020-04-17 DIAGNOSIS — Z7984 Long term (current) use of oral hypoglycemic drugs: Secondary | ICD-10-CM | POA: Diagnosis not present

## 2020-04-17 DIAGNOSIS — E039 Hypothyroidism, unspecified: Secondary | ICD-10-CM

## 2020-04-17 DIAGNOSIS — Z8249 Family history of ischemic heart disease and other diseases of the circulatory system: Secondary | ICD-10-CM | POA: Insufficient documentation

## 2020-04-17 DIAGNOSIS — E890 Postprocedural hypothyroidism: Secondary | ICD-10-CM | POA: Insufficient documentation

## 2020-04-17 DIAGNOSIS — Z20822 Contact with and (suspected) exposure to covid-19: Secondary | ICD-10-CM | POA: Insufficient documentation

## 2020-04-17 DIAGNOSIS — I2510 Atherosclerotic heart disease of native coronary artery without angina pectoris: Secondary | ICD-10-CM

## 2020-04-17 DIAGNOSIS — I493 Ventricular premature depolarization: Principal | ICD-10-CM | POA: Insufficient documentation

## 2020-04-17 DIAGNOSIS — Z79899 Other long term (current) drug therapy: Secondary | ICD-10-CM | POA: Insufficient documentation

## 2020-04-17 DIAGNOSIS — Z7982 Long term (current) use of aspirin: Secondary | ICD-10-CM | POA: Diagnosis not present

## 2020-04-17 DIAGNOSIS — I200 Unstable angina: Secondary | ICD-10-CM | POA: Diagnosis not present

## 2020-04-17 DIAGNOSIS — Z803 Family history of malignant neoplasm of breast: Secondary | ICD-10-CM | POA: Insufficient documentation

## 2020-04-17 DIAGNOSIS — Z955 Presence of coronary angioplasty implant and graft: Secondary | ICD-10-CM | POA: Insufficient documentation

## 2020-04-17 DIAGNOSIS — Z888 Allergy status to other drugs, medicaments and biological substances status: Secondary | ICD-10-CM | POA: Diagnosis not present

## 2020-04-17 DIAGNOSIS — E1169 Type 2 diabetes mellitus with other specified complication: Secondary | ICD-10-CM | POA: Insufficient documentation

## 2020-04-17 DIAGNOSIS — I249 Acute ischemic heart disease, unspecified: Secondary | ICD-10-CM | POA: Diagnosis not present

## 2020-04-17 DIAGNOSIS — Z85850 Personal history of malignant neoplasm of thyroid: Secondary | ICD-10-CM | POA: Diagnosis not present

## 2020-04-17 DIAGNOSIS — Z833 Family history of diabetes mellitus: Secondary | ICD-10-CM | POA: Diagnosis not present

## 2020-04-17 DIAGNOSIS — E118 Type 2 diabetes mellitus with unspecified complications: Secondary | ICD-10-CM

## 2020-04-17 LAB — CBC
HCT: 37.1 % (ref 36.0–46.0)
Hemoglobin: 12 g/dL (ref 12.0–15.0)
MCH: 30.8 pg (ref 26.0–34.0)
MCHC: 32.3 g/dL (ref 30.0–36.0)
MCV: 95.4 fL (ref 80.0–100.0)
Platelets: 364 10*3/uL (ref 150–400)
RBC: 3.89 MIL/uL (ref 3.87–5.11)
RDW: 13.4 % (ref 11.5–15.5)
WBC: 5.2 10*3/uL (ref 4.0–10.5)
nRBC: 0 % (ref 0.0–0.2)

## 2020-04-17 LAB — BASIC METABOLIC PANEL
Anion gap: 9 (ref 5–15)
BUN: 13 mg/dL (ref 6–20)
CO2: 25 mmol/L (ref 22–32)
Calcium: 9.3 mg/dL (ref 8.9–10.3)
Chloride: 106 mmol/L (ref 98–111)
Creatinine, Ser: 0.99 mg/dL (ref 0.44–1.00)
GFR calc Af Amer: >60 mL/min (ref >60)
GFR calc non Af Amer: >60 mL/min (ref >60)
Glucose, Bld: 176 mg/dL — ABNORMAL HIGH (ref 70–99)
Potassium: 4 mmol/L (ref 3.5–5.1)
Sodium: 140 mmol/L (ref 135–145)

## 2020-04-17 LAB — SARS CORONAVIRUS 2 BY RT PCR (HOSPITAL ORDER, PERFORMED IN ~~LOC~~ HOSPITAL LAB): SARS Coronavirus 2: NEGATIVE

## 2020-04-17 LAB — TROPONIN I (HIGH SENSITIVITY)
Troponin I (High Sensitivity): 33 ng/L — ABNORMAL HIGH (ref <18)
Troponin I (High Sensitivity): 26 ng/L — ABNORMAL HIGH (ref <18)

## 2020-04-17 MED ORDER — GLIPIZIDE 5 MG PO TABS
7.5000 mg | ORAL_TABLET | Freq: Every day | ORAL | Status: DC
  Administered 2020-04-18: 7.5 mg via ORAL
  Filled 2020-04-17 (×2): qty 1.5

## 2020-04-17 MED ORDER — ALUM & MAG HYDROXIDE-SIMETH 200-200-20 MG/5ML PO SUSP
30.0000 mL | Freq: Once | ORAL | Status: AC
  Administered 2020-04-17: 30 mL via ORAL
  Filled 2020-04-17: qty 30

## 2020-04-17 MED ORDER — ROSUVASTATIN CALCIUM 20 MG PO TABS
40.0000 mg | ORAL_TABLET | Freq: Every day | ORAL | Status: DC
  Administered 2020-04-17 – 2020-04-18 (×2): 40 mg via ORAL
  Filled 2020-04-17 (×2): qty 4
  Filled 2020-04-17 (×2): qty 2

## 2020-04-17 MED ORDER — SODIUM CHLORIDE 0.9% FLUSH
3.0000 mL | Freq: Once | INTRAVENOUS | Status: AC
  Administered 2020-04-17: 3 mL via INTRAVENOUS

## 2020-04-17 MED ORDER — METOPROLOL SUCCINATE ER 25 MG PO TB24
25.0000 mg | ORAL_TABLET | Freq: Every day | ORAL | Status: DC
  Administered 2020-04-17: 25 mg via ORAL
  Filled 2020-04-17: qty 1

## 2020-04-17 MED ORDER — ZOLPIDEM TARTRATE 5 MG PO TABS: 5.0000 mg | ORAL_TABLET | Freq: Every evening | ORAL | Status: DC | PRN

## 2020-04-17 MED ORDER — ONDANSETRON HCL 4 MG/2ML IJ SOLN: 4.0000 mg | Freq: Four times a day (QID) | INTRAMUSCULAR | Status: DC | PRN

## 2020-04-17 MED ORDER — METOPROLOL SUCCINATE ER 50 MG PO TB24
50.0000 mg | ORAL_TABLET | Freq: Every day | ORAL | Status: DC
  Administered 2020-04-18: 50 mg via ORAL
  Filled 2020-04-17: qty 1

## 2020-04-17 MED ORDER — METOPROLOL TARTRATE 25 MG PO TABS
25.0000 mg | ORAL_TABLET | Freq: Once | ORAL | Status: AC
  Administered 2020-04-17: 25 mg via ORAL
  Filled 2020-04-17: qty 1

## 2020-04-17 MED ORDER — METOPROLOL SUCCINATE ER 50 MG PO TB24: 50.0000 mg | ORAL_TABLET | Freq: Every day | ORAL | 0 refills | Status: DC

## 2020-04-17 MED ORDER — ACETAMINOPHEN 325 MG PO TABS: 650.0000 mg | ORAL_TABLET | ORAL | Status: DC | PRN

## 2020-04-17 MED ORDER — ALPRAZOLAM 0.25 MG PO TABS: 0.2500 mg | ORAL_TABLET | Freq: Two times a day (BID) | ORAL | Status: DC | PRN

## 2020-04-17 MED ORDER — TICAGRELOR 90 MG PO TABS
90.0000 mg | ORAL_TABLET | Freq: Two times a day (BID) | ORAL | Status: DC
  Administered 2020-04-17 – 2020-04-18 (×3): 90 mg via ORAL
  Filled 2020-04-17 (×3): qty 1

## 2020-04-17 MED ORDER — ASPIRIN 81 MG PO CHEW
324.0000 mg | CHEWABLE_TABLET | Freq: Once | ORAL | Status: AC
  Administered 2020-04-17: 324 mg via ORAL
  Filled 2020-04-17: qty 4

## 2020-04-17 MED ORDER — ENOXAPARIN SODIUM 40 MG/0.4ML ~~LOC~~ SOLN
40.0000 mg | SUBCUTANEOUS | Status: DC
  Administered 2020-04-17: 40 mg via SUBCUTANEOUS
  Filled 2020-04-17: qty 0.4

## 2020-04-17 MED ORDER — ASPIRIN 81 MG PO CHEW
81.0000 mg | CHEWABLE_TABLET | Freq: Every day | ORAL | Status: DC
  Administered 2020-04-17 – 2020-04-18 (×2): 81 mg via ORAL
  Filled 2020-04-17 (×2): qty 1

## 2020-04-17 MED ORDER — GLIPIZIDE 5 MG PO TABS
7.5000 mg | ORAL_TABLET | Freq: Every day | ORAL | Status: DC
  Filled 2020-04-17: qty 1.5

## 2020-04-17 MED ORDER — MORPHINE SULFATE (PF) 2 MG/ML IV SOLN: 2.0000 mg | INTRAVENOUS | Status: DC | PRN

## 2020-04-17 MED ORDER — PNEUMOCOCCAL VAC POLYVALENT 25 MCG/0.5ML IJ INJ: 0.5000 mL | INJECTION | INTRAMUSCULAR | Status: DC

## 2020-04-17 MED ORDER — LEVOTHYROXINE SODIUM 88 MCG PO TABS
88.0000 ug | ORAL_TABLET | Freq: Every day | ORAL | Status: DC
  Administered 2020-04-17 – 2020-04-18 (×2): 88 ug via ORAL
  Filled 2020-04-17 (×2): qty 1

## 2020-04-17 MED ORDER — FENOFIBRATE 160 MG PO TABS
160.0000 mg | ORAL_TABLET | Freq: Every day | ORAL | Status: DC
  Administered 2020-04-17 – 2020-04-18 (×2): 160 mg via ORAL
  Filled 2020-04-17 (×2): qty 1

## 2020-04-17 MED ORDER — LIDOCAINE VISCOUS HCL 2 % MT SOLN
15.0000 mL | Freq: Once | OROMUCOSAL | Status: AC
  Administered 2020-04-17: 15 mL via ORAL
  Filled 2020-04-17: qty 15

## 2020-04-17 MED ORDER — SODIUM CHLORIDE 0.9 % IV SOLN: INTRAVENOUS | Status: DC

## 2020-04-17 NOTE — H&P (Signed)
[place] [facility] [place] NAME: Melissa Lamb    MR#:  [id number]  DATE OF BIRTH:  1961/11/11  DATE OF ADMISSION:  04/17/2020  PRIMARY CARE PHYSICIAN: [name], MD   REQUESTING/REFERRING PHYSICIAN: Lurline Hare, MD CHIEF COMPLAINT:   Chief Complaint  Patient presents with  . Chest Pain    HISTORY OF PRESENT ILLNESS:  Melissa Lamb  is a 59 y.o. Caucasian female with a known history of type diabetes mellitus, hypertension and dyslipidemia, who presented to the emergency room with acute onset of exertional chest pain felt as fluttering and graded 8/10 in severity with associated dyspnea without nausea or vomiting.  No cough or wheezing or hemoptysis.  No leg pain or edema.  No fever or chills.  No bleeding diathesis.   The patient underwent PCI and insertion of two stents on 5/14 by [name].   Upon presentation to the emergency room, see CBC was unremarkable and BMP was remarkable for blood glucose of 176. COVID-19 PCR came back negative. Two-view chest x-ray showed no acute cardiopulmonary disease and EKG showed normal sinus rhythm  with no acute changes.  The patient was given 4 baby aspirin.  She was chest pain-free during my interview.  She will be placed in observation on telemetry for further evaluation and monitoring. PAST MEDICAL HISTORY:   Past Medical History:  Diagnosis Date  . Diabetes mellitus without complication ([place])   . History of chickenpox   . Hyperlipidemia   . Hypertension   . Thyroid cancer ([place])   Coronary artery disease status post PCI and 2 stents on 04/01/2020  PAST SURGICAL HISTORY:   Past Surgical History:  Procedure Laterality Date  . ABDOMINAL HYSTERECTOMY  2014   has both ovaries still.  . APPENDECTOMY  2019  . BREAST BIOPSY Right    benign-seed was put in to mark it  . BREAST REDUCTION SURGERY    . CHOLECYSTECTOMY  2019  . CORONARY/GRAFT ACUTE MI REVASCULARIZATION N/A 04/01/2020   Procedure:  Coronary/Graft Acute MI Revascularization;  Surgeon: Yolonda Kida, MD;  Location: [place] CV [facility];  Service: Cardiovascular;  Laterality: N/A;  . LEFT HEART CATH AND CORONARY ANGIOGRAPHY N/A 04/01/2020   Procedure: LEFT HEART CATH AND CORONARY ANGIOGRAPHY;  Surgeon: Yolonda Kida, MD;  Location: [place] [facility];  Service: Cardiovascular;  Laterality: N/A;  . REDUCTION MAMMAPLASTY    . THYROIDECTOMY  2014   papillary well-differentiated lymph nodes.  . TONSILLECTOMY AND ADENOIDECTOMY      SOCIAL HISTORY:   Social History   Tobacco Use  . Smoking status: Never Smoker  . Smokeless tobacco: Never Used  Substance Use Topics  . Alcohol use: Yes    Comment: rare    FAMILY HISTORY:   Family History  Problem Relation Age of Onset  . Diabetes Mother   . Hyperlipidemia Mother   . Hypertension Mother   . Hyperlipidemia Father   . Heart disease Father   . Heart attack Father 4       had defibrillator put in  . Diabetes Sister   . Hypertension Sister   . Diabetes Brother   . Hypertension Brother   . Cervical cancer Maternal Aunt   . Uterine cancer Maternal Grandmother   . Breast cancer Maternal Grandmother   . Diabetes Sister   . Hypertension Sister   . Breast cancer Maternal Aunt 58    DRUG ALLERGIES:   Allergies  Allergen Reactions  . Lisinopril  Tinitus and Cough    REVIEW OF SYSTEMS:   ROS As per history of present illness. All pertinent systems were reviewed above. Constitutional,  HEENT, cardiovascular, respiratory, GI, GU, musculoskeletal, neuro, psychiatric, endocrine,  integumentary and hematologic systems were reviewed and are otherwise  negative/unremarkable except for positive findings mentioned above in the HPI.   MEDICATIONS AT HOME:   Prior to Admission medications   Medication Sig Start Date End Date Taking? Authorizing Provider  aspirin 81 MG chewable tablet Chew 1 tablet (81 mg total) by mouth daily. Need to take for at least a  year. 04/04/20 04/04/21  Enzo Bi, MD  Cholecalciferol (VITAMIN D-1000 MAX ST) 25 MCG (1000 UT) tablet Take 2,000 Units by mouth daily.     [provider]  fenofibrate 160 MG tablet Take 1 tablet (160 mg total) by mouth daily. 10/20/19   [name], MD  glipiZIDE (GLUCOTROL) 5 MG tablet Take 1.5 tablets (7.5 mg total) by mouth daily. 04/06/20   [name], MD  levothyroxine (SYNTHROID) 88 MCG tablet Take 88 mcg by mouth daily.     [provider]  metFORMIN (GLUCOPHAGE) 500 MG tablet Take 1 tablet (500 mg total) by mouth 2 (two) times daily. 04/06/20   [name], MD  metoprolol succinate (TOPROL-XL) 25 MG 24 hr tablet Take 1 tablet (25 mg total) by mouth daily. 10/20/19   [name], MD  rosuvastatin (CRESTOR) 40 MG tablet Take 1 tablet (40 mg total) by mouth daily. 04/04/20 07/03/20  Enzo Bi, MD  ticagrelor (BRILINTA) 90 MG TABS tablet Take 1 tablet (90 mg total) by mouth 2 (two) times daily. Need to take this for at least a year. 04/03/20 04/03/21  Enzo Bi, MD      VITAL SIGNS:  Blood pressure 135/72, pulse 61, resp. rate 14, height 5\' 8"  (1.727 m), weight 65.3 kg, SpO2 99 %.  PHYSICAL EXAMINATION:  Physical Exam  GENERAL:  59 y.o.-year-old Caucasian female patient lying in the bed with no acute distress.  EYES: Pupils equal, round, reactive to light and accommodation. No scleral icterus. Extraocular muscles intact.  HEENT: Head atraumatic, normocephalic. Oropharynx and nasopharynx clear.  NECK:  Supple, no jugular venous distention. No thyroid enlargement, no tenderness.  LUNGS: Normal breath sounds bilaterally, no wheezing, rales,rhonchi or crepitation. No use of accessory muscles of respiration.  CARDIOVASCULAR: Regular rate and rhythm, S1, S2 normal. No murmurs, rubs, or gallops.  ABDOMEN: Soft, nondistended, nontender. Bowel sounds present. No organomegaly or mass.  EXTREMITIES: No pedal edema, cyanosis, or clubbing.  NEUROLOGIC: Cranial nerves II  through XII are intact. Muscle strength 5/5 in all extremities. Sensation intact. Gait not checked.  PSYCHIATRIC: The patient is alert and oriented x 3.  Normal affect and good eye contact. SKIN: No obvious rash, lesion, or ulcer.   LABORATORY PANEL:   CBC Recent Labs  Lab 04/17/20 0044  WBC 5.2  HGB 12.0  HCT 37.1  PLT 364   ------------------------------------------------------------------------------------------------------------------  Chemistries  Recent Labs  Lab 04/17/20 0044  NA 140  K 4.0  CL 106  CO2 25  GLUCOSE 176*  BUN 13  CREATININE 0.99  CALCIUM 9.3   ------------------------------------------------------------------------------------------------------------------  Cardiac Enzymes No results for input(s): TROPONINI in the last 168 hours. ------------------------------------------------------------------------------------------------------------------  RADIOLOGY:  DG Chest 2 View  Result Date: 04/17/2020 CLINICAL DATA:  Chest pain and shortness of breath. EXAM: CHEST - 2 VIEW COMPARISON:  04/01/2020 FINDINGS: The cardiomediastinal contours are normal. Coronary stents are visualized. The  lungs are clear. Pulmonary vasculature is normal. Calcified granuloma in the right upper lobe. No consolidation, pleural effusion, or pneumothorax. No acute osseous abnormalities are seen. IMPRESSION: No acute chest findings. Electronically Signed   By: Keith Rake M.D.   On: 04/17/2020 01:22      IMPRESSION AND PLAN:  1. Chest pain, rule out acute coronary syndrome with history of coronary artery disease status post PCI and two stents on 04/01/2020.. -The patient will be admitted to an observation telemetry bed.   -Will follow serial cardiac enzymes and EKGs.  -We will obtain a cardiology consult in a.m. for further cardiac risk stratification.    -The patient will be placed on aspirin as well as p.r.n. sublingual nitroglycerin and morphine sulfate for pain. -I  notified [name] about the patient.  2. Hypertension. -We will continue Toprol-XL and Cozaar.   3.  Dyslipidemia. -We will continue statin therapy as well as fenofibrate.   4.Type 2 diabetes mellitus. The patient will be placed on supplemental coverage with NovoLog and will hold off his Glucophage and  5. Hypothyroidism. -We will continue Synthroid and check TSH level.  6. DVT prophylaxis. -Subcutaneous Lovenox   All the records are reviewed and case discussed with ED provider. The plan of care was discussed in details with the patient (and family). I answered all questions. The patient agreed to proceed with the above mentioned plan. Further management will depend upon hospital course.   CODE STATUS: Full code  Status is: Observation  The patient remains OBS appropriate and will d/c before 2 midnights.  Dispo: The patient is from: Home              Anticipated d/c is to: Home              Anticipated d/c date is: 1 day              Patient currently is not medically stable to d/c.   TOTAL TIME TAKING CARE OF THIS PATIENT: 55 minutes.    Christel Mormon M.D on 04/17/2020 at 5:19 AM  [facility]   From 7 PM-7 AM, contact night-coverage [email or web address]  CC: Primary care physician; [name], MD   Note: This dictation was prepared with Dragon dictation along with smaller phrase technology. Any transcriptional errors that result from this process are unintentional.

## 2020-04-17 NOTE — Consult Note (Signed)
[facility] Consultation Note  Patient ID: Melissa Lamb, MRN: [id number], DOB/AGE: 1961/08/03 59 y.o. Admit date: 04/17/2020   Date of Consult: 04/17/2020 Primary Physician: [name], MD Primary Cardiologist: [facility]  Chief Complaint:  Chief Complaint  Patient presents with  . Chest Pain   Reason for Consult: Chest pain palpitations  HPI: 59 y.o. female with known diabetes hypertension hyperlipidemia having a non-ST elevation myocardial infarction 2 weeks prior with PCI and stent placement which was performed well and patient is doing fairly well with appropriate medication management including high intensity cholesterol therapy dual antiplatelet therapy diabetes medication management beta-blocker and ACE inhibitor.  The patient had had some shortness of breath and palpitations irregular heartbeats and was concerned.  She was seen in the emergency room with some normal sinus rhythm with ventricular couplets and occasional preventricular contractions.  These were the palpitations that she was concerned about she has had troponin level of 33 and no current evidence of myocardial infarction.  All of her symptoms have abated and she is feeling much better at this time with no further significant symptoms.  Past Medical History:  Diagnosis Date  . Diabetes mellitus without complication ([place])   . History of chickenpox   . Hyperlipidemia   . Hypertension   . Thyroid cancer [facility])       Surgical History:  Past Surgical History:  Procedure Laterality Date  . ABDOMINAL HYSTERECTOMY  2014   has both ovaries still.  . APPENDECTOMY  2019  . BREAST BIOPSY Right    benign-seed was put in to mark it  . BREAST REDUCTION SURGERY    . CHOLECYSTECTOMY  2019  . CORONARY/GRAFT ACUTE MI REVASCULARIZATION N/A 04/01/2020   Procedure: Coronary/Graft Acute MI Revascularization;  Surgeon: Yolonda Kida, MD;  Location: [place] [facility];  Service: Cardiovascular;  Laterality:  N/A;  . LEFT HEART CATH AND CORONARY ANGIOGRAPHY N/A 04/01/2020   Procedure: LEFT HEART CATH AND CORONARY ANGIOGRAPHY;  Surgeon: Yolonda Kida, MD;  Location: [place] [facility];  Service: Cardiovascular;  Laterality: N/A;  . REDUCTION MAMMAPLASTY    . THYROIDECTOMY  2014   papillary well-differentiated lymph nodes.  . TONSILLECTOMY AND ADENOIDECTOMY       Home Meds: Prior to Admission medications   Medication Sig Start Date End Date Taking? Authorizing Provider  aspirin 81 MG chewable tablet Chew 1 tablet (81 mg total) by mouth daily. Need to take for at least a year. 04/04/20 04/04/21  Enzo Bi, MD  Cholecalciferol (VITAMIN D-1000 MAX ST) 25 MCG (1000 UT) tablet Take 2,000 Units by mouth daily.     [provider]  fenofibrate 160 MG tablet Take 1 tablet (160 mg total) by mouth daily. 10/20/19   [name], MD  glipiZIDE (GLUCOTROL) 5 MG tablet Take 1.5 tablets (7.5 mg total) by mouth daily. 04/06/20   [name], MD  levothyroxine (SYNTHROID) 88 MCG tablet Take 88 mcg by mouth daily.     [provider]  metFORMIN (GLUCOPHAGE) 500 MG tablet Take 1 tablet (500 mg total) by mouth 2 (two) times daily. 04/06/20   [name], MD  metoprolol succinate (TOPROL-XL) 25 MG 24 hr tablet Take 1 tablet (25 mg total) by mouth daily. 10/20/19   [name], MD  rosuvastatin (CRESTOR) 40 MG tablet Take 1 tablet (40 mg total) by mouth daily. 04/04/20 07/03/20  Enzo Bi, MD  ticagrelor (BRILINTA) 90 MG TABS tablet Take 1 tablet (90 mg total)  by mouth 2 (two) times daily. Need to take this for at least a year. 04/03/20 04/03/21  Enzo Bi, MD    Inpatient Medications:  . aspirin  81 mg Oral Daily  . enoxaparin (LOVENOX) injection  40 mg Subcutaneous Q24H  . fenofibrate  160 mg Oral Daily  . glipiZIDE  7.5 mg Oral QAC breakfast  . levothyroxine  88 mcg Oral Daily  . metoprolol succinate  25 mg Oral Daily  . [START ON 04/18/2020] pneumococcal 23 valent vaccine  0.5 mL  Intramuscular Tomorrow-1000  . rosuvastatin  40 mg Oral Daily  . ticagrelor  90 mg Oral BID   . sodium chloride 100 mL/hr at 04/17/20 1530    Allergies:  Allergies  Allergen Reactions  . Lisinopril Tinitus and Cough    Social History   Socioeconomic History  . Marital status: Widowed    Spouse name: Vicente Males  . Number of children: 2  . Years of education: Some college  . Highest education level: Not on file  Occupational History  . Not on file  Tobacco Use  . Smoking status: Never Smoker  . Smokeless tobacco: Never Used  Substance and Sexual Activity  . Alcohol use: Yes    Comment: rare  . Drug use: Never  . Sexual activity: Not Currently  Other Topics Concern  . Not on file  Social History Narrative   10/20/19   From: [place]   Living: with son - [name]   Work: not currently, caring for her grandchild   Widowed: husband, Vicente Males, passed away in 2019/01/25 - Dr who trained at [facility]      Family: Daughter - Colletta [place] (nearby), has 1 grandchild - Minette Brine 2018/01/24)      Enjoys: crafts, doing projects, gardening      Exercise: not as often, tries to walk   Diet: well rounded, does eat some unhealthy food, tries to follow diabetic diet      Safety   Seat belts: Yes    Guns: No   Safe in relationships: Yes    Social Determinants of Radio broadcast assistant Strain: Low Risk   . Difficulty of Paying Living Expenses: Not hard at all  Food Insecurity:   . Worried About Charity fundraiser in the Last Year:   . Arboriculturist in the Last Year:   Transportation Needs:   . Film/video editor (Medical):   Marland Kitchen Lack of Transportation (Non-Medical):   Physical Activity:   . Days of Exercise per Week:   . Minutes of Exercise per Session:   Stress:   . Feeling of Stress :   Social Connections:   . Frequency of Communication with Friends and Family:   . Frequency of Social Gatherings with Friends and Family:   . Attends Religious Services:   . Active Member of Clubs  or Organizations:   . Attends Archivist Meetings:   Marland Kitchen Marital Status:   Intimate Partner Violence:   . Fear of Current or Ex-Partner:   . Emotionally Abused:   Marland Kitchen Physically Abused:   . Sexually Abused:      Family History  Problem Relation Age of Onset  . Diabetes Mother   . Hyperlipidemia Mother   . Hypertension Mother   . Hyperlipidemia Father   . Heart disease Father   . Heart attack Father 5       had defibrillator put in  . Diabetes Sister   . Hypertension Sister   .  Diabetes Brother   . Hypertension Brother   . Cervical cancer Maternal Aunt   . Uterine cancer Maternal Grandmother   . Breast cancer Maternal Grandmother   . Diabetes Sister   . Hypertension Sister   . Breast cancer Maternal Aunt 58     Review of Systems Positive for palpitations shortness of breath Negative for: General:  chills, fever, night sweats or weight changes.  Cardiovascular: PND orthopnea syncope dizziness  Dermatological skin lesions rashes Respiratory: Cough congestion Urologic: Frequent urination urination at night and hematuria Abdominal: negative for nausea, vomiting, diarrhea, bright red blood per rectum, melena, or hematemesis Neurologic: negative for visual changes, and/or hearing changes  All other systems reviewed and are otherwise negative except as noted above.  Labs: No results for input(s): CKTOTAL, CKMB, TROPONINI in the last 72 hours. Lab Results  Component Value Date   WBC 5.2 04/17/2020   HGB 12.0 04/17/2020   HCT 37.1 04/17/2020   MCV 95.4 04/17/2020   PLT 364 04/17/2020    Recent Labs  Lab 04/17/20 0044  NA 140  K 4.0  CL 106  CO2 25  BUN 13  CREATININE 0.99  CALCIUM 9.3  GLUCOSE 176*   Lab Results  Component Value Date   CHOL 182 04/02/2020   HDL 29 (L) 04/02/2020   LDLCALC 96 04/02/2020   TRIG 284 (H) 04/02/2020   No results found for: DDIMER  Radiology/Studies:  DG Chest 2 View  Result Date: 04/17/2020 CLINICAL DATA:  Chest  pain and shortness of breath. EXAM: CHEST - 2 VIEW COMPARISON:  04/01/2020 FINDINGS: The cardiomediastinal contours are normal. Coronary stents are visualized. The lungs are clear. Pulmonary vasculature is normal. Calcified granuloma in the right upper lobe. No consolidation, pleural effusion, or pneumothorax. No acute osseous abnormalities are seen. IMPRESSION: No acute chest findings. Electronically Signed   By: Keith Rake M.D.   On: 04/17/2020 01:22   CARDIAC CATHETERIZATION  Result Date: 04/04/2020  Prox Cx to Mid Cx lesion is 100% stenosed.  Prox LAD to Mid LAD lesion is 75% stenosed.  A drug-eluting stent was successfully placed using a STENT RESOLUTE ONYX 2.5X30.  Post intervention, there is a 0% residual stenosis.  A drug-eluting stent was successfully placed using a STENT RESOLUTE ONYX 3.5X15.  Post intervention, there is a 0% residual stenosis.  Conclusion STEMI presentation posterior MI Diagnostic cath which shows occlusion of mid AV groove circumflex RRA 100% Normal left main 75% mid LAD lesion Normal right coronary artery Preserved overall left ventricular function with mild inferior lateral hypokinesis Intervention Successful PCI and stent of mid AV groove circumflex with DES 2.5 x 30 mm resolute Onyx to 13 atm From TIMI 0 flow to TIMI-3 flow Successful PCI and stent of mid LAD with DES 3.5 x 15 mm resolute Onyx to 13 atm Minx deployed Aggrastat for 12 to 18 hours post procedure   DG Chest Port 1 View  Result Date: 04/01/2020 CLINICAL DATA:  Shortness of breath over month and a half since COVID shots. EXAM: PORTABLE CHEST 1 VIEW COMPARISON:  None FINDINGS: Cardiomediastinal contours and hilar structures are unremarkable. Lungs are clear. Visualized skeletal structures are unremarkable. Pacer defibrillator pad overlies the LEFT chest. IMPRESSION: No acute cardiopulmonary disease. Electronically Signed   By: Zetta Bills M.D.   On: 04/01/2020 18:48   ECHOCARDIOGRAM  COMPLETE  Result Date: 04/03/2020    ECHOCARDIOGRAM REPORT   Patient Name:   LENNETTE FADER Date of Exam: 04/03/2020 Medical Rec #:  [id number]  Height:       68.0 in Accession #:    [id number]        Weight:       143.9 lb Date of Birth:  10-15-1961         BSA:          1.777 m Patient Age:    51 years          BP:           103/63 mmHg Patient Gender: F                 HR:           89 bpm. Exam Location:  [facility] Procedure: 2D Echo Indications:     NSTEMI I21.4  History:         Patient has no prior history of Echocardiogram examinations.  Sonographer:     Arville Go RDCS Referring Phys:  [id number] [place] Diagnosing Phys: Yolonda Kida MD  Sonographer Comments: Suboptimal parasternal window and Technically difficult study due to poor echo windows. IMPRESSIONS  1. Left ventricular ejection fraction, by estimation, is 55 to 60%. The left ventricle has normal function. The left ventricle demonstrates regional wall motion abnormalities (see scoring diagram/findings for description). Left ventricular diastolic parameters are consistent with Grade I diastolic dysfunction (impaired relaxation).  2. Right ventricular systolic function is normal. The right ventricular size is normal.  3. The mitral valve is grossly normal. Trivial mitral valve regurgitation.  4. The aortic valve is grossly normal. Aortic valve regurgitation is trivial. FINDINGS  Left Ventricle: Left ventricular ejection fraction, by estimation, is 55 to 60%. The left ventricle has normal function. The left ventricle demonstrates regional wall motion abnormalities. The left ventricular internal cavity size was normal in size. There is no left ventricular hypertrophy. Left ventricular diastolic parameters are consistent with Grade I diastolic dysfunction (impaired relaxation).  LV Wall Scoring: The anterior wall and mid inferolateral segment are hypokinetic. Right Ventricle: The right ventricular size is normal. No increase in right  ventricular wall thickness. Right ventricular systolic function is normal. Left Atrium: Left atrial size was normal in size. Right Atrium: Right atrial size was normal in size. Pericardium: The pericardium was not assessed. Mitral Valve: The mitral valve is grossly normal. Trivial mitral valve regurgitation. Tricuspid Valve: The tricuspid valve is normal in structure. Tricuspid valve regurgitation is not demonstrated. Aortic Valve: The aortic valve is grossly normal. Aortic valve regurgitation is trivial. Aortic valve peak gradient measures 6.8 mmHg. Pulmonic Valve: The pulmonic valve was grossly normal. Pulmonic valve regurgitation is not visualized. Aorta: The aortic arch was not well visualized. IAS/Shunts: No atrial level shunt detected by color flow Doppler.  LEFT VENTRICLE PLAX 2D LVIDd:         3.89 cm  Diastology LVIDs:         2.50 cm  LV e' lateral:   3.81 cm/s LV PW:         1.42 cm  LV E/e' lateral: 17.8 LV IVS:        1.01 cm  LV e' medial:    5.11 cm/s LVOT diam:     1.80 cm  LV E/e' medial:  13.2 LV SV:         46 LV SV Index:   26 LVOT Area:     2.54 cm  RIGHT VENTRICLE RV Basal diam:  2.18 cm RV S prime:     14.60 cm/s TAPSE (M-mode): 1.9 cm LEFT  ATRIUM             Index       RIGHT ATRIUM          Index LA diam:        3.90 cm 2.19 cm/m  RA Area:     7.74 cm LA Vol (A2C):   41.7 ml 23.47 ml/m RA Volume:   13.60 ml 7.65 ml/m LA Vol (A4C):   21.0 ml 11.82 ml/m LA Biplane Vol: 29.7 ml 16.71 ml/m  AORTIC VALVE AV Area (Vmax): 1.78 cm AV Vmax:        130.00 cm/s AV Peak Grad:   6.8 mmHg LVOT Vmax:      90.70 cm/s LVOT Vmean:     59.400 cm/s LVOT VTI:       0.181 m  AORTA Ao Root diam: 2.90 cm MITRAL VALVE               TRICUSPID VALVE MV Area (PHT): 5.38 cm    TV Peak grad:   16.2 mmHg MV Decel Time: 141 msec    TV Vmax:        2.01 m/s MV E velocity: 67.70 cm/s MV A velocity: 87.00 cm/s  SHUNTS MV E/A ratio:  0.78        Systemic VTI:  0.18 m                            Systemic Diam: 1.80 cm  Dwayne D Callwood MD Electronically signed by Yolonda Kida MD Signature Date/Time: 04/03/2020/4:07:54 PM    Final     EKG: Normal sinus rhythm with lateral T wave inversion consistent with previous myocardial infarction  Weights: Filed Weights   04/17/20 0027  Weight: 65.3 kg     Physical Exam: Blood pressure 108/68, pulse 66, temperature 98.9 F (37.2 C), temperature source Oral, resp. rate 14, height '5\' 8"'$  (1.727 m), weight 65.3 kg, SpO2 97 %. Body mass index is 21.89 kg/m. General: Well developed, well nourished, in no acute distress. Head eyes ears nose throat: Normocephalic, atraumatic, sclera non-icteric, no xanthomas, nares are without discharge. No apparent thyromegaly and/or mass  Lungs: Normal respiratory effort.  no wheezes, no rales, no rhonchi.  Heart: RRR with normal S1 S2. no murmur gallop, no rub, PMI is normal size and placement, carotid upstroke normal without bruit, jugular venous pressure is normal Abdomen: Soft, non-tender, non-distended with normoactive bowel sounds. No hepatomegaly. No rebound/guarding. No obvious abdominal masses. Abdominal aorta is normal size without bruit Extremities: No edema. no cyanosis, no clubbing, no ulcers  Peripheral : 2+ bilateral upper extremity pulses, 2+ bilateral femoral pulses, 2+ bilateral dorsal pedal pulse Neuro: Alert and oriented. No facial asymmetry. No focal deficit. Moves all extremities spontaneously. Musculoskeletal: Normal muscle tone without kyphosis Psych:  Responds to questions appropriately with a normal affect.    Assessment: 59 year old female with hypertension hyperlipidemia diabetes with non-ST elevation myocardial infarction several weeks prior having palpitations without current evidence of congestive heart failure new EKG changes or myocardial infarction  Plan: 1.  Continue observation for rhythm disturbances or other changes requiring further intervention 2.  Continue high intensity cholesterol  therapy 3.  Continue dual antiplatelet therapy 3.  Continue metoprolol but no increase in dose at this time due to concerns of bradycardia although will observe and potentially treat with higher dose if necessary 4.  Begin ambulation and follow for improvements of symptoms and possibly discharge home  tomorrow if ambulating well and no further symptoms  Signed, Corey Skains M.D. [facility] 04/17/2020, 5:44 PM

## 2020-04-17 NOTE — Progress Notes (Addendum)
Ms Melissa Lamb is a 59 yr old who presented to [facility] ED with complaints of "fluttering" in her chest intermittently of 1-2 weeks duration with acute onset of chest pain with exertion yesterday. The patient underwent placement of stents x 2 on 04/01/2020 by [name].   In the ED her troponins were modestly elevated, but have been trending down. EKg in the ED demonstrated sinus rhythm with t-wave changes consistent with inferior ischemia. There were also PAC's and evidence of aberrant conduction. Repeat EKG this morning demonstrates inverted lateral T waves. Cardiology has been consulted. The patient is continuing to receive ASA 81 mg daily, Toprol-XL 25 mg daily, Crestor 40 mg daily, and Brilinta 90 mg twice daily. She is being monitored on telemetry. Vital signs are stable.  She was admitted earlier this morning by my colleague Dr. Sidney Ace.  The patient is resting quietly. No new complaints. Heart and lung sounds are within normal limits. Abdomen is soft, non-tender, non-distended. Extremities are without cyanosis, clubbing, and edema.  ADDENDUM:  [name] contacted me at 5:50 to tell me that the patient was okay to discharge to home. The patient states that she is uncomfortable discharging so late in the day, and that she is still having "fluttering" feelings. [name] told me in a secure chat that she is to discharge on her current medications, except that he would like her metoprolol to be increased to 50 mg. She should follow up with [facility] in one week.

## 2020-04-17 NOTE — ED Notes (Signed)
Pt states she has had a heart attack in the past. Pt states yesterday at noon she was out walking and then after that felt a little weird in her chest. Pt states her blood pressure also elevated. Pt denies current pain. Pt resting in room. Provider at bedside.

## 2020-04-17 NOTE — ED Triage Notes (Signed)
Patient c/o medial chest pain and SOB. Patient seen/admitted and had 2 stents placed on 5/14 in this ED for MI.

## 2020-04-17 NOTE — Progress Notes (Signed)
Pt admitted into rm 255. VSS, no complaints of pain but pt stated "I feel like my heart is fluttering". Call bell is within reach.

## 2020-04-17 NOTE — ED Provider Notes (Signed)
[facility] Emergency Department Provider Note   ____________________________________________   First MD Initiated Contact with Patient 04/17/20 [phone]     (approximate)  I have reviewed the triage vital signs and the nursing notes.   HISTORY  Chief Complaint Chest Pain    HPI Melissa Lamb is a 59 y.o. female who presents to the ED from home with a chief complaint of chest pain and shortness of breath.  Patient had STEMI on 04/01/2020 with 2 stents placed.  Has been doing well since with recent adjustments in blood pressure medications by her cardiologist [name].  After walking last evening, patient began to experience palpitations, dyspnea on exertion and central chest discomfort.  Denies radiation, diaphoresis, nausea/vomiting or dizziness. Denies fever, cough, abdominal pain, dysuria, diarrhea.  Denies COVID-19 exposure.       Past Medical History:  Diagnosis Date  . Diabetes mellitus without complication ([place])   . History of chickenpox   . Hyperlipidemia   . Hypertension   . Thyroid cancer [facility])     Patient Active Problem List   Diagnosis Date Noted  . History of ST elevation myocardial infarction (STEMI) 04/01/2020  . Status post percutaneous transluminal angioplasty (PTA) with stent placement 04/01/2020  . S/P angioplasty with stent 04/01/2020  . Itchy eyes 02/22/2020  . Diabetes mellitus without complication ([place]) [id number]  . Essential hypertension 10/20/2019  . Type 2 diabetes mellitus with hyperlipidemia ([place]) 10/20/2019  . Post-surgical hypothyroidism 10/20/2019  . Abnormal peripheral vision of left eye 12/02/2018  . NAION (non-arteritic anterior ischemic optic neuropathy), left eye 12/02/2018  . Tear of medial meniscus of knee 11/16/2018    Past Surgical History:  Procedure Laterality Date  . ABDOMINAL HYSTERECTOMY  2014   has both ovaries still.  . APPENDECTOMY  2019  . BREAST BIOPSY Right    benign-seed was put in  to mark it  . BREAST REDUCTION SURGERY    . CHOLECYSTECTOMY  2019  . CORONARY/GRAFT ACUTE MI REVASCULARIZATION N/A 04/01/2020   Procedure: Coronary/Graft Acute MI Revascularization;  Surgeon: Yolonda Kida, MD;  Location: [facility];  Service: Cardiovascular;  Laterality: N/A;  . LEFT HEART CATH AND CORONARY ANGIOGRAPHY N/A 04/01/2020   Procedure: LEFT HEART CATH AND CORONARY ANGIOGRAPHY;  Surgeon: Yolonda Kida, MD;  Location: [facility];  Service: Cardiovascular;  Laterality: N/A;  . REDUCTION MAMMAPLASTY    . THYROIDECTOMY  2014   papillary well-differentiated lymph nodes.  . TONSILLECTOMY AND ADENOIDECTOMY      Prior to Admission medications   Medication Sig Start Date End Date Taking? Authorizing Provider  aspirin 81 MG chewable tablet Chew 1 tablet (81 mg total) by mouth Melissa. Need to take for at least a year. 04/04/20 04/04/21  Enzo Bi, MD  Cholecalciferol (VITAMIN D-1000 MAX ST) 25 MCG (1000 UT) tablet Take 2,000 Units by mouth Melissa.     [provider]  fenofibrate 160 MG tablet Take 1 tablet (160 mg total) by mouth Melissa. 10/20/19   [name], MD  glipiZIDE (GLUCOTROL) 5 MG tablet Take 1.5 tablets (7.5 mg total) by mouth Melissa. 04/06/20   [name], MD  levothyroxine (SYNTHROID) 88 MCG tablet Take 88 mcg by mouth Melissa.     [provider]  metFORMIN (GLUCOPHAGE) 500 MG tablet Take 1 tablet (500 mg total) by mouth 2 (two) times Melissa. 04/06/20   [name], MD  metoprolol succinate (TOPROL-XL) 25 MG 24 hr tablet Take 1  tablet (25 mg total) by mouth Melissa. 10/20/19   [name], MD  rosuvastatin (CRESTOR) 40 MG tablet Take 1 tablet (40 mg total) by mouth Melissa. 04/04/20 07/03/20  Enzo Bi, MD  ticagrelor (BRILINTA) 90 MG TABS tablet Take 1 tablet (90 mg total) by mouth 2 (two) times Melissa. Need to take this for at least a year. 04/03/20 04/03/21  Enzo Bi, MD    Allergies Lisinopril  Family History  Problem  Relation Age of Onset  . Diabetes Mother   . Hyperlipidemia Mother   . Hypertension Mother   . Hyperlipidemia Father   . Heart disease Father   . Heart attack Father 106       had defibrillator put in  . Diabetes Sister   . Hypertension Sister   . Diabetes Brother   . Hypertension Brother   . Cervical cancer Maternal Aunt   . Uterine cancer Maternal Grandmother   . Breast cancer Maternal Grandmother   . Diabetes Sister   . Hypertension Sister   . Breast cancer Maternal Aunt 58    Social History Social History   Tobacco Use  . Smoking status: Never Smoker  . Smokeless tobacco: Never Used  Substance Use Topics  . Alcohol use: Yes    Comment: rare  . Drug use: Never    Review of Systems  Constitutional: No fever/chills Eyes: No visual changes. ENT: No sore throat. Cardiovascular: Positive for chest pain. Respiratory: Positive for shortness of breath. Gastrointestinal: No abdominal pain.  No nausea, no vomiting.  No diarrhea.  No constipation. Genitourinary: Negative for dysuria. Musculoskeletal: Negative for back pain. Skin: Negative for rash. Neurological: Negative for headaches, focal weakness or numbness.   ____________________________________________   PHYSICAL EXAM:  VITAL SIGNS: ED Triage Vitals  Enc Vitals Group     BP 04/17/20 0026 (!) 178/80     Pulse Rate 04/17/20 0026 75     Resp 04/17/20 0026 20     Temp --      Temp src --      SpO2 04/17/20 0026 97 %     Weight 04/17/20 0027 143 lb 15.4 oz (65.3 kg)     Height 04/17/20 0027 5\' 8"  (1.727 m)     Head Circumference --      Peak Flow --      Pain Score 04/17/20 0026 6     Pain Loc --      Pain Edu? --      Excl. in [place]? --     Constitutional: Alert and oriented. Well appearing and in no acute distress. Eyes: Conjunctivae are normal. PERRL. EOMI. Head: Atraumatic. Nose: No congestion/rhinnorhea. Mouth/Throat: Mucous membranes are moist.   Neck: No stridor.   Cardiovascular: Normal rate,  regular rhythm. Grossly normal heart sounds.  Good peripheral circulation. Respiratory: Normal respiratory effort.  No retractions. Lungs CTAB. Gastrointestinal: Soft and nontender. No distention. No abdominal bruits. No CVA tenderness. Musculoskeletal: No lower extremity tenderness nor edema.  No joint effusions. Neurologic:  Normal speech and language. No gross focal neurologic deficits are appreciated. No gait instability. Skin:  Skin is warm, dry and intact. No rash noted. Psychiatric: Mood and affect are normal. Speech and behavior are normal.  ____________________________________________   LABS (all labs ordered are listed, but only abnormal results are displayed)  Labs Reviewed  BASIC METABOLIC PANEL - Abnormal; Notable for the following components:      Result Value   Glucose, Bld 176 (*)    All  other components within normal limits  TROPONIN I (HIGH SENSITIVITY) - Abnormal; Notable for the following components:   Troponin I (High Sensitivity) 33 (*)    All other components within normal limits  SARS CORONAVIRUS 2 BY RT PCR (HOSPITAL ORDER, [facility])  CBC  POC URINE PREG, ED  TROPONIN I (HIGH SENSITIVITY)   ____________________________________________  EKG  ED ECG REPORT I, SUNG,JADE J, the attending physician, personally viewed and interpreted this ECG.   Date: 04/17/2020  EKG Time: 0103  Rate: 73  Rhythm: normal EKG, normal sinus rhythm  Axis: Normal  Intervals:none  ST&T Change: T wave inversion inferiorly  ____________________________________________  RADIOLOGY  ED MD interpretation: No acute cardiopulmonary process  Official radiology report(s): DG Chest 2 View  Result Date: 04/17/2020 CLINICAL DATA:  Chest pain and shortness of breath. EXAM: CHEST - 2 VIEW COMPARISON:  04/01/2020 FINDINGS: The cardiomediastinal contours are normal. Coronary stents are visualized. The lungs are clear. Pulmonary vasculature is normal.  Calcified granuloma in the right upper lobe. No consolidation, pleural effusion, or pneumothorax. No acute osseous abnormalities are seen. IMPRESSION: No acute chest findings. Electronically Signed   By: Keith Rake M.D.   On: 04/17/2020 01:22    ____________________________________________   PROCEDURES  Procedure(s) performed (including Critical Care):  .1-3 Lead EKG Interpretation Performed by: Paulette Blanch, MD Authorized by: Paulette Blanch, MD     Interpretation: normal     ECG rate:  70   ECG rate assessment: normal     Rhythm: sinus rhythm     Ectopy: none     Conduction: normal   Comments:     Patient placed on cardiac monitor to monitor for arrhythmias     ____________________________________________   INITIAL IMPRESSION / ASSESSMENT AND PLAN / ED COURSE  As part of my medical decision making, I reviewed the following data within the [facility] notes reviewed and incorporated, Labs reviewed, EKG interpreted, Old chart reviewed, Radiograph reviewed, Discussed with admitting physician and Notes from prior ED visits     Melissa Lamb was evaluated in Emergency Department on 04/17/2020 for the symptoms described in the history of present illness. She was evaluated in the context of the global COVID-19 pandemic, which necessitated consideration that the patient might be at risk for infection with the SARS-CoV-2 virus that causes COVID-19. Institutional protocols and algorithms that pertain to the evaluation of patients at risk for COVID-19 are in a state of rapid change based on information released by regulatory bodies including the CDC and federal and state organizations. These policies and algorithms were followed during the patient's care in the ED.    59 year old female presenting with chest pain and dyspnea on exertion approximately 2 weeks after STEMI with 2 stents placed. Differential diagnosis includes, but is not limited to, ACS, aortic  dissection, pulmonary embolism, cardiac tamponade, pneumothorax, pneumonia, pericarditis, myocarditis, GI-related causes including esophagitis/gastritis, and musculoskeletal chest wall pain.    Laboratory results notable for troponin 33.  Unclear significance as troponin was >27,000 2 weeks ago.  Symptoms concerning for unstable angina.  Will administer aspirin.  Will discuss case with hospitalist services for admission.      ____________________________________________   FINAL CLINICAL IMPRESSION(S) / ED DIAGNOSES  Final diagnoses:  Unstable angina [facility])     ED Discharge Orders    None       Note:  This document was prepared using Dragon voice recognition software and may include unintentional dictation  errors.   Paulette Blanch, MD 04/17/20 [phone]

## 2020-04-18 DIAGNOSIS — I493 Ventricular premature depolarization: Secondary | ICD-10-CM | POA: Diagnosis not present

## 2020-04-18 DIAGNOSIS — I252 Old myocardial infarction: Secondary | ICD-10-CM

## 2020-04-18 DIAGNOSIS — I25110 Atherosclerotic heart disease of native coronary artery with unstable angina pectoris: Secondary | ICD-10-CM | POA: Diagnosis not present

## 2020-04-18 DIAGNOSIS — I10 Essential (primary) hypertension: Secondary | ICD-10-CM | POA: Diagnosis not present

## 2020-04-18 DIAGNOSIS — I200 Unstable angina: Secondary | ICD-10-CM | POA: Diagnosis not present

## 2020-04-18 LAB — CBC
HCT: 30.4 % — ABNORMAL LOW (ref 36.0–46.0)
Hemoglobin: 10.1 g/dL — ABNORMAL LOW (ref 12.0–15.0)
MCH: 31.3 pg (ref 26.0–34.0)
MCHC: 33.2 g/dL (ref 30.0–36.0)
MCV: 94.1 fL (ref 80.0–100.0)
Platelets: 253 10*3/uL (ref 150–400)
RBC: 3.23 MIL/uL — ABNORMAL LOW (ref 3.87–5.11)
RDW: 13.4 % (ref 11.5–15.5)
WBC: 4.2 10*3/uL (ref 4.0–10.5)
nRBC: 0 % (ref 0.0–0.2)

## 2020-04-18 LAB — BASIC METABOLIC PANEL
Anion gap: 6 (ref 5–15)
BUN: 12 mg/dL (ref 6–20)
CO2: 26 mmol/L (ref 22–32)
Calcium: 8.2 mg/dL — ABNORMAL LOW (ref 8.9–10.3)
Chloride: 112 mmol/L — ABNORMAL HIGH (ref 98–111)
Creatinine, Ser: 0.68 mg/dL (ref 0.44–1.00)
GFR calc Af Amer: >60 mL/min (ref >60)
GFR calc non Af Amer: >60 mL/min (ref >60)
Glucose, Bld: 129 mg/dL — ABNORMAL HIGH (ref 70–99)
Potassium: 4 mmol/L (ref 3.5–5.1)
Sodium: 144 mmol/L (ref 135–145)

## 2020-04-18 NOTE — Progress Notes (Signed)
Rema Fendt to be D/C'd Home per MD order.  Discussed prescriptions and follow up appointments with the patient. Prescriptions given to patient, medication list explained in detail. Pt verbalized understanding.  Allergies as of 04/18/2020      Reactions   Lisinopril Tinitus, Cough      Medication List    TAKE these medications   aspirin 81 MG chewable tablet Chew 1 tablet (81 mg total) by mouth daily. Need to take for at least a year.   fenofibrate 160 MG tablet Take 1 tablet (160 mg total) by mouth daily.   glipiZIDE 5 MG tablet Commonly known as: GLUCOTROL Take 1.5 tablets (7.5 mg total) by mouth daily.   metFORMIN 500 MG tablet Commonly known as: GLUCOPHAGE Take 1 tablet (500 mg total) by mouth 2 (two) times daily.   metoprolol succinate 50 MG 24 hr tablet Commonly known as: TOPROL-XL Take 1 tablet (50 mg total) by mouth daily. Take with or immediately following a meal. What changed:   medication strength  how much to take  additional instructions   rosuvastatin 40 MG tablet Commonly known as: CRESTOR Take 1 tablet (40 mg total) by mouth daily.   Synthroid 88 MCG tablet Generic drug: levothyroxine Take 88 mcg by mouth daily.   ticagrelor 90 MG Tabs tablet Commonly known as: BRILINTA Take 1 tablet (90 mg total) by mouth 2 (two) times daily. Need to take this for at least a year.   Vitamin D-1000 Max St 25 MCG (1000 UT) tablet Generic drug: Cholecalciferol Take 2,000 Units by mouth daily.       Vitals:   04/18/20 0747 04/18/20 0940  BP: 122/67   Pulse: (!) 57 65  Resp: 15   Temp: 98.1 F (36.7 C)   SpO2: 100%     Tele box removed and returned. Skin clean, dry and intact without evidence of skin break down, no evidence of skin tears noted. IV catheter discontinued intact. Site without signs and symptoms of complications. Dressing and pressure applied. Pt denies pain at this time. No complaints noted.  An After Visit Summary was printed and given  to the patient. Patient escorted via [place], and D/C home via private auto.  Rolley Sims

## 2020-04-18 NOTE — Discharge Instructions (Signed)
Palpitations Palpitations are feelings that your heartbeat is not normal. Your heartbeat may feel like it is:  Uneven.  Faster than normal.  Fluttering.  Skipping a beat. This is usually not a serious problem. In some cases, you may need tests to rule out any serious problems. Follow these instructions at home: Pay attention to any changes in your condition. Take these actions to help manage your symptoms: Eating and drinking  Avoid: ? Coffee, tea, soft drinks, and energy drinks. ? Chocolate. ? Alcohol. ? Diet pills. Lifestyle   Try to lower your stress. These things can help you relax: ? Yoga. ? Deep breathing and meditation. ? Exercise. ? Using words and images to create positive thoughts (guided imagery). ? Using your mind to control things in your body (biofeedback).  Do not use drugs.  Get plenty of rest and sleep. Keep a regular bed time. General instructions   Take over-the-counter and prescription medicines only as told by your doctor.  Do not use any products that contain nicotine or tobacco, such as cigarettes and e-cigarettes. If you need help quitting, ask your doctor.  Keep all follow-up visits as told by your doctor. This is important. You may need more tests if palpitations do not go away or get worse. Contact a doctor if:  Your symptoms last more than 24 hours.  Your symptoms occur more often. Get help right away if you:  Have chest pain.  Feel short of breath.  Have a very bad headache.  Feel dizzy.  Pass out (faint). Summary  Palpitations are feelings that your heartbeat is uneven or faster than normal. It may feel like your heart is fluttering or skipping a beat.  Avoid food and drinks that may cause palpitations. These include caffeine, chocolate, and alcohol.  Try to lower your stress. Do not smoke or use drugs.  Get help right away if you faint or have chest pain, shortness of breath, a severe headache, or dizziness. This  information is not intended to replace advice given to you by your health care provider. Make sure you discuss any questions you have with your health care provider. Document Revised: 12/18/2017 Document Reviewed: 12/18/2017 Elsevier Patient Education  2020 [employer].   Premature Ventricular Contraction  A premature ventricular contraction (PVC) is a common kind of irregular heartbeat (arrhythmia). These contractions are extra heartbeats that start in the ventricles of the heart and occur too early in the normal sequence. During the PVC, the heart's normal electrical pathway is not used, so the beat is shorter and less effective. In most cases, these contractions come and go and do not require treatment. What are the causes? Common causes of the condition include:  Smoking.  Drinking alcohol.  Certain medicines.  Some illegal drugs.  Stress.  Caffeine. Certain medical conditions can also cause PVCs:  Heart failure.  Heart attack, or coronary artery disease.  Heart valve problems.  Changes in minerals in the blood (electrolytes).  Low blood oxygen levels or high carbon dioxide levels. In many cases, the cause of this condition is not known. What are the signs or symptoms? The main symptom of this condition is fast or skipped heartbeats (palpitations). Other symptoms include:  Chest pain.  Shortness of breath.  Feeling tired.  Dizziness.  Difficulty exercising. In some cases, there are no symptoms. How is this diagnosed? This condition may be diagnosed based on:  Your medical history.  A physical exam. During the exam, the health care provider will check for  irregular heartbeats.  Tests, such as: ? An ECG (electrocardiogram) to monitor the electrical activity of your heart. ? An ambulatory cardiac monitor. This device records your heartbeats for 24 hours or more. ? Stress tests to see how exercise affects your heart rhythm and blood supply. ? An  echocardiogram. This test uses sound waves (ultrasound) to produce an image of your heart. ? An electrophysiology study (EPS). This test checks for electrical problems in your heart. How is this treated? Treatment for this condition depends on any underlying conditions, the type of PVCs that you are having, and how much the symptoms are interfering with your daily life. Possible treatments include:  Avoiding things that cause premature contractions (triggers). These include caffeine and alcohol.  Taking medicines if symptoms are severe or if the extra heartbeats are frequent.  Getting treatment for underlying conditions that cause PVCs.  Having an implantable cardioverter defibrillator (ICD), if you are at risk for a serious arrhythmia. The ICD is a small device that is inserted into your chest to monitor your heartbeat. When it senses an irregular heartbeat, it sends a shock to bring the heartbeat back to normal.  Having a procedure to destroy the portion of the heart tissue that sends out abnormal signals (catheter ablation). In some cases, no treatment is required. Follow these instructions at home: Lifestyle  Do not use any products that contain nicotine or tobacco, such as cigarettes, e-cigarettes, and chewing tobacco. If you need help quitting, ask your health care provider.  Do not use illegal drugs.  Exercise regularly. Ask your health care provider what type of exercise is safe for you.  Try to get at least 7-9 hours of sleep each night, or as much as recommended by your health care provider.  Find healthy ways to manage stress. Avoid stressful situations when possible. Alcohol use  Do not drink alcohol if: ? Your health care provider tells you not to drink. ? You are pregnant, may be pregnant, or are planning to become pregnant. ? Alcohol triggers your episodes.  If you drink alcohol: ? Limit how much you use to:  0-1 drink a day for women.  0-2 drinks a day for  men.  Be aware of how much alcohol is in your drink. In the [place] one drink equals one 12 oz bottle of beer (355 mL), one 5 oz glass of wine (148 mL), or one 1 oz glass of hard liquor (44 mL). General instructions  Take over-the-counter and prescription medicines only as told by your health care provider.  If caffeine triggers episodes of PVC, do not eat, drink, or use anything with caffeine in it.  Keep all follow-up visits as told by your health care provider. This is important. Contact a health care provider if you:  Feel palpitations. Get help right away if you:  Have chest pain.  Have shortness of breath.  Have sweating for no reason.  Have nausea and vomiting.  Become light-headed or you faint. Summary  A premature ventricular contraction (PVC) is a common kind of irregular heartbeat (arrhythmia).  In most cases, these contractions come and go and do not require treatment.  You may need to wear an ambulatory cardiac monitor. This records your heartbeats for 24 hours or more.  Treatment depends on any underlying conditions, the type of PVCs that you are having, and how much the symptoms are interfering with your daily life. This information is not intended to replace advice given to you by your health care  provider. Make sure you discuss any questions you have with your health care provider. Document Revised: 07/31/2018 Document Reviewed: 07/31/2018 Elsevier Patient Education  2020 [employer].

## 2020-04-18 NOTE — Plan of Care (Signed)
  Problem: Education: Goal: Knowledge of General Education information will improve Description: Including pain rating scale, medication(s)/side effects and non-pharmacologic comfort measures Outcome: Progressing   Problem: Health Behavior/Discharge Planning: Goal: Ability to manage health-related needs will improve Outcome: Progressing   Problem: Clinical Measurements: Goal: Ability to maintain clinical measurements within normal limits will improve Outcome: Progressing Goal: Will remain free from infection Outcome: Progressing   Problem: Nutrition: Goal: Adequate nutrition will be maintained Outcome: Progressing   Problem: Elimination: Goal: Will not experience complications related to bowel motility Outcome: Progressing Goal: Will not experience complications related to urinary retention Outcome: Progressing   Problem: Pain Managment: Goal: General experience of comfort will improve Outcome: Progressing   Problem: Safety: Goal: Ability to remain free from injury will improve Outcome: Progressing   Problem: Skin Integrity: Goal: Risk for impaired skin integrity will decrease Outcome: Progressing

## 2020-04-18 NOTE — Discharge Summary (Signed)
Physician Discharge Summary  Melissa Lamb SWF:[id number] DOB: [birth year] DOA: 04/17/2020  PCP: [name], MD  Admit date: 04/17/2020 Discharge date: 04/18/2020  Admitted From: Home Disposition: Home  Recommendations for Outpatient Follow-up:  1. Follow up with [name] in 2 weeks.  Home health: None Equipment/Devices: None  Discharge Condition: Fair CODE STATUS: Full code Diet recommendation: Heart Healthy / Carb Modified    Discharge Diagnoses:  Principal Problem:   PVC's (premature ventricular contractions)  Active Problems:   Essential hypertension   Type 2 diabetes mellitus with hyperlipidemia (HCC)   History of ST elevation myocardial infarction (STEMI)  Brief narrative/HPI 59 year old female with history of type 2 diabetes mellitus, hypertension with recent NSTEMI 2 weeks back with PCI and stent placement x2 (currently on dual antiplatelet therapy and high-dose statin) presented with palpitations and shortness of breath.  Troponin peaked at 33.  EKG showed PACs.  Patient placed in observation for further management and cardiology consulted.  Hospital course  Principal problem  Premature ventricular contractions (HCC) Heart rate better controlled with metoprolol dose increased to 50 mg daily.  Remained stable on telemetry and denies further symptoms. Patient prescribed for Toprol-XL 50 mg daily (was on 25 mg daily).  Reports she cannot get a new refill from her insurance and plans to take two 25 mg tablet daily. Seen by cardiology and okay for discharge home with outpatient follow-up.  Active problems Recent NSTEMI S/p PCI and stent placement x2.  Continue aspirin, Brilinta statin and beta-blocker. Follow-up with Dr. [name] in 2 weeks.  Diabetes mellitus type 2, controlled Continue Metformin and glipizide.   Hypothyroidism Stable.  Continue Synthroid  Disposition: Home Procedure: None Consult: Cardiology ([name])    Discharge  Instructions  Discharge Instructions    Activity as tolerated - No restrictions   Complete by: As directed    Call MD for:  difficulty breathing, headache or visual disturbances   Complete by: As directed    Call MD for:  extreme fatigue   Complete by: As directed    Call MD for:  persistant dizziness or light-headedness   Complete by: As directed    Call MD for:  severe uncontrolled pain   Complete by: As directed    Diet - low sodium heart healthy   Complete by: As directed    Diet Carb Modified   Complete by: As directed    Discharge instructions   Complete by: As directed    Discharge to home Follow up with PCP in one week Follow up with [facility] in one week   Increase activity slowly   Complete by: As directed      Allergies as of 04/18/2020      Reactions   Lisinopril Tinitus, Cough      Medication List    TAKE these medications   aspirin 81 MG chewable tablet Chew 1 tablet (81 mg total) by mouth daily. Need to take for at least a year.   fenofibrate 160 MG tablet Take 1 tablet (160 mg total) by mouth daily.   glipiZIDE 5 MG tablet Commonly known as: GLUCOTROL Take 1.5 tablets (7.5 mg total) by mouth daily.   metFORMIN 500 MG tablet Commonly known as: GLUCOPHAGE Take 1 tablet (500 mg total) by mouth 2 (two) times daily.   metoprolol succinate 50 MG 24 hr tablet Commonly known as: TOPROL-XL Take 1 tablet (50 mg total) by mouth daily. Take with or immediately following a meal. What changed:   medication strength  how much to take  additional instructions   rosuvastatin 40 MG tablet Commonly known as: CRESTOR Take 1 tablet (40 mg total) by mouth daily.   Synthroid 88 MCG tablet Generic drug: levothyroxine Take 88 mcg by mouth daily.   ticagrelor 90 MG Tabs tablet Commonly known as: BRILINTA Take 1 tablet (90 mg total) by mouth 2 (two) times daily. Need to take this for at least a year.   Vitamin Lamb-1000 Max St 25 MCG (1000 UT)  tablet Generic drug: Cholecalciferol Take 2,000 Units by mouth daily.      Follow-up Information    Melissa Amel D, MD. Schedule an appointment as soon as possible for a visit in 2 week(s).   Specialties: Cardiology, Internal Medicine Contact information: [address] [place] [address] [phone]          Allergies  Allergen Reactions  . Lisinopril Tinitus and Cough        Procedures/Studies: DG Chest 2 View  Result Date: 04/17/2020 CLINICAL DATA:  Chest pain and shortness of breath. EXAM: CHEST - 2 VIEW COMPARISON:  04/01/2020 FINDINGS: The cardiomediastinal contours are normal. Coronary stents are visualized. The lungs are clear. Pulmonary vasculature is normal. Calcified granuloma in the right upper lobe. No consolidation, pleural effusion, or pneumothorax. No acute osseous abnormalities are seen. IMPRESSION: No acute chest findings. Electronically Signed   By: Keith Rake M.Lamb.   On: 04/17/2020 01:22   CARDIAC CATHETERIZATION  Result Date: 04/04/2020  Prox Cx to Mid Cx lesion is 100% stenosed.  Prox LAD to Mid LAD lesion is 75% stenosed.  A drug-eluting stent was successfully placed using a STENT RESOLUTE ONYX 2.5X30.  Post intervention, there is a 0% residual stenosis.  A drug-eluting stent was successfully placed using a STENT RESOLUTE ONYX 3.5X15.  Post intervention, there is a 0% residual stenosis.  Conclusion STEMI presentation posterior MI Diagnostic cath which shows occlusion of mid AV groove circumflex RRA 100% Normal left main 75% mid LAD lesion Normal right coronary artery Preserved overall left ventricular function with mild inferior lateral hypokinesis Intervention Successful PCI and stent of mid AV groove circumflex with DES 2.5 x 30 mm resolute Onyx to 13 atm From TIMI 0 flow to TIMI-3 flow Successful PCI and stent of mid LAD with DES 3.5 x 15 mm resolute Onyx to 13 atm Minx deployed Aggrastat for 12 to 18 hours post procedure   DG  Chest Port 1 View  Result Date: 04/01/2020 CLINICAL DATA:  Shortness of breath over month and a half since COVID shots. EXAM: PORTABLE CHEST 1 VIEW COMPARISON:  None FINDINGS: Cardiomediastinal contours and hilar structures are unremarkable. Lungs are clear. Visualized skeletal structures are unremarkable. Pacer defibrillator pad overlies the LEFT chest. IMPRESSION: No acute cardiopulmonary disease. Electronically Signed   By: Zetta Bills M.Lamb.   On: 04/01/2020 18:48   ECHOCARDIOGRAM COMPLETE  Result Date: 04/03/2020    ECHOCARDIOGRAM REPORT   Patient Name:   Melissa Lamb Date of Exam: 04/03/2020 Medical Rec #:  [id number]         Height:       68.0 in Accession #:    [id number]        Weight:       143.9 lb Date of Birth:  08-27-1961         BSA:          1.777 m Patient Age:    86 years          BP:  103/63 mmHg Patient Gender: F                 HR:           89 bpm. Exam Location:  [facility] Procedure: 2D Echo Indications:     NSTEMI I21.4  History:         Patient has no prior history of Echocardiogram examinations.  Sonographer:     Arville Go RDCS Referring Phys:  [id number] [place] Diagnosing Phys: Yolonda Kida MD  Sonographer Comments: Suboptimal parasternal window and Technically difficult study due to poor echo windows. IMPRESSIONS  1. Left ventricular ejection fraction, by estimation, is 55 to 60%. The left ventricle has normal function. The left ventricle demonstrates regional wall motion abnormalities (see scoring diagram/findings for description). Left ventricular diastolic parameters are consistent with Grade I diastolic dysfunction (impaired relaxation).  2. Right ventricular systolic function is normal. The right ventricular size is normal.  3. The mitral valve is grossly normal. Trivial mitral valve regurgitation.  4. The aortic valve is grossly normal. Aortic valve regurgitation is trivial. FINDINGS  Left Ventricle: Left ventricular ejection fraction, by estimation, is 55 to  60%. The left ventricle has normal function. The left ventricle demonstrates regional wall motion abnormalities. The left ventricular internal cavity size was normal in size. There is no left ventricular hypertrophy. Left ventricular diastolic parameters are consistent with Grade I diastolic dysfunction (impaired relaxation).  LV Wall Scoring: The anterior wall and mid inferolateral segment are hypokinetic. Right Ventricle: The right ventricular size is normal. No increase in right ventricular wall thickness. Right ventricular systolic function is normal. Left Atrium: Left atrial size was normal in size. Right Atrium: Right atrial size was normal in size. Pericardium: The pericardium was not assessed. Mitral Valve: The mitral valve is grossly normal. Trivial mitral valve regurgitation. Tricuspid Valve: The tricuspid valve is normal in structure. Tricuspid valve regurgitation is not demonstrated. Aortic Valve: The aortic valve is grossly normal. Aortic valve regurgitation is trivial. Aortic valve peak gradient measures 6.8 mmHg. Pulmonic Valve: The pulmonic valve was grossly normal. Pulmonic valve regurgitation is not visualized. Aorta: The aortic arch was not well visualized. IAS/Shunts: No atrial level shunt detected by color flow Doppler.  LEFT VENTRICLE PLAX 2D LVIDd:         3.89 cm  Diastology LVIDs:         2.50 cm  LV e' lateral:   3.81 cm/s LV PW:         1.42 cm  LV E/e' lateral: 17.8 LV IVS:        1.01 cm  LV e' medial:    5.11 cm/s LVOT diam:     1.80 cm  LV E/e' medial:  13.2 LV SV:         46 LV SV Index:   26 LVOT Area:     2.54 cm  RIGHT VENTRICLE RV Basal diam:  2.18 cm RV S prime:     14.60 cm/s TAPSE (M-mode): 1.9 cm LEFT ATRIUM             Index       RIGHT ATRIUM          Index LA diam:        3.90 cm 2.19 cm/m  RA Area:     7.74 cm LA Vol (A2C):   41.7 ml 23.47 ml/m RA Volume:   13.60 ml 7.65 ml/m LA Vol (A4C):   21.0 ml 11.82 ml/m LA Biplane Vol:  29.7 ml 16.71 ml/m  AORTIC VALVE AV Area  (Vmax): 1.78 cm AV Vmax:        130.00 cm/s AV Peak Grad:   6.8 mmHg LVOT Vmax:      90.70 cm/s LVOT Vmean:     59.400 cm/s LVOT VTI:       0.181 m  AORTA Ao Root diam: 2.90 cm MITRAL VALVE               TRICUSPID VALVE MV Area (PHT): 5.38 cm    TV Peak grad:   16.2 mmHg MV Decel Time: 141 msec    TV Vmax:        2.01 m/s MV E velocity: 67.70 cm/s MV A velocity: 87.00 cm/s  SHUNTS MV E/A ratio:  0.78        Systemic VTI:  0.18 m                            Systemic Diam: 1.80 cm Yolonda Kida MD Electronically signed by Yolonda Kida MD Signature Date/Time: 04/03/2020/4:07:54 PM    Final      Subjective: Seen and examined.  Denies further palpitations.  Heart rate stable on the monitor.  Discharge Exam: Vitals:   04/18/20 0450 04/18/20 0747  BP: 124/70 122/67  Pulse: (!) 57 (!) 57  Resp:  15  Temp: 98.2 F (36.8 C) 98.1 F (36.7 C)  SpO2: 99% 100%   Vitals:   04/17/20 1525 04/17/20 2006 04/18/20 0450 04/18/20 0747  BP: 108/68 110/63 124/70 122/67  Pulse: 66 60 (!) 57 (!) 57  Resp: 14   15  Temp: 98.9 F (37.2 C) 98 F (36.7 C) 98.2 F (36.8 C) 98.1 F (36.7 C)  TempSrc: Oral Oral Oral   SpO2: 97% 97% 99% 100%  Weight:   65.3 kg   Height:        General: Middle-aged female not in distress HEENT: Moist mucosa, supple neck Chest: Clear bilaterally CVs: S1-S2, no murmurs rubs or gallops GI: Soft, nondistended, nontender Musculoskeletal: Warm, no edema    The results of significant diagnostics from this hospitalization (including imaging, microbiology, ancillary and laboratory) are listed below for reference.     Microbiology: Recent Results (from the past 240 hour(s))  SARS Coronavirus 2 by RT PCR (hospital order, performed in [facility] hospital lab) Nasopharyngeal Nasopharyngeal Swab     Status: None   Collection Time: 04/17/20  4:38 AM   Specimen: Nasopharyngeal Swab  Result Value Ref Range Status   SARS Coronavirus 2 NEGATIVE NEGATIVE Final    Comment:  (NOTE) SARS-CoV-2 target nucleic acids are NOT DETECTED. The SARS-CoV-2 RNA is generally detectable in upper and lower respiratory specimens during the acute phase of infection. The lowest concentration of SARS-CoV-2 viral copies this assay can detect is 250 copies / mL. A negative result does not preclude SARS-CoV-2 infection and should not be used as the sole basis for treatment or other patient management decisions.  A negative result may occur with improper specimen collection / handling, submission of specimen other than nasopharyngeal swab, presence of viral mutation(s) within the areas targeted by this assay, and inadequate number of viral copies (<250 copies / mL). A negative result must be combined with clinical observations, patient history, and epidemiological information. Fact Sheet for Patients:   StrictlyIdeas.no Fact Sheet for Healthcare Providers: BankingDealers.co.za This test is not yet approved or cleared  by the [place] FDA and  has been authorized for detection and/or diagnosis of SARS-CoV-2 by FDA under an Emergency Use Authorization (EUA).  This EUA will remain in effect (meaning this test can be used) for the duration of the COVID-19 declaration under Section 564(b)(1) of the Act, 21 U.S.C. section 360bbb-3(b)(1), unless the authorization is terminated or revoked sooner. Performed at [facility], [address] [place], [place] [address]      Labs: BNP (last 3 results) No results for input(s): BNP in the last 8760 hours. Basic Metabolic Panel: Recent Labs  Lab 04/17/20 0044 04/18/20 0340  NA 140 144  K 4.0 4.0  CL 106 112*  CO2 25 26  GLUCOSE 176* 129*  BUN 13 12  CREATININE 0.99 0.68  CALCIUM 9.3 8.2*   Liver Function Tests: No results for input(s): AST, ALT, ALKPHOS, BILITOT, PROT, ALBUMIN in the last 168 hours. No results for input(s): LIPASE, AMYLASE in the last 168 hours. No  results for input(s): AMMONIA in the last 168 hours. CBC: Recent Labs  Lab 04/17/20 0044 04/18/20 0340  WBC 5.2 4.2  HGB 12.0 10.1*  HCT 37.1 30.4*  MCV 95.4 94.1  PLT 364 253   Cardiac Enzymes: No results for input(s): CKTOTAL, CKMB, CKMBINDEX, TROPONINI in the last 168 hours. BNP: Invalid input(s): POCBNP CBG: Recent Labs  Lab 04/15/20 0930 04/15/20 1029  GLUCAP 170* 112*   Lamb-Dimer No results for input(s): DDIMER in the last 72 hours. Hgb A1c No results for input(s): HGBA1C in the last 72 hours. Lipid Profile No results for input(s): CHOL, HDL, LDLCALC, TRIG, CHOLHDL, LDLDIRECT in the last 72 hours. Thyroid function studies No results for input(s): TSH, T4TOTAL, T3FREE, THYROIDAB in the last 72 hours.  Invalid input(s): FREET3 Anemia work up No results for input(s): VITAMINB12, FOLATE, FERRITIN, TIBC, IRON, RETICCTPCT in the last 72 hours. Urinalysis No results found for: COLORURINE, APPEARANCEUR, [place], [place], [place], [place], [place], [place], PROTEINUR, UROBILINOGEN, NITRITE, LEUKOCYTESUR Sepsis Labs Invalid input(s): PROCALCITONIN,  WBC,  LACTICIDVEN Microbiology Recent Results (from the past 240 hour(s))  SARS Coronavirus 2 by RT PCR (hospital order, performed in [facility] hospital lab) Nasopharyngeal Nasopharyngeal Swab     Status: None   Collection Time: 04/17/20  4:38 AM   Specimen: Nasopharyngeal Swab  Result Value Ref Range Status   SARS Coronavirus 2 NEGATIVE NEGATIVE Final    Comment: (NOTE) SARS-CoV-2 target nucleic acids are NOT DETECTED. The SARS-CoV-2 RNA is generally detectable in upper and lower respiratory specimens during the acute phase of infection. The lowest concentration of SARS-CoV-2 viral copies this assay can detect is 250 copies / mL. A negative result does not preclude SARS-CoV-2 infection and should not be used as the sole basis for treatment or other patient management decisions.  A negative result may occur  with improper specimen collection / handling, submission of specimen other than nasopharyngeal swab, presence of viral mutation(s) within the areas targeted by this assay, and inadequate number of viral copies (<250 copies / mL). A negative result must be combined with clinical observations, patient history, and epidemiological information. Fact Sheet for Patients:   StrictlyIdeas.no Fact Sheet for Healthcare Providers: BankingDealers.co.za This test is not yet approved or cleared  by the [place] FDA and has been authorized for detection and/or diagnosis of SARS-CoV-2 by FDA under an Emergency Use Authorization (EUA).  This EUA will remain in effect (meaning this test can be used) for the duration of the COVID-19 declaration under Section 564(b)(1) of the Act, 21 U.S.C. section 360bbb-3(b)(1),  unless the authorization is terminated or revoked sooner. Performed at [facility], [address], [place], [place] [address]      Time coordinating discharge: 25 minutes  SIGNED:   Louellen Molder, MD  [facility] 04/18/2020, 9:03 AM Pager   If 7PM-7AM, please contact night-coverage [email or web address] Password [email or web address]

## 2020-04-18 NOTE — Plan of Care (Signed)
  Problem: Pain Managment: Goal: General experience of comfort will improve Outcome: Progressing   

## 2020-04-18 NOTE — Progress Notes (Signed)
[facility] Encounter Note  Patient: Melissa Lamb / Admit Date: 04/17/2020 / Date of Encounter: 04/18/2020, 7:22 AM   Subjective: Patient feels well this morning with no current evidence of chest discomfort anginal equivalent and/or shortness of breath palpitations or congestive heart failure.  Telemetry early on did show ventricular couplets and PVCs which may have been her primary symptom with no evidence of myocardial infarction and troponin peaked at 33.  She has been on appropriate medication management status post myocardial infarction 2 weeks prior with continued improvements with physical activity.  Overnight patient was increase the dose of metoprolol which will help current symptoms  Review of Systems: Positive for: None Negative for: Vision change, hearing change, syncope, dizziness, nausea, vomiting,diarrhea, bloody stool, stomach pain, cough, congestion, diaphoresis, urinary frequency, urinary pain,skin lesions, skin rashes Others previously listed  Objective: Telemetry: Normal sinus rhythm Physical Exam: Blood pressure 124/70, pulse (!) 57, temperature 98.2 F (36.8 C), temperature source Oral, resp. rate 14, height 5' 8" (1.727 m), weight 65.3 kg, SpO2 99 %. Body mass index is 21.88 kg/m. General: Well developed, well nourished, in no acute distress. Head: Normocephalic, atraumatic, sclera non-icteric, no xanthomas, nares are without discharge. Neck: No apparent masses Lungs: Normal respirations with no wheezes, no rhonchi, no rales , no crackles   Heart: Regular rate and rhythm, normal S1 S2, no murmur, no rub, no gallop, PMI is normal size and placement, carotid upstroke normal without bruit, jugular venous pressure normal Abdomen: Soft, non-tender, non-distended with normoactive bowel sounds. No hepatosplenomegaly. Abdominal aorta is normal size without bruit Extremities: No edema, no clubbing, no cyanosis, no ulcers,  Peripheral: 2+ radial, 2+  femoral, 2+ dorsal pedal pulses Neuro: Alert and oriented. Moves all extremities spontaneously. Psych:  Responds to questions appropriately with a normal affect.   Intake/Output Summary (Last 24 hours) at 04/18/2020 6759 Last data filed at 04/18/2020 1638 Gross per 24 hour  Intake 2049.52 ml  Output 2400 ml  Net -350.48 ml    Inpatient Medications:  . aspirin  81 mg Oral Daily  . enoxaparin (LOVENOX) injection  40 mg Subcutaneous Q24H  . fenofibrate  160 mg Oral Daily  . glipiZIDE  7.5 mg Oral QAC breakfast  . levothyroxine  88 mcg Oral Daily  . metoprolol succinate  50 mg Oral Daily  . pneumococcal 23 valent vaccine  0.5 mL Intramuscular Tomorrow-1000  . rosuvastatin  40 mg Oral Daily  . ticagrelor  90 mg Oral BID   Infusions:  . sodium chloride 100 mL/hr at 04/18/20 0206    Labs: Recent Labs    04/17/20 0044 04/18/20 0340  NA 140 144  K 4.0 4.0  CL 106 112*  CO2 25 26  GLUCOSE 176* 129*  BUN 13 12  CREATININE 0.99 0.68  CALCIUM 9.3 8.2*   No results for input(s): AST, ALT, ALKPHOS, BILITOT, PROT, ALBUMIN in the last 72 hours. Recent Labs    04/17/20 0044 04/18/20 0340  WBC 5.2 4.2  HGB 12.0 10.1*  HCT 37.1 30.4*  MCV 95.4 94.1  PLT 364 253   No results for input(s): CKTOTAL, CKMB, TROPONINI in the last 72 hours. Invalid input(s): POCBNP No results for input(s): HGBA1C in the last 72 hours.   Weights: Filed Weights   04/17/20 0027 04/18/20 0450  Weight: 65.3 kg 65.3 kg     Radiology/Studies:  DG Chest 2 View  Result Date: 04/17/2020 CLINICAL DATA:  Chest pain and shortness of breath. EXAM: CHEST - 2 VIEW  COMPARISON:  04/01/2020 FINDINGS: The cardiomediastinal contours are normal. Coronary stents are visualized. The lungs are clear. Pulmonary vasculature is normal. Calcified granuloma in the right upper lobe. No consolidation, pleural effusion, or pneumothorax. No acute osseous abnormalities are seen. IMPRESSION: No acute chest findings. Electronically  Signed   By: Keith Rake M.D.   On: 04/17/2020 01:22   CARDIAC CATHETERIZATION  Result Date: 04/04/2020  Prox Cx to Mid Cx lesion is 100% stenosed.  Prox LAD to Mid LAD lesion is 75% stenosed.  A drug-eluting stent was successfully placed using a STENT RESOLUTE ONYX 2.5X30.  Post intervention, there is a 0% residual stenosis.  A drug-eluting stent was successfully placed using a STENT RESOLUTE ONYX 3.5X15.  Post intervention, there is a 0% residual stenosis.  Conclusion STEMI presentation posterior MI Diagnostic cath which shows occlusion of mid AV groove circumflex RRA 100% Normal left main 75% mid LAD lesion Normal right coronary artery Preserved overall left ventricular function with mild inferior lateral hypokinesis Intervention Successful PCI and stent of mid AV groove circumflex with DES 2.5 x 30 mm resolute Onyx to 13 atm From TIMI 0 flow to TIMI-3 flow Successful PCI and stent of mid LAD with DES 3.5 x 15 mm resolute Onyx to 13 atm Minx deployed Aggrastat for 12 to 18 hours post procedure   DG Chest Port 1 View  Result Date: 04/01/2020 CLINICAL DATA:  Shortness of breath over month and a half since COVID shots. EXAM: PORTABLE CHEST 1 VIEW COMPARISON:  None FINDINGS: Cardiomediastinal contours and hilar structures are unremarkable. Lungs are clear. Visualized skeletal structures are unremarkable. Pacer defibrillator pad overlies the LEFT chest. IMPRESSION: No acute cardiopulmonary disease. Electronically Signed   By: Zetta Bills M.D.   On: 04/01/2020 18:48   ECHOCARDIOGRAM COMPLETE  Result Date: 04/03/2020    ECHOCARDIOGRAM REPORT   Patient Name:   Melissa Lamb Date of Exam: 04/03/2020 Medical Rec #:  [id number]         Height:       68.0 in Accession #:    [id number]        Weight:       143.9 lb Date of Birth:  11/03/1961         BSA:          1.777 m Patient Age:    59 years          BP:           103/63 mmHg Patient Gender: F                 HR:           89 bpm. Exam  Location:  [facility] Procedure: 2D Echo Indications:     NSTEMI I21.4  History:         Patient has no prior history of Echocardiogram examinations.  Sonographer:     Arville Go RDCS Referring Phys:  [id number] [place] Diagnosing Phys: Yolonda Kida MD  Sonographer Comments: Suboptimal parasternal window and Technically difficult study due to poor echo windows. IMPRESSIONS  1. Left ventricular ejection fraction, by estimation, is 55 to 60%. The left ventricle has normal function. The left ventricle demonstrates regional wall motion abnormalities (see scoring diagram/findings for description). Left ventricular diastolic parameters are consistent with Grade I diastolic dysfunction (impaired relaxation).  2. Right ventricular systolic function is normal. The right ventricular size is normal.  3. The mitral valve is grossly normal. Trivial mitral valve regurgitation.  4. The  aortic valve is grossly normal. Aortic valve regurgitation is trivial. FINDINGS  Left Ventricle: Left ventricular ejection fraction, by estimation, is 55 to 60%. The left ventricle has normal function. The left ventricle demonstrates regional wall motion abnormalities. The left ventricular internal cavity size was normal in size. There is no left ventricular hypertrophy. Left ventricular diastolic parameters are consistent with Grade I diastolic dysfunction (impaired relaxation).  LV Wall Scoring: The anterior wall and mid inferolateral segment are hypokinetic. Right Ventricle: The right ventricular size is normal. No increase in right ventricular wall thickness. Right ventricular systolic function is normal. Left Atrium: Left atrial size was normal in size. Right Atrium: Right atrial size was normal in size. Pericardium: The pericardium was not assessed. Mitral Valve: The mitral valve is grossly normal. Trivial mitral valve regurgitation. Tricuspid Valve: The tricuspid valve is normal in structure. Tricuspid valve regurgitation is not  demonstrated. Aortic Valve: The aortic valve is grossly normal. Aortic valve regurgitation is trivial. Aortic valve peak gradient measures 6.8 mmHg. Pulmonic Valve: The pulmonic valve was grossly normal. Pulmonic valve regurgitation is not visualized. Aorta: The aortic arch was not well visualized. IAS/Shunts: No atrial level shunt detected by color flow Doppler.  LEFT VENTRICLE PLAX 2D LVIDd:         3.89 cm  Diastology LVIDs:         2.50 cm  LV e' lateral:   3.81 cm/s LV PW:         1.42 cm  LV E/e' lateral: 17.8 LV IVS:        1.01 cm  LV e' medial:    5.11 cm/s LVOT diam:     1.80 cm  LV E/e' medial:  13.2 LV SV:         46 LV SV Index:   26 LVOT Area:     2.54 cm  RIGHT VENTRICLE RV Basal diam:  2.18 cm RV S prime:     14.60 cm/s TAPSE (M-mode): 1.9 cm LEFT ATRIUM             Index       RIGHT ATRIUM          Index LA diam:        3.90 cm 2.19 cm/m  RA Area:     7.74 cm LA Vol (A2C):   41.7 ml 23.47 ml/m RA Volume:   13.60 ml 7.65 ml/m LA Vol (A4C):   21.0 ml 11.82 ml/m LA Biplane Vol: 29.7 ml 16.71 ml/m  AORTIC VALVE AV Area (Vmax): 1.78 cm AV Vmax:        130.00 cm/s AV Peak Grad:   6.8 mmHg LVOT Vmax:      90.70 cm/s LVOT Vmean:     59.400 cm/s LVOT VTI:       0.181 m  AORTA Ao Root diam: 2.90 cm MITRAL VALVE               TRICUSPID VALVE MV Area (PHT): 5.38 cm    TV Peak grad:   16.2 mmHg MV Decel Time: 141 msec    TV Vmax:        2.01 m/s MV E velocity: 67.70 cm/s MV A velocity: 87.00 cm/s  SHUNTS MV E/A ratio:  0.78        Systemic VTI:  0.18 m                            Systemic Diam: 1.80 cm   Dwayne D Callwood MD Electronically signed by Dwayne D Callwood MD Signature Date/Time: 04/03/2020/4:07:54 PM    Final      Assessment and Recommendation  58 y.o. female with known coronary artery disease status post non-ST elevation myocardial infarction several weeks prior with PCI and stent placement diabetes hypertension hyperlipidemia and preventricular contractions with no evidence of current  myocardial infarction or congestive heart failure improved with appeared to be benign PVCs now improved with beta-blocker 1.  No further cardiac intervention and or diagnostics necessary at this time 2.  High intensity cholesterol therapy 3.  Dual antiplatelet therapy 4.  Continue metoprolol at slightly increased dose of 50 mg for preventricular contractions and myocardial infarction 5.  If ambulating well today okay for discharge home from cardiac standpoint  Signed, Bruce Kowalski M.D. FACC 

## 2020-04-20 ENCOUNTER — Other Ambulatory Visit: Payer: Self-pay

## 2020-04-20 ENCOUNTER — Encounter: Payer: BC Managed Care – PPO | Attending: Internal Medicine | Admitting: *Deleted

## 2020-04-20 DIAGNOSIS — E785 Hyperlipidemia, unspecified: Secondary | ICD-10-CM | POA: Insufficient documentation

## 2020-04-20 DIAGNOSIS — Z7984 Long term (current) use of oral hypoglycemic drugs: Secondary | ICD-10-CM | POA: Insufficient documentation

## 2020-04-20 DIAGNOSIS — Z79890 Hormone replacement therapy (postmenopausal): Secondary | ICD-10-CM | POA: Diagnosis not present

## 2020-04-20 DIAGNOSIS — Z7902 Long term (current) use of antithrombotics/antiplatelets: Secondary | ICD-10-CM | POA: Diagnosis not present

## 2020-04-20 DIAGNOSIS — Z79899 Other long term (current) drug therapy: Secondary | ICD-10-CM | POA: Insufficient documentation

## 2020-04-20 DIAGNOSIS — I213 ST elevation (STEMI) myocardial infarction of unspecified site: Secondary | ICD-10-CM

## 2020-04-20 DIAGNOSIS — I252 Old myocardial infarction: Secondary | ICD-10-CM | POA: Diagnosis not present

## 2020-04-20 DIAGNOSIS — Z85850 Personal history of malignant neoplasm of thyroid: Secondary | ICD-10-CM | POA: Diagnosis not present

## 2020-04-20 DIAGNOSIS — E119 Type 2 diabetes mellitus without complications: Secondary | ICD-10-CM | POA: Insufficient documentation

## 2020-04-20 DIAGNOSIS — Z7982 Long term (current) use of aspirin: Secondary | ICD-10-CM | POA: Insufficient documentation

## 2020-04-20 DIAGNOSIS — I10 Essential (primary) hypertension: Secondary | ICD-10-CM | POA: Diagnosis not present

## 2020-04-20 LAB — GLUCOSE, CAPILLARY
Glucose-Capillary: 142 mg/dL — ABNORMAL HIGH (ref 70–99)
Glucose-Capillary: 113 mg/dL — ABNORMAL HIGH (ref 70–99)

## 2020-04-20 NOTE — Progress Notes (Signed)
Daily Session Note  Patient Details  Name: Tamerra Merkley MRN: [id number] Date of Birth: 1961/06/16 Referring Provider:     Cardiac Rehab from 04/11/2020 in [facility] Cardiac and Pulmonary Rehab  Referring Provider  [facility]      Encounter Date: 04/20/2020  Check In: Session Check In - 04/20/20 0943      Check-In   Supervising physician immediately available to respond to emergencies  See telemetry face sheet for immediately available ER MD    Location  [facility]-Cardiac & Pulmonary Rehab    Staff Present  Nyoka Cowden, RN, BSN, Willette Pa, MA, RCEP, CCRP, CCET;Amanda Sommer, [place], ACSM CEP, Exercise Physiologist    Virtual Visit  No    Medication changes reported      No    Tobacco Cessation  No Change    Warm-up and Cool-down  Performed on first and last piece of equipment    Resistance Training Performed  No    VAD Patient?  No    PAD/SET Patient?  No          Social History   Tobacco Use  Smoking Status Never Smoker  Smokeless Tobacco Never Used    Goals Met:  Independence with exercise equipment Exercise tolerated well No report of cardiac concerns or symptoms  Goals Unmet:  Not Applicable  Comments: Pt able to follow exercise prescription today without complaint.  Will continue to monitor for progression.   Dr. Emily Filbert is Medical Director for [place] and [facility].

## 2020-04-22 ENCOUNTER — Other Ambulatory Visit: Payer: Self-pay

## 2020-04-22 ENCOUNTER — Encounter: Payer: BC Managed Care – PPO | Admitting: *Deleted

## 2020-04-22 DIAGNOSIS — Z7984 Long term (current) use of oral hypoglycemic drugs: Secondary | ICD-10-CM | POA: Diagnosis not present

## 2020-04-22 DIAGNOSIS — Z7902 Long term (current) use of antithrombotics/antiplatelets: Secondary | ICD-10-CM | POA: Diagnosis not present

## 2020-04-22 DIAGNOSIS — Z79890 Hormone replacement therapy (postmenopausal): Secondary | ICD-10-CM | POA: Diagnosis not present

## 2020-04-22 DIAGNOSIS — E785 Hyperlipidemia, unspecified: Secondary | ICD-10-CM | POA: Diagnosis not present

## 2020-04-22 DIAGNOSIS — I252 Old myocardial infarction: Secondary | ICD-10-CM | POA: Diagnosis not present

## 2020-04-22 DIAGNOSIS — Z85850 Personal history of malignant neoplasm of thyroid: Secondary | ICD-10-CM | POA: Diagnosis not present

## 2020-04-22 DIAGNOSIS — Z79899 Other long term (current) drug therapy: Secondary | ICD-10-CM | POA: Diagnosis not present

## 2020-04-22 DIAGNOSIS — I10 Essential (primary) hypertension: Secondary | ICD-10-CM | POA: Diagnosis not present

## 2020-04-22 DIAGNOSIS — E119 Type 2 diabetes mellitus without complications: Secondary | ICD-10-CM | POA: Diagnosis not present

## 2020-04-22 DIAGNOSIS — I213 ST elevation (STEMI) myocardial infarction of unspecified site: Secondary | ICD-10-CM

## 2020-04-22 DIAGNOSIS — Z7982 Long term (current) use of aspirin: Secondary | ICD-10-CM | POA: Diagnosis not present

## 2020-04-22 LAB — GLUCOSE, CAPILLARY
Glucose-Capillary: 166 mg/dL — ABNORMAL HIGH (ref 70–99)
Glucose-Capillary: 113 mg/dL — ABNORMAL HIGH (ref 70–99)

## 2020-04-22 NOTE — Progress Notes (Signed)
Daily Session Note  Patient Details  Name: Melissa Lamb MRN: [id number] Date of Birth: 1961-02-24 Referring Provider:     Cardiac Rehab from 04/11/2020 in [facility] Cardiac and Pulmonary Rehab  Referring Provider  [facility]      Encounter Date: 04/22/2020  Check In: Session Check In - 04/22/20 0950      Check-In   Supervising physician immediately available to respond to emergencies  See telemetry face sheet for immediately available ER MD    Location  [facility]-Cardiac & Pulmonary Rehab    Staff Present  Nyoka Cowden, RN, BSN, MA;Meredith Sherryll Burger, RN BSN;Melissa Caiola RDN, LDN    Virtual Visit  No    Medication changes reported      No    Tobacco Cessation  No Change    Warm-up and Cool-down  Performed on first and last piece of equipment    Resistance Training Performed  Yes    VAD Patient?  No    PAD/SET Patient?  No      Pain Assessment   Currently in Pain?  No/denies          Social History   Tobacco Use  Smoking Status Never Smoker  Smokeless Tobacco Never Used    Goals Met:  Independence with exercise equipment Exercise tolerated well No report of cardiac concerns or symptoms  Goals Unmet:  Not Applicable  Comments: Pt able to follow exercise prescription today without complaint.  Will continue to monitor for progression.    Dr. Emily Filbert is Medical Director for [place] and [facility].

## 2020-04-27 ENCOUNTER — Encounter: Payer: BC Managed Care – PPO | Admitting: *Deleted

## 2020-04-27 ENCOUNTER — Other Ambulatory Visit: Payer: Self-pay

## 2020-04-27 DIAGNOSIS — I252 Old myocardial infarction: Secondary | ICD-10-CM | POA: Diagnosis not present

## 2020-04-27 DIAGNOSIS — I10 Essential (primary) hypertension: Secondary | ICD-10-CM | POA: Diagnosis not present

## 2020-04-27 DIAGNOSIS — Z79899 Other long term (current) drug therapy: Secondary | ICD-10-CM | POA: Diagnosis not present

## 2020-04-27 DIAGNOSIS — I213 ST elevation (STEMI) myocardial infarction of unspecified site: Secondary | ICD-10-CM

## 2020-04-27 DIAGNOSIS — E119 Type 2 diabetes mellitus without complications: Secondary | ICD-10-CM | POA: Diagnosis not present

## 2020-04-27 DIAGNOSIS — Z7982 Long term (current) use of aspirin: Secondary | ICD-10-CM | POA: Diagnosis not present

## 2020-04-27 DIAGNOSIS — Z79890 Hormone replacement therapy (postmenopausal): Secondary | ICD-10-CM | POA: Diagnosis not present

## 2020-04-27 DIAGNOSIS — Z7984 Long term (current) use of oral hypoglycemic drugs: Secondary | ICD-10-CM | POA: Diagnosis not present

## 2020-04-27 DIAGNOSIS — E785 Hyperlipidemia, unspecified: Secondary | ICD-10-CM | POA: Diagnosis not present

## 2020-04-27 DIAGNOSIS — Z85850 Personal history of malignant neoplasm of thyroid: Secondary | ICD-10-CM | POA: Diagnosis not present

## 2020-04-27 DIAGNOSIS — Z7902 Long term (current) use of antithrombotics/antiplatelets: Secondary | ICD-10-CM | POA: Diagnosis not present

## 2020-04-27 NOTE — Progress Notes (Signed)
Daily Session Note  Patient Details  Name: Melissa Lamb MRN: [id number] Date of Birth: February 07, 1961 Referring Provider:     Cardiac Rehab from 04/11/2020 in [facility] Cardiac and Pulmonary Rehab  Referring Provider  [facility]      Encounter Date: 04/27/2020  Check In: Session Check In - 04/27/20 0954      Check-In   Supervising physician immediately available to respond to emergencies  See telemetry face sheet for immediately available ER MD    Location  [facility]-Cardiac & Pulmonary Rehab    Staff Present  Heath Lark, RN, BSN, CCRP;Melissa Caiola RDN, LDN;Joseph Hood RCP,RRT,BSRT    Virtual Visit  No    Medication changes reported      No    Fall or balance concerns reported     No    Warm-up and Cool-down  Performed on first and last piece of equipment    Resistance Training Performed  Yes    VAD Patient?  No    PAD/SET Patient?  No      Pain Assessment   Currently in Pain?  No/denies          Social History   Tobacco Use  Smoking Status Never Smoker  Smokeless Tobacco Never Used    Goals Met:  Independence with exercise equipment Exercise tolerated well No report of cardiac concerns or symptoms  Goals Unmet:  Not Applicable  Comments: Pt able to follow exercise prescription today without complaint.  Will continue to monitor for progression.    Dr. Emily Filbert is Medical Director for [place] and [facility].

## 2020-04-29 ENCOUNTER — Other Ambulatory Visit: Payer: Self-pay

## 2020-04-29 ENCOUNTER — Encounter: Payer: BC Managed Care – PPO | Admitting: *Deleted

## 2020-04-29 DIAGNOSIS — I252 Old myocardial infarction: Secondary | ICD-10-CM | POA: Diagnosis not present

## 2020-04-29 DIAGNOSIS — Z79899 Other long term (current) drug therapy: Secondary | ICD-10-CM | POA: Diagnosis not present

## 2020-04-29 DIAGNOSIS — Z7902 Long term (current) use of antithrombotics/antiplatelets: Secondary | ICD-10-CM | POA: Diagnosis not present

## 2020-04-29 DIAGNOSIS — Z79890 Hormone replacement therapy (postmenopausal): Secondary | ICD-10-CM | POA: Diagnosis not present

## 2020-04-29 DIAGNOSIS — Z85850 Personal history of malignant neoplasm of thyroid: Secondary | ICD-10-CM | POA: Diagnosis not present

## 2020-04-29 DIAGNOSIS — I213 ST elevation (STEMI) myocardial infarction of unspecified site: Secondary | ICD-10-CM

## 2020-04-29 DIAGNOSIS — I10 Essential (primary) hypertension: Secondary | ICD-10-CM | POA: Diagnosis not present

## 2020-04-29 DIAGNOSIS — E119 Type 2 diabetes mellitus without complications: Secondary | ICD-10-CM | POA: Diagnosis not present

## 2020-04-29 DIAGNOSIS — E785 Hyperlipidemia, unspecified: Secondary | ICD-10-CM | POA: Diagnosis not present

## 2020-04-29 DIAGNOSIS — Z7982 Long term (current) use of aspirin: Secondary | ICD-10-CM | POA: Diagnosis not present

## 2020-04-29 DIAGNOSIS — Z7984 Long term (current) use of oral hypoglycemic drugs: Secondary | ICD-10-CM | POA: Diagnosis not present

## 2020-04-29 NOTE — Progress Notes (Signed)
Daily Session Note  Patient Details  Name: Melissa Lamb MRN: [id number] Date of Birth: April 30, 1961 Referring Provider:     Cardiac Rehab from 04/11/2020 in [facility] Cardiac and Pulmonary Rehab  Referring Provider [facility]      Encounter Date: 04/29/2020  Check In:  Session Check In - 04/29/20 0943      Check-In   Supervising physician immediately available to respond to emergencies See telemetry face sheet for immediately available ER MD    Location [facility]-Cardiac & Pulmonary Rehab    Staff Present Heath Lark, RN, BSN, CCRP;Jessica Kerby, MA, RCEP, CCRP, CCET;Joseph Boyce RCP,RRT,BSRT    Virtual Visit No    Medication changes reported     No    Fall or balance concerns reported    No    Warm-up and Cool-down Performed on first and last piece of equipment    Resistance Training Performed Yes    VAD Patient? No    PAD/SET Patient? No      Pain Assessment   Currently in Pain? No/denies              Social History   Tobacco Use  Smoking Status Never Smoker  Smokeless Tobacco Never Used    Goals Met:  Independence with exercise equipment Exercise tolerated well No report of cardiac concerns or symptoms  Goals Unmet:  Not Applicable  Comments: Pt able to follow exercise prescription today without complaint.  Will continue to monitor for progression.    Dr. Emily Filbert is Medical Director for [place] and [facility].

## 2020-05-02 ENCOUNTER — Other Ambulatory Visit: Payer: Self-pay

## 2020-05-02 ENCOUNTER — Encounter: Payer: BC Managed Care – PPO | Admitting: *Deleted

## 2020-05-02 DIAGNOSIS — I252 Old myocardial infarction: Secondary | ICD-10-CM | POA: Diagnosis not present

## 2020-05-02 DIAGNOSIS — Z7984 Long term (current) use of oral hypoglycemic drugs: Secondary | ICD-10-CM | POA: Diagnosis not present

## 2020-05-02 DIAGNOSIS — I2121 ST elevation (STEMI) myocardial infarction involving left circumflex coronary artery: Secondary | ICD-10-CM | POA: Diagnosis not present

## 2020-05-02 DIAGNOSIS — I208 Other forms of angina pectoris: Secondary | ICD-10-CM | POA: Diagnosis not present

## 2020-05-02 DIAGNOSIS — E785 Hyperlipidemia, unspecified: Secondary | ICD-10-CM | POA: Diagnosis not present

## 2020-05-02 DIAGNOSIS — I10 Essential (primary) hypertension: Secondary | ICD-10-CM | POA: Diagnosis not present

## 2020-05-02 DIAGNOSIS — Z79890 Hormone replacement therapy (postmenopausal): Secondary | ICD-10-CM | POA: Diagnosis not present

## 2020-05-02 DIAGNOSIS — I2510 Atherosclerotic heart disease of native coronary artery without angina pectoris: Secondary | ICD-10-CM | POA: Diagnosis not present

## 2020-05-02 DIAGNOSIS — Z79899 Other long term (current) drug therapy: Secondary | ICD-10-CM | POA: Diagnosis not present

## 2020-05-02 DIAGNOSIS — Z85850 Personal history of malignant neoplasm of thyroid: Secondary | ICD-10-CM | POA: Diagnosis not present

## 2020-05-02 DIAGNOSIS — E119 Type 2 diabetes mellitus without complications: Secondary | ICD-10-CM | POA: Diagnosis not present

## 2020-05-02 DIAGNOSIS — Z7902 Long term (current) use of antithrombotics/antiplatelets: Secondary | ICD-10-CM | POA: Diagnosis not present

## 2020-05-02 DIAGNOSIS — I213 ST elevation (STEMI) myocardial infarction of unspecified site: Secondary | ICD-10-CM

## 2020-05-02 DIAGNOSIS — Z7982 Long term (current) use of aspirin: Secondary | ICD-10-CM | POA: Diagnosis not present

## 2020-05-02 NOTE — Progress Notes (Signed)
Daily Session Note  Patient Details  Name: Melissa Lamb MRN: [id number] Date of Birth: 02/17/1961 Referring Provider:     Cardiac Rehab from 04/11/2020 in [facility] Cardiac and Pulmonary Rehab  Referring Provider [facility]      Encounter Date: 05/02/2020  Check In:  Session Check In - 05/02/20 0952      Check-In   Supervising physician immediately available to respond to emergencies See telemetry face sheet for immediately available ER MD    Location [facility]-Cardiac & Pulmonary Rehab    Staff Present Heath Lark, RN, BSN, Laveda Norman, BS, ACSM CEP, Exercise Physiologist;Joseph Tessie Fass RCP,RRT,BSRT    Virtual Visit No    Medication changes reported     No    Fall or balance concerns reported    No    Warm-up and Cool-down Performed on first and last piece of equipment    Resistance Training Performed Yes    VAD Patient? No    PAD/SET Patient? No      Pain Assessment   Currently in Pain? No/denies              Social History   Tobacco Use  Smoking Status Never Smoker  Smokeless Tobacco Never Used    Goals Met:  Independence with exercise equipment Exercise tolerated well No report of cardiac concerns or symptoms  Goals Unmet:  Not Applicable  Comments: Pt able to follow exercise prescription today without complaint.  Will continue to monitor for progression.    Dr. Emily Filbert is Medical Director for [place] and [facility].

## 2020-05-04 ENCOUNTER — Encounter: Payer: Self-pay | Admitting: *Deleted

## 2020-05-04 DIAGNOSIS — I213 ST elevation (STEMI) myocardial infarction of unspecified site: Secondary | ICD-10-CM

## 2020-05-04 NOTE — Progress Notes (Signed)
Cardiac Individual Treatment Plan  Patient Details  Name: Melissa Lamb MRN: [id number] Date of Birth: 07-11-1961 Referring Provider:     Cardiac Rehab from 04/11/2020 in [facility] Cardiac and Pulmonary Rehab  Referring Provider [facility]      Initial Encounter Date:    Cardiac Rehab from 04/11/2020 in [facility] Cardiac and Pulmonary Rehab  Date 04/11/20      Visit Diagnosis: ST elevation myocardial infarction (STEMI), unspecified artery ([place])  Patient's Home Medications on Admission:  Current Outpatient Medications:  .  aspirin 81 MG chewable tablet, Chew 1 tablet (81 mg total) by mouth daily. Need to take for at least a year., Disp: , Rfl:  .  Cholecalciferol (VITAMIN D-1000 MAX ST) 25 MCG (1000 UT) tablet, Take 2,000 Units by mouth daily. , Disp: , Rfl:  .  fenofibrate 160 MG tablet, Take 1 tablet (160 mg total) by mouth daily., Disp: 90 tablet, Rfl: 2 .  glipiZIDE (GLUCOTROL) 5 MG tablet, Take 1.5 tablets (7.5 mg total) by mouth daily., Disp: 45 tablet, Rfl: 5 .  levothyroxine (SYNTHROID) 88 MCG tablet, Take 88 mcg by mouth daily. , Disp: , Rfl:  .  metFORMIN (GLUCOPHAGE) 500 MG tablet, Take 1 tablet (500 mg total) by mouth 2 (two) times daily., Disp: 60 tablet, Rfl: 5 .  metoprolol succinate (TOPROL-XL) 50 MG 24 hr tablet, Take 1 tablet (50 mg total) by mouth daily. Take with or immediately following a meal., Disp: 30 tablet, Rfl: 0 .  rosuvastatin (CRESTOR) 40 MG tablet, Take 1 tablet (40 mg total) by mouth daily., Disp: 30 tablet, Rfl: 2 .  ticagrelor (BRILINTA) 90 MG TABS tablet, Take 1 tablet (90 mg total) by mouth 2 (two) times daily. Need to take this for at least a year., Disp: 60 tablet, Rfl: 11  Past Medical History: Past Medical History:  Diagnosis Date  . Diabetes mellitus without complication ([place])   . History of chickenpox   . Hyperlipidemia   . Hypertension   . Thyroid cancer ([place])     Tobacco Use: Social History   Tobacco Use  Smoking Status Never Smoker   Smokeless Tobacco Never Used    Labs: Recent Review Flowsheet Data    Labs for ITP Cardiac and Pulmonary Rehab Latest Ref Rng & Units 07/10/2018 10/20/2019 02/22/2020 04/01/2020 04/02/2020   Cholestrol 0 - 200 mg/dL 250(A) - - - 182   LDLCALC 0 - 99 mg/dL 144 - - - 96   HDL >40 mg/dL 31(A) - - - 29(L)   Trlycerides <150 mg/dL 375(A) - - - 284(H)   Hemoglobin A1c 4.8 - 5.6 % 6.0 5.8(A) 6.1(A) 6.0(H) -       Exercise Target Goals: Exercise Program Goal: Individual exercise prescription set using results from initial 6 min walk test and THRR while considering  patient's activity barriers and safety.   Exercise Prescription Goal: Initial exercise prescription builds to 30-45 minutes a day of aerobic activity, 2-3 days per week.  Home exercise guidelines will be given to patient during program as part of exercise prescription that the participant will acknowledge.   Education: Aerobic Exercise & Resistance Training: - Gives group verbal and written instruction on the various components of exercise. Focuses on aerobic and resistive training programs and the benefits of this training and how to safely progress through these programs..   Education: Exercise & Equipment Safety: - Individual verbal instruction and demonstration of equipment use and safety with use of the equipment.   Cardiac Rehab from 04/11/2020 in  [facility] Cardiac and Pulmonary Rehab  Date 04/11/20  Educator AS  Instruction Review Code 1- Verbalizes Understanding      Education: Exercise Physiology & General Exercise Guidelines: - Group verbal and written instruction with models to review the exercise physiology of the cardiovascular system and associated critical values. Provides general exercise guidelines with specific guidelines to those with heart or lung disease.    Education: Flexibility, Balance, Mind/Body Relaxation: Provides group verbal/written instruction on the benefits of flexibility and balance training,  including mind/body exercise modes such as yoga, pilates and tai chi.  Demonstration and skill practice provided.   Activity Barriers & Risk Stratification:   6 Minute Walk:  6 Minute Walk    Row Name 04/11/20 1436         6 Minute Walk   Phase Initial     Distance 1300 feet     Walk Time 6 minutes     # of Rest Breaks 0     MPH 2.46     METS 3.99     RPE 11     Perceived Dyspnea  1     VO2 Peak 13.96     Symptoms No     Resting HR 78 bpm     Resting BP 118/64     Resting Oxygen Saturation  98 %     Exercise Oxygen Saturation  during 6 min walk 100 %     Max Ex. HR 115 bpm     Max Ex. BP 130/62     2 Minute Post BP 122/64            Oxygen Initial Assessment:   Oxygen Re-Evaluation:   Oxygen Discharge (Final Oxygen Re-Evaluation):   Initial Exercise Prescription:  Initial Exercise Prescription - 04/11/20 1400      Date of Initial Exercise RX and Referring Provider   Date 04/11/20    Referring Provider [facility]      Treadmill   MPH 2.5    Grade 3    Minutes 15    METs 4      Recumbant Bike   Level 3    RPM 60    Watts 41    Minutes 15    METs 4      NuStep   Level 3    SPM 80    Minutes 15    METs 4      REL-XR   Level 4    Speed 50    Minutes 15    METs 4      T5 Nustep   Level 2    SPM 80    Minutes 15    METs 4      Prescription Details   Frequency (times per week) 3      Intensity   THRR 40-80% of Max Heartrate 111-144    Ratings of Perceived Exertion 11-15    Perceived Dyspnea 0-4      Resistance Training   Training Prescription Yes    Weight 3 lb    Reps 10-15           Perform Capillary Blood Glucose checks as needed.  Exercise Prescription Changes:  Exercise Prescription Changes    Row Name 04/11/20 1400 04/25/20 1600           Response to Exercise   Blood Pressure (Admit) 118/64 116/72      Blood Pressure (Exercise) 130/62 128/70      Blood Pressure (Exit) 122/64 120/62  Heart Rate (Admit) 78  bpm 76 bpm      Heart Rate (Exercise) 115 bpm 112 bpm      Heart Rate (Exit) 70 bpm 85 bpm      Oxygen Saturation (Admit) 98 % --      Oxygen Saturation (Exercise) 100 % --      Rating of Perceived Exertion (Exercise) 11 12      Perceived Dyspnea (Exercise) 1 --      Symptoms none none      Comments -- third full day of exercise      Duration -- Continue with 30 min of aerobic exercise without signs/symptoms of physical distress.      Intensity -- THRR unchanged        Progression   Progression -- Continue to progress workloads to maintain intensity without signs/symptoms of physical distress.      Average METs -- 3.95        Resistance Training   Training Prescription -- Yes      Weight -- 3 lb      Reps -- 10-15        Interval Training   Interval Training -- No        Treadmill   MPH -- 2.8      Grade -- 3      Minutes -- 15      METs -- 4.3        REL-XR   Level -- 4      Minutes -- 15      METs -- 3.6             Exercise Comments:  Exercise Comments    Row Name 04/15/20 1118           Exercise Comments First full day of exercise!  Patient was oriented to gym and equipment including functions, settings, policies, and procedures.  Patient's individual exercise prescription and treatment plan were reviewed.  All starting workloads were established based on the results of the 6 minute walk test done at initial orientation visit.  The plan for exercise progression was also introduced and progression will be customized based on patient's performance and goals.              Exercise Goals and Review:  Exercise Goals    Row Name 04/11/20 1442             Exercise Goals   Increase Physical Activity Yes       Intervention Provide advice, education, support and counseling about physical activity/exercise needs.;Develop an individualized exercise prescription for aerobic and resistive training based on initial evaluation findings, risk stratification,  comorbidities and participant's personal goals.       Expected Outcomes Short Term: Attend rehab on a regular basis to increase amount of physical activity.;Long Term: Add in home exercise to make exercise part of routine and to increase amount of physical activity.;Long Term: Exercising regularly at least 3-5 days a week.       Increase Strength and Stamina Yes       Intervention Provide advice, education, support and counseling about physical activity/exercise needs.;Develop an individualized exercise prescription for aerobic and resistive training based on initial evaluation findings, risk stratification, comorbidities and participant's personal goals.       Expected Outcomes Short Term: Increase workloads from initial exercise prescription for resistance, speed, and METs.;Short Term: Perform resistance training exercises routinely during rehab and add in resistance training at home;Long Term: Improve cardiorespiratory fitness,  muscular endurance and strength as measured by increased METs and functional capacity (6MWT)       Able to understand and use rate of perceived exertion (RPE) scale Yes       Intervention Provide education and explanation on how to use RPE scale       Expected Outcomes Short Term: Able to use RPE daily in rehab to express subjective intensity level;Long Term:  Able to use RPE to guide intensity level when exercising independently       Knowledge and understanding of Target Heart Rate Range (THRR) Yes       Intervention Provide education and explanation of THRR including how the numbers were predicted and where they are located for reference       Expected Outcomes Short Term: Able to state/look up THRR;Short Term: Able to use daily as guideline for intensity in rehab;Long Term: Able to use THRR to govern intensity when exercising independently       Able to check pulse independently Yes       Intervention Provide education and demonstration on how to check pulse in carotid and  radial arteries.;Review the importance of being able to check your own pulse for safety during independent exercise       Expected Outcomes Short Term: Able to explain why pulse checking is important during independent exercise;Long Term: Able to check pulse independently and accurately       Understanding of Exercise Prescription Yes       Intervention Provide education, explanation, and written materials on patient's individual exercise prescription       Expected Outcomes Short Term: Able to explain program exercise prescription;Long Term: Able to explain home exercise prescription to exercise independently              Exercise Goals Re-Evaluation :  Exercise Goals Re-Evaluation    Row Name 04/15/20 1119 04/25/20 1600           Exercise Goal Re-Evaluation   Exercise Goals Review Knowledge and understanding of Target Heart Rate Range (THRR);Able to understand and use rate of perceived exertion (RPE) scale;Understanding of Exercise Prescription Increase Strength and Stamina;Increase Physical Activity;Understanding of Exercise Prescription      Comments Reviewed RPE and dyspnea scales, THR and program prescription with pt today.  Pt voiced understanding and was given a copy of goals to take home. Velta is off to a good start in rehab.  She has completed her first three full days of exercise and is already up to 2.8 mph on the treadmill.  We will continue to monitor her progress.      Expected Outcomes Short: Use RPE daily to regulate intensity. Long: Follow program prescription in THR. Short: Continue to attend rehab regularly Long: Continue to follow program prescription.             Discharge Exercise Prescription (Final Exercise Prescription Changes):  Exercise Prescription Changes - 04/25/20 1600      Response to Exercise   Blood Pressure (Admit) 116/72    Blood Pressure (Exercise) 128/70    Blood Pressure (Exit) 120/62    Heart Rate (Admit) 76 bpm    Heart Rate (Exercise) 112  bpm    Heart Rate (Exit) 85 bpm    Rating of Perceived Exertion (Exercise) 12    Symptoms none    Comments third full day of exercise    Duration Continue with 30 min of aerobic exercise without signs/symptoms of physical distress.    Intensity THRR unchanged  Progression   Progression Continue to progress workloads to maintain intensity without signs/symptoms of physical distress.    Average METs 3.95      Resistance Training   Training Prescription Yes    Weight 3 lb    Reps 10-15      Interval Training   Interval Training No      Treadmill   MPH 2.8    Grade 3    Minutes 15    METs 4.3      REL-XR   Level 4    Minutes 15    METs 3.6           Nutrition:  Target Goals: Understanding of nutrition guidelines, daily intake of sodium '1500mg'$ , cholesterol '200mg'$ , calories 30% from fat and 7% or less from saturated fats, daily to have 5 or more servings of fruits and vegetables.  Education: Controlling Sodium/Reading Food Labels -Group verbal and written material supporting the discussion of sodium use in heart healthy nutrition. Review and explanation with models, verbal and written materials for utilization of the food label.   Education: General Nutrition Guidelines/Fats and Fiber: -Group instruction provided by verbal, written material, models and posters to present the general guidelines for heart healthy nutrition. Gives an explanation and review of dietary fats and fiber.   Biometrics:  Pre Biometrics - 04/11/20 1448      Pre Biometrics   Height '5\' 8"'$  (1.727 m)    Weight 143 lb 14.4 oz (65.3 kg)    BMI (Calculated) 21.89    Single Leg Stand 30 seconds            Nutrition Therapy Plan and Nutrition Goals:   Nutrition Assessments:  Nutrition Assessments - 04/11/20 1451      MEDFICTS Scores   Pre Score 18           MEDIFICTS Score Key:          ?70 Need to make dietary changes          40-70 Heart Healthy Diet         ? 40 Therapeutic  Level Cholesterol Diet  Nutrition Goals Re-Evaluation:   Nutrition Goals Discharge (Final Nutrition Goals Re-Evaluation):   Psychosocial: Target Goals: Acknowledge presence or absence of significant depression and/or stress, maximize coping skills, provide positive support system. Participant is able to verbalize types and ability to use techniques and skills needed for reducing stress and depression.   Education: Depression - Provides group verbal and written instruction on the correlation between heart/lung disease and depressed mood, treatment options, and the stigmas associated with seeking treatment.   Education: Sleep Hygiene -Provides group verbal and written instruction about how sleep can affect your health.  Define sleep hygiene, discuss sleep cycles and impact of sleep habits. Review good sleep hygiene tips.     Education: Stress and Anxiety: - Provides group verbal and written instruction about the health risks of elevated stress and causes of high stress.  Discuss the correlation between heart/lung disease and anxiety and treatment options. Review healthy ways to manage with stress and anxiety.    Initial Review & Psychosocial Screening:  Initial Psych Review & Screening - 04/07/20 1331      Initial Review   Current issues with None Identified      Family Dynamics   Good Support System? Yes    Comments She can look to her children, daughter, mom and dad for support. Overall she is a positive person but she her husband had  passed away last year.      Barriers   Psychosocial barriers to participate in program There are no identifiable barriers or psychosocial needs.;The patient should benefit from training in stress management and relaxation.      Screening Interventions   Interventions Encouraged to exercise;Program counselor consult;Provide feedback about the scores to participant;To provide support and resources with identified psychosocial needs    Expected  Outcomes Short Term goal: Utilizing psychosocial counselor, staff and physician to assist with identification of specific Stressors or current issues interfering with healing process. Setting desired goal for each stressor or current issue identified.;Long Term Goal: Stressors or current issues are controlled or eliminated.;Short Term goal: Identification and review with participant of any Quality of Life or Depression concerns found by scoring the questionnaire.;Long Term goal: The participant improves quality of Life and PHQ9 Scores as seen by post scores and/or verbalization of changes           Quality of Life Scores:   Quality of Life - 04/11/20 1449      Quality of Life   Select Quality of Life      Quality of Life Scores   Health/Function Pre 22.39 %    Socioeconomic Pre 22.5 %    Psych/Spiritual Pre 21.71 %    Family Pre 25.5 %          Scores of 19 and below usually indicate a poorer quality of life in these areas.  A difference of  2-3 points is a clinically meaningful difference.  A difference of 2-3 points in the total score of the Quality of Life Index has been associated with significant improvement in overall quality of life, self-image, physical symptoms, and general health in studies assessing change in quality of life.  PHQ-9: Recent Review Flowsheet Data    Depression screen [facility] 2/9 04/11/2020 10/20/2019   Decreased Interest 0 0   Down, Depressed, Hopeless 0 0   PHQ - 2 Score 0 0   Altered sleeping 1 -   Tired, decreased energy 1 -   Change in appetite 0 -   Feeling bad or failure about yourself  0 -   Trouble concentrating 0 -   Moving slowly or fidgety/restless 0 -   Suicidal thoughts 0 -   PHQ-9 Score 2 -   Difficult doing work/chores Not difficult at all -     Interpretation of Total Score  Total Score Depression Severity:  1-4 = Minimal depression, 5-9 = Mild depression, 10-14 = Moderate depression, 15-19 = Moderately severe depression, 20-27 = Severe  depression   Psychosocial Evaluation and Intervention:  Psychosocial Evaluation - 04/07/20 1333      Psychosocial Evaluation & Interventions   Interventions Encouraged to exercise with the program and follow exercise prescription    Comments She can look to her children, daughter, mom and dad for support. Overall she is a positive person but she her husband had passed away last year.    Expected Outcomes Short: start Cardiac rehab to improve mood. Long: keep stress at a minimum.    Continue Psychosocial Services  Follow up required by staff           Psychosocial Re-Evaluation:   Psychosocial Discharge (Final Psychosocial Re-Evaluation):   Vocational Rehabilitation: Provide vocational rehab assistance to qualifying candidates.   Vocational Rehab Evaluation & Intervention:   Education: Education Goals: Education classes will be provided on a variety of topics geared toward better understanding of heart health and risk factor modification.  Participant will state understanding/return demonstration of topics presented as noted by education test scores.  Learning Barriers/Preferences:  Learning Barriers/Preferences - 04/07/20 1334      Learning Barriers/Preferences   Learning Barriers None    Learning Preferences None           General Cardiac Education Topics:  AED/CPR: - Group verbal and written instruction with the use of models to demonstrate the basic use of the AED with the basic ABC's of resuscitation.   Anatomy & Physiology of the Heart: - Group verbal and written instruction and models provide basic cardiac anatomy and physiology, with the coronary electrical and arterial systems. Review of Valvular disease and Heart Failure   Cardiac Procedures: - Group verbal and written instruction to review commonly prescribed medications for heart disease. Reviews the medication, class of the drug, and side effects. Includes the steps to properly store meds and maintain  the prescription regimen. (beta blockers and nitrates)   Cardiac Medications I: - Group verbal and written instruction to review commonly prescribed medications for heart disease. Reviews the medication, class of the drug, and side effects. Includes the steps to properly store meds and maintain the prescription regimen.   Cardiac Medications II: -Group verbal and written instruction to review commonly prescribed medications for heart disease. Reviews the medication, class of the drug, and side effects. (all other drug classes)    Go Sex-Intimacy & Heart Disease, Get SMART - Goal Setting: - Group verbal and written instruction through game format to discuss heart disease and the return to sexual intimacy. Provides group verbal and written material to discuss and apply goal setting through the application of the S.M.A.R.T. Method.   Other Matters of the Heart: - Provides group verbal, written materials and models to describe Stable Angina and Peripheral Artery. Includes description of the disease process and treatment options available to the cardiac patient.   Infection Prevention: - Provides verbal and written material to individual with discussion of infection control including proper hand washing and proper equipment cleaning during exercise session.   Cardiac Rehab from 04/11/2020 in [facility] Cardiac and Pulmonary Rehab  Date 04/11/20  Educator AS  Instruction Review Code 1- Verbalizes Understanding      Falls Prevention: - Provides verbal and written material to individual with discussion of falls prevention and safety.   Cardiac Rehab from 04/11/2020 in [facility] Cardiac and Pulmonary Rehab  Date 04/11/20  Educator AS  Instruction Review Code 1- Verbalizes Understanding      Other: -Provides group and verbal instruction on various topics (see comments)   Knowledge Questionnaire Score:  Knowledge Questionnaire Score - 04/11/20 1449      Knowledge Questionnaire Score   Pre Score  25/26           Core Components/Risk Factors/Patient Goals at Admission:  Personal Goals and Risk Factors at Admission - 04/11/20 1452      Core Components/Risk Factors/Patient Goals on Admission    Weight Management Yes    Intervention Weight Management: Develop a combined nutrition and exercise program designed to reach desired caloric intake, while maintaining appropriate intake of nutrient and fiber, sodium and fats, and appropriate energy expenditure required for the weight goal.;Weight Management: Provide education and appropriate resources to help participant work on and attain dietary goals.;Weight Management/Obesity: Establish reasonable short term and long term weight goals.    Admit Weight 143 lb 14.4 oz (65.3 kg)    Goal Weight: Short Term 143 lb (64.9 kg)    Goal Weight:  Long Term 143 lb (64.9 kg)    Expected Outcomes Weight Maintenance: Understanding of the daily nutrition guidelines, which includes 25-35% calories from fat, 7% or less cal from saturated fats, less than '200mg'$  cholesterol, less than 1.5gm of sodium, & 5 or more servings of fruits and vegetables daily;Long Term: Adherence to nutrition and physical activity/exercise program aimed toward attainment of established weight goal;Short Term: Continue to assess and modify interventions until short term weight is achieved;Understanding recommendations for meals to include 15-35% energy as protein, 25-35% energy from fat, 35-60% energy from carbohydrates, less than '200mg'$  of dietary cholesterol, 20-35 gm of total fiber daily;Understanding of distribution of calorie intake throughout the day with the consumption of 4-5 meals/snacks    Diabetes Yes    Intervention Provide education about signs/symptoms and action to take for hypo/hyperglycemia.;Provide education about proper nutrition, including hydration, and aerobic/resistive exercise prescription along with prescribed medications to achieve blood glucose in normal ranges: Fasting  glucose 65-99 mg/dL    Expected Outcomes Short Term: Participant verbalizes understanding of the signs/symptoms and immediate care of hyper/hypoglycemia, proper foot care and importance of medication, aerobic/resistive exercise and nutrition plan for blood glucose control.;Long Term: Attainment of HbA1C < 7%.    Intervention Provide education on lifestyle modifcations including regular physical activity/exercise, weight management, moderate sodium restriction and increased consumption of fresh fruit, vegetables, and low fat dairy, alcohol moderation, and smoking cessation.;Monitor prescription use compliance.    Expected Outcomes Short Term: Continued assessment and intervention until BP is < 140/10m HG in hypertensive participants. < 130/822mHG in hypertensive participants with diabetes, heart failure or chronic kidney disease.;Long Term: Maintenance of blood pressure at goal levels.    Intervention Provide education and support for participant on nutrition & aerobic/resistive exercise along with prescribed medications to achieve LDL '70mg'$ , HDL >'40mg'$ .    Expected Outcomes Short Term: Participant states understanding of desired cholesterol values and is compliant with medications prescribed. Participant is following exercise prescription and nutrition guidelines.;Long Term: Cholesterol controlled with medications as prescribed, with individualized exercise RX and with personalized nutrition plan. Value goals: LDL < '70mg'$ , HDL > 40 mg.           Education:Diabetes - Individual verbal and written instruction to review signs/symptoms of diabetes, desired ranges of glucose level fasting, after meals and with exercise. Acknowledge that pre and post exercise glucose checks will be done for 3 sessions at entry of program.   [facility]  Date 04/07/20  [facility] 1- Verbalizes Understanding      Education: Know Your Numbers  and Risk Factors: -Group verbal and written instruction about important numbers in your health.  Discussion of what are risk factors and how they play a role in the disease process.  Review of Cholesterol, Blood Pressure, Diabetes, and BMI and the role they play in your overall health.   Core Components/Risk Factors/Patient Goals Review:    Core Components/Risk Factors/Patient Goals at Discharge (Final Review):    ITP Comments:  ITP Comments    Row Name 04/07/20 1337 04/11/20 1455 04/15/20 1117 05/04/20 0643     ITP Comments Virtual Visit completed. Patient informed on EP and RD appointment and 6 Minute walk test. Patient also informed of patient health questionnaires on My Chart. Patient Verbalizes understanding. [facility] Completed 6MWT and gym orientation.  Initial ITP created and sent for review to Dr. MaEmily FilbertMedical Director. First  full day of exercise!  Patient was oriented to gym and equipment including functions, settings, policies, and procedures.  Patient's individual exercise prescription and treatment plan were reviewed.  All starting workloads were established based on the results of the 6 minute walk test done at initial orientation visit.  The plan for exercise progression was also introduced and progression will be customized based on patient's performance and goals. 30 Day review completed. Medical Director ITP review done, changes made as directed, and signed approval by Medical Director.           Comments: 30 Day review completed. Medical Director ITP review done, changes made as directed, and signed approval by Medical Director.

## 2020-05-06 ENCOUNTER — Other Ambulatory Visit: Payer: Self-pay

## 2020-05-06 ENCOUNTER — Encounter: Payer: BC Managed Care – PPO | Admitting: *Deleted

## 2020-05-06 DIAGNOSIS — I252 Old myocardial infarction: Secondary | ICD-10-CM | POA: Diagnosis not present

## 2020-05-06 DIAGNOSIS — E119 Type 2 diabetes mellitus without complications: Secondary | ICD-10-CM | POA: Diagnosis not present

## 2020-05-06 DIAGNOSIS — I213 ST elevation (STEMI) myocardial infarction of unspecified site: Secondary | ICD-10-CM

## 2020-05-06 DIAGNOSIS — Z85850 Personal history of malignant neoplasm of thyroid: Secondary | ICD-10-CM | POA: Diagnosis not present

## 2020-05-06 DIAGNOSIS — I10 Essential (primary) hypertension: Secondary | ICD-10-CM | POA: Diagnosis not present

## 2020-05-06 DIAGNOSIS — Z7984 Long term (current) use of oral hypoglycemic drugs: Secondary | ICD-10-CM | POA: Diagnosis not present

## 2020-05-06 DIAGNOSIS — Z7902 Long term (current) use of antithrombotics/antiplatelets: Secondary | ICD-10-CM | POA: Diagnosis not present

## 2020-05-06 DIAGNOSIS — Z79890 Hormone replacement therapy (postmenopausal): Secondary | ICD-10-CM | POA: Diagnosis not present

## 2020-05-06 DIAGNOSIS — Z7982 Long term (current) use of aspirin: Secondary | ICD-10-CM | POA: Diagnosis not present

## 2020-05-06 DIAGNOSIS — E785 Hyperlipidemia, unspecified: Secondary | ICD-10-CM | POA: Diagnosis not present

## 2020-05-06 DIAGNOSIS — Z79899 Other long term (current) drug therapy: Secondary | ICD-10-CM | POA: Diagnosis not present

## 2020-05-06 NOTE — Progress Notes (Signed)
Daily Session Note  Patient Details  Name: Markasia Carrol MRN: [id number] Date of Birth: 1961-09-17 Referring Provider:     Cardiac Rehab from 04/11/2020 in [facility] Cardiac and Pulmonary Rehab  Referring Provider [facility]      Encounter Date: 05/06/2020  Check In:  Session Check In - 05/06/20 6967      Check-In   Supervising physician immediately available to respond to emergencies See telemetry face sheet for immediately available ER MD    Location [facility]-Cardiac & Pulmonary Rehab    Staff Present Renita Papa, RN BSN;Joseph [address] [place], [place], [place], CCRP, CCET    Virtual Visit No    Medication changes reported     No    Fall or balance concerns reported    No    Warm-up and Cool-down Performed on first and last piece of equipment    Resistance Training Performed Yes    VAD Patient? No    PAD/SET Patient? No      Pain Assessment   Currently in Pain? No/denies              Social History   Tobacco Use  Smoking Status Never Smoker  Smokeless Tobacco Never Used    Goals Met:  Independence with exercise equipment Exercise tolerated well No report of cardiac concerns or symptoms Strength training completed today  Goals Unmet:  Not Applicable  Comments: Pt able to follow exercise prescription today without complaint.  Will continue to monitor for progression.    Dr. Emily Filbert is Medical Director for [place] and [facility].

## 2020-05-09 ENCOUNTER — Other Ambulatory Visit: Payer: Self-pay

## 2020-05-09 ENCOUNTER — Encounter: Payer: BC Managed Care – PPO | Admitting: *Deleted

## 2020-05-09 DIAGNOSIS — Z7902 Long term (current) use of antithrombotics/antiplatelets: Secondary | ICD-10-CM | POA: Diagnosis not present

## 2020-05-09 DIAGNOSIS — Z7982 Long term (current) use of aspirin: Secondary | ICD-10-CM | POA: Diagnosis not present

## 2020-05-09 DIAGNOSIS — I10 Essential (primary) hypertension: Secondary | ICD-10-CM | POA: Diagnosis not present

## 2020-05-09 DIAGNOSIS — I213 ST elevation (STEMI) myocardial infarction of unspecified site: Secondary | ICD-10-CM

## 2020-05-09 DIAGNOSIS — Z85850 Personal history of malignant neoplasm of thyroid: Secondary | ICD-10-CM | POA: Diagnosis not present

## 2020-05-09 DIAGNOSIS — Z79890 Hormone replacement therapy (postmenopausal): Secondary | ICD-10-CM | POA: Diagnosis not present

## 2020-05-09 DIAGNOSIS — E785 Hyperlipidemia, unspecified: Secondary | ICD-10-CM | POA: Diagnosis not present

## 2020-05-09 DIAGNOSIS — I252 Old myocardial infarction: Secondary | ICD-10-CM | POA: Diagnosis not present

## 2020-05-09 DIAGNOSIS — Z7984 Long term (current) use of oral hypoglycemic drugs: Secondary | ICD-10-CM | POA: Diagnosis not present

## 2020-05-09 DIAGNOSIS — E119 Type 2 diabetes mellitus without complications: Secondary | ICD-10-CM | POA: Diagnosis not present

## 2020-05-09 DIAGNOSIS — Z79899 Other long term (current) drug therapy: Secondary | ICD-10-CM | POA: Diagnosis not present

## 2020-05-09 NOTE — Progress Notes (Signed)
Daily Session Note  Patient Details  Name: Gwen Sarvis MRN: [id number] Date of Birth: 1961/04/26 Referring Provider:     Cardiac Rehab from 04/11/2020 in [facility] Cardiac and Pulmonary Rehab  Referring Provider [facility]      Encounter Date: 05/09/2020  Check In:  Session Check In - 05/09/20 0944      Check-In   Supervising physician immediately available to respond to emergencies See telemetry face sheet for immediately available ER MD    Location [facility]-Cardiac & Pulmonary Rehab    Staff Present Heath Lark, RN, BSN, Laveda Norman, BS, ACSM CEP, Exercise Physiologist;Joseph Tessie Fass RCP,RRT,BSRT    Virtual Visit No    Medication changes reported     No    Fall or balance concerns reported    No    Warm-up and Cool-down Performed on first and last piece of equipment    Resistance Training Performed Yes    VAD Patient? No    PAD/SET Patient? No      Pain Assessment   Currently in Pain? No/denies              Social History   Tobacco Use  Smoking Status Never Smoker  Smokeless Tobacco Never Used    Goals Met:  Independence with exercise equipment Exercise tolerated well No report of cardiac concerns or symptoms  Goals Unmet:  Not Applicable  Comments: Pt able to follow exercise prescription today without complaint.  Will continue to monitor for progression.    Dr. Emily Filbert is Medical Director for [place] and [facility].

## 2020-05-11 ENCOUNTER — Encounter: Payer: BC Managed Care – PPO | Admitting: *Deleted

## 2020-05-11 ENCOUNTER — Other Ambulatory Visit: Payer: Self-pay

## 2020-05-11 DIAGNOSIS — I10 Essential (primary) hypertension: Secondary | ICD-10-CM | POA: Diagnosis not present

## 2020-05-11 DIAGNOSIS — I213 ST elevation (STEMI) myocardial infarction of unspecified site: Secondary | ICD-10-CM

## 2020-05-11 DIAGNOSIS — Z79890 Hormone replacement therapy (postmenopausal): Secondary | ICD-10-CM | POA: Diagnosis not present

## 2020-05-11 DIAGNOSIS — E119 Type 2 diabetes mellitus without complications: Secondary | ICD-10-CM | POA: Diagnosis not present

## 2020-05-11 DIAGNOSIS — Z79899 Other long term (current) drug therapy: Secondary | ICD-10-CM | POA: Diagnosis not present

## 2020-05-11 DIAGNOSIS — Z85850 Personal history of malignant neoplasm of thyroid: Secondary | ICD-10-CM | POA: Diagnosis not present

## 2020-05-11 DIAGNOSIS — Z7984 Long term (current) use of oral hypoglycemic drugs: Secondary | ICD-10-CM | POA: Diagnosis not present

## 2020-05-11 DIAGNOSIS — I252 Old myocardial infarction: Secondary | ICD-10-CM | POA: Diagnosis not present

## 2020-05-11 DIAGNOSIS — Z7902 Long term (current) use of antithrombotics/antiplatelets: Secondary | ICD-10-CM | POA: Diagnosis not present

## 2020-05-11 DIAGNOSIS — Z7982 Long term (current) use of aspirin: Secondary | ICD-10-CM | POA: Diagnosis not present

## 2020-05-11 DIAGNOSIS — E785 Hyperlipidemia, unspecified: Secondary | ICD-10-CM | POA: Diagnosis not present

## 2020-05-11 NOTE — Progress Notes (Signed)
Daily Session Note  Patient Details  Name: Melissa Lamb MRN: [id number] Date of Birth: 01/15/1961 Referring Provider:     Cardiac Rehab from 04/11/2020 in [facility] Cardiac and Pulmonary Rehab  Referring Provider [facility]      Encounter Date: 05/11/2020  Check In:  Session Check In - 05/11/20 0946      Check-In   Supervising physician immediately available to respond to emergencies See telemetry face sheet for immediately available ER MD    Location [facility]-Cardiac & Pulmonary Rehab    Staff Present Heath Lark, RN, BSN, CCRP;Melissa [place] RDN, Rowe Pavy, BA, ACSM CEP, Exercise Physiologist    Virtual Visit No    Medication changes reported     No    Fall or balance concerns reported    No    Warm-up and Cool-down Performed on first and last piece of equipment    Resistance Training Performed Yes    VAD Patient? No    PAD/SET Patient? No      Pain Assessment   Currently in Pain? No/denies              Social History   Tobacco Use  Smoking Status Never Smoker  Smokeless Tobacco Never Used    Goals Met:  Independence with exercise equipment Exercise tolerated well No report of cardiac concerns or symptoms  Goals Unmet:  Not Applicable  Comments: Pt able to follow exercise prescription today without complaint.  Will continue to monitor for progression.    Dr. Emily Filbert is Medical Director for [place] and [facility].

## 2020-05-13 ENCOUNTER — Encounter: Payer: BC Managed Care – PPO | Admitting: *Deleted

## 2020-05-13 ENCOUNTER — Other Ambulatory Visit: Payer: Self-pay

## 2020-05-13 DIAGNOSIS — I252 Old myocardial infarction: Secondary | ICD-10-CM | POA: Diagnosis not present

## 2020-05-13 DIAGNOSIS — Z7902 Long term (current) use of antithrombotics/antiplatelets: Secondary | ICD-10-CM | POA: Diagnosis not present

## 2020-05-13 DIAGNOSIS — E785 Hyperlipidemia, unspecified: Secondary | ICD-10-CM | POA: Diagnosis not present

## 2020-05-13 DIAGNOSIS — Z7984 Long term (current) use of oral hypoglycemic drugs: Secondary | ICD-10-CM | POA: Diagnosis not present

## 2020-05-13 DIAGNOSIS — Z79899 Other long term (current) drug therapy: Secondary | ICD-10-CM | POA: Diagnosis not present

## 2020-05-13 DIAGNOSIS — Z79890 Hormone replacement therapy (postmenopausal): Secondary | ICD-10-CM | POA: Diagnosis not present

## 2020-05-13 DIAGNOSIS — I10 Essential (primary) hypertension: Secondary | ICD-10-CM | POA: Diagnosis not present

## 2020-05-13 DIAGNOSIS — I213 ST elevation (STEMI) myocardial infarction of unspecified site: Secondary | ICD-10-CM

## 2020-05-13 DIAGNOSIS — E119 Type 2 diabetes mellitus without complications: Secondary | ICD-10-CM | POA: Diagnosis not present

## 2020-05-13 DIAGNOSIS — Z7982 Long term (current) use of aspirin: Secondary | ICD-10-CM | POA: Diagnosis not present

## 2020-05-13 DIAGNOSIS — Z85850 Personal history of malignant neoplasm of thyroid: Secondary | ICD-10-CM | POA: Diagnosis not present

## 2020-05-13 NOTE — Progress Notes (Signed)
Daily Session Note  Patient Details  Name: Melissa Lamb MRN: [id number] Date of Birth: 12/24/1960 Referring Provider:     Cardiac Rehab from 04/11/2020 in [facility] Cardiac and Pulmonary Rehab  Referring Provider [place]      Encounter Date: 05/13/2020  Check In:  Session Check In - 05/13/20 0954      Check-In   Staff Present Mary Jo Abernethy, RN, BSN, MA;Joseph Hood RCP,RRT,BSRT;Meredith Craven, RN BSN    Virtual Visit No    Medication changes reported     No    Fall or balance concerns reported    No    Tobacco Cessation No Change    Warm-up and Cool-down Performed on first and last piece of equipment    Resistance Training Performed Yes    VAD Patient? No    PAD/SET Patient? No      Pain Assessment   Currently in Pain? No/denies              Social History   Tobacco Use  Smoking Status Never Smoker  Smokeless Tobacco Never Used    Goals Met:  Independence with exercise equipment Exercise tolerated well No report of cardiac concerns or symptoms Strength training completed today  Goals Unmet:  Not Applicable  Comments: Pt able to follow exercise prescription today without complaint.  Will continue to monitor for progression.   Dr. Mark Miller is Medical Director for [facility] and [facility]. 

## 2020-05-16 ENCOUNTER — Encounter: Payer: BC Managed Care – PPO | Admitting: *Deleted

## 2020-05-16 ENCOUNTER — Other Ambulatory Visit: Payer: Self-pay

## 2020-05-16 DIAGNOSIS — I10 Essential (primary) hypertension: Secondary | ICD-10-CM | POA: Diagnosis not present

## 2020-05-16 DIAGNOSIS — Z7984 Long term (current) use of oral hypoglycemic drugs: Secondary | ICD-10-CM | POA: Diagnosis not present

## 2020-05-16 DIAGNOSIS — E119 Type 2 diabetes mellitus without complications: Secondary | ICD-10-CM | POA: Diagnosis not present

## 2020-05-16 DIAGNOSIS — E785 Hyperlipidemia, unspecified: Secondary | ICD-10-CM | POA: Diagnosis not present

## 2020-05-16 DIAGNOSIS — Z7902 Long term (current) use of antithrombotics/antiplatelets: Secondary | ICD-10-CM | POA: Diagnosis not present

## 2020-05-16 DIAGNOSIS — I252 Old myocardial infarction: Secondary | ICD-10-CM | POA: Diagnosis not present

## 2020-05-16 DIAGNOSIS — Z7982 Long term (current) use of aspirin: Secondary | ICD-10-CM | POA: Diagnosis not present

## 2020-05-16 DIAGNOSIS — I213 ST elevation (STEMI) myocardial infarction of unspecified site: Secondary | ICD-10-CM

## 2020-05-16 DIAGNOSIS — Z85850 Personal history of malignant neoplasm of thyroid: Secondary | ICD-10-CM | POA: Diagnosis not present

## 2020-05-16 DIAGNOSIS — Z79890 Hormone replacement therapy (postmenopausal): Secondary | ICD-10-CM | POA: Diagnosis not present

## 2020-05-16 DIAGNOSIS — Z79899 Other long term (current) drug therapy: Secondary | ICD-10-CM | POA: Diagnosis not present

## 2020-05-16 NOTE — Progress Notes (Signed)
Daily Session Note  Patient Details  Name: Taquana Bartley MRN: [id number] Date of Birth: [birth year] Referring Provider:     Cardiac Rehab from 04/11/2020 in [facility] Cardiac and Pulmonary Rehab  Referring Provider [facility]      Encounter Date: 05/16/2020  Check In:  Session Check In - 05/16/20 6859      Check-In   Supervising physician immediately available to respond to emergencies See telemetry face sheet for immediately available ER MD    Location [facility]-Cardiac & Pulmonary Rehab    Staff Present Heath Lark, RN, BSN, CCRP;Joseph Foy [place], [place], ACSM CEP, Exercise Physiologist    Virtual Visit No    Medication changes reported     No    Fall or balance concerns reported    No    Warm-up and Cool-down Performed on first and last piece of equipment    Resistance Training Performed Yes    VAD Patient? No    PAD/SET Patient? No      Pain Assessment   Currently in Pain? No/denies              Social History   Tobacco Use  Smoking Status Never Smoker  Smokeless Tobacco Never Used    Goals Met:  Independence with exercise equipment Exercise tolerated well No report of cardiac concerns or symptoms  Goals Unmet:  Not Applicable  Comments: Pt able to follow exercise prescription today without complaint.  Will continue to monitor for progression.    Dr. Emily Filbert is Medical Director for [place] and [facility].

## 2020-05-17 DIAGNOSIS — C73 Malignant neoplasm of thyroid gland: Secondary | ICD-10-CM | POA: Diagnosis not present

## 2020-05-17 DIAGNOSIS — E890 Postprocedural hypothyroidism: Secondary | ICD-10-CM | POA: Diagnosis not present

## 2020-05-18 ENCOUNTER — Other Ambulatory Visit: Payer: Self-pay

## 2020-05-18 DIAGNOSIS — E785 Hyperlipidemia, unspecified: Secondary | ICD-10-CM | POA: Diagnosis not present

## 2020-05-18 DIAGNOSIS — Z79899 Other long term (current) drug therapy: Secondary | ICD-10-CM | POA: Diagnosis not present

## 2020-05-18 DIAGNOSIS — Z7982 Long term (current) use of aspirin: Secondary | ICD-10-CM | POA: Diagnosis not present

## 2020-05-18 DIAGNOSIS — I213 ST elevation (STEMI) myocardial infarction of unspecified site: Secondary | ICD-10-CM

## 2020-05-18 DIAGNOSIS — I252 Old myocardial infarction: Secondary | ICD-10-CM | POA: Diagnosis not present

## 2020-05-18 DIAGNOSIS — I10 Essential (primary) hypertension: Secondary | ICD-10-CM | POA: Diagnosis not present

## 2020-05-18 DIAGNOSIS — E119 Type 2 diabetes mellitus without complications: Secondary | ICD-10-CM | POA: Diagnosis not present

## 2020-05-18 DIAGNOSIS — Z7984 Long term (current) use of oral hypoglycemic drugs: Secondary | ICD-10-CM | POA: Diagnosis not present

## 2020-05-18 DIAGNOSIS — Z7902 Long term (current) use of antithrombotics/antiplatelets: Secondary | ICD-10-CM | POA: Diagnosis not present

## 2020-05-18 DIAGNOSIS — Z79890 Hormone replacement therapy (postmenopausal): Secondary | ICD-10-CM | POA: Diagnosis not present

## 2020-05-18 DIAGNOSIS — Z85850 Personal history of malignant neoplasm of thyroid: Secondary | ICD-10-CM | POA: Diagnosis not present

## 2020-05-18 NOTE — Progress Notes (Signed)
Daily Session Note  Patient Details  Name: Melissa Lamb MRN: [id number] Date of Birth: 17-Aug-1961 Referring Provider:     Cardiac Rehab from 04/11/2020 in [facility] Cardiac and Pulmonary Rehab  Referring Provider [facility]      Encounter Date: 05/18/2020  Check In:  Session Check In - 05/18/20 1950      Check-In   Supervising physician immediately available to respond to emergencies See telemetry face sheet for immediately available ER MD    Location [facility]-Cardiac & Pulmonary Rehab    Staff Present Vida Rigger RN, BSN;Jessica Luan [place], MA, RCEP, CCRP, CCET;Joseph Lou [place], [place] Exercise Physiologist    Virtual Visit No    Medication changes reported     No    Fall or balance concerns reported    No    Warm-up and Cool-down Performed on first and last piece of equipment    Resistance Training Performed Yes    VAD Patient? No    PAD/SET Patient? No      Pain Assessment   Currently in Pain? No/denies              Social History   Tobacco Use  Smoking Status Never Smoker  Smokeless Tobacco Never Used    Goals Met:  Independence with exercise equipment Exercise tolerated well No report of cardiac concerns or symptoms Strength training completed today  Goals Unmet:  Not Applicable  Comments: Pt able to follow exercise prescription today without complaint.  Will continue to monitor for progression.   Dr. Emily Filbert is Medical Director for [place] and [facility].

## 2020-05-20 ENCOUNTER — Encounter: Payer: BC Managed Care – PPO | Attending: Internal Medicine | Admitting: *Deleted

## 2020-05-20 ENCOUNTER — Other Ambulatory Visit: Payer: Self-pay

## 2020-05-20 DIAGNOSIS — Z79890 Hormone replacement therapy (postmenopausal): Secondary | ICD-10-CM | POA: Diagnosis not present

## 2020-05-20 DIAGNOSIS — Z79899 Other long term (current) drug therapy: Secondary | ICD-10-CM | POA: Diagnosis not present

## 2020-05-20 DIAGNOSIS — Z85850 Personal history of malignant neoplasm of thyroid: Secondary | ICD-10-CM | POA: Diagnosis not present

## 2020-05-20 DIAGNOSIS — I213 ST elevation (STEMI) myocardial infarction of unspecified site: Secondary | ICD-10-CM

## 2020-05-20 DIAGNOSIS — Z7984 Long term (current) use of oral hypoglycemic drugs: Secondary | ICD-10-CM | POA: Insufficient documentation

## 2020-05-20 DIAGNOSIS — E785 Hyperlipidemia, unspecified: Secondary | ICD-10-CM | POA: Insufficient documentation

## 2020-05-20 DIAGNOSIS — E119 Type 2 diabetes mellitus without complications: Secondary | ICD-10-CM | POA: Insufficient documentation

## 2020-05-20 DIAGNOSIS — Z7982 Long term (current) use of aspirin: Secondary | ICD-10-CM | POA: Diagnosis not present

## 2020-05-20 DIAGNOSIS — Z7902 Long term (current) use of antithrombotics/antiplatelets: Secondary | ICD-10-CM | POA: Insufficient documentation

## 2020-05-20 DIAGNOSIS — I10 Essential (primary) hypertension: Secondary | ICD-10-CM | POA: Diagnosis not present

## 2020-05-20 DIAGNOSIS — I252 Old myocardial infarction: Secondary | ICD-10-CM | POA: Diagnosis not present

## 2020-05-20 NOTE — Progress Notes (Signed)
Daily Session Note  Patient Details  Name: Melissa Lamb MRN: [id number] Date of Birth: 15-Sep-1961 Referring Provider:     Cardiac Rehab from 04/11/2020 in [facility] Card[facility] Rehab  Referring Provider [facility]      Encounter Date: 05/20/2020  Check In:  Session Check In - 05/20/20 0954      Check-In   Supervising physician immediately available to respond to emergencies See telemetry face sheet for immediately available ER MD    Staff Present Nyoka Cowden, RN, BSN, MA;Joseph [address] [place], [place], RCEP, CCRP, CCET    Virtual Visit No    Medication changes reported     No    Fall or balance concerns reported    No    Tobacco Cessation No Change    Warm-up and Cool-down Performed on first and last piece of equipment    Resistance Training Performed Yes              Social History   Tobacco Use  Smoking Status Never Smoker  Smokeless Tobacco Never Used    Goals Met:  Independence with exercise equipment Exercise tolerated well No report of cardiac concerns or symptoms Strength training completed today  Goals Unmet:  Not Applicable  Comments: Pt able to follow exercise prescription today without complaint.  Will continue to monitor for progression.    Dr. Emily Filbert is Medical Director for [place] and [facility].

## 2020-05-27 ENCOUNTER — Encounter: Payer: BC Managed Care – PPO | Admitting: *Deleted

## 2020-05-27 ENCOUNTER — Other Ambulatory Visit: Payer: Self-pay

## 2020-05-27 DIAGNOSIS — Z85850 Personal history of malignant neoplasm of thyroid: Secondary | ICD-10-CM | POA: Diagnosis not present

## 2020-05-27 DIAGNOSIS — I252 Old myocardial infarction: Secondary | ICD-10-CM | POA: Diagnosis not present

## 2020-05-27 DIAGNOSIS — E785 Hyperlipidemia, unspecified: Secondary | ICD-10-CM | POA: Diagnosis not present

## 2020-05-27 DIAGNOSIS — I10 Essential (primary) hypertension: Secondary | ICD-10-CM | POA: Diagnosis not present

## 2020-05-27 DIAGNOSIS — Z79890 Hormone replacement therapy (postmenopausal): Secondary | ICD-10-CM | POA: Diagnosis not present

## 2020-05-27 DIAGNOSIS — Z79899 Other long term (current) drug therapy: Secondary | ICD-10-CM | POA: Diagnosis not present

## 2020-05-27 DIAGNOSIS — E119 Type 2 diabetes mellitus without complications: Secondary | ICD-10-CM | POA: Diagnosis not present

## 2020-05-27 DIAGNOSIS — Z7984 Long term (current) use of oral hypoglycemic drugs: Secondary | ICD-10-CM | POA: Diagnosis not present

## 2020-05-27 DIAGNOSIS — Z7902 Long term (current) use of antithrombotics/antiplatelets: Secondary | ICD-10-CM | POA: Diagnosis not present

## 2020-05-27 DIAGNOSIS — Z7982 Long term (current) use of aspirin: Secondary | ICD-10-CM | POA: Diagnosis not present

## 2020-05-27 DIAGNOSIS — I213 ST elevation (STEMI) myocardial infarction of unspecified site: Secondary | ICD-10-CM

## 2020-05-27 NOTE — Progress Notes (Signed)
Daily Session Note  Patient Details  Name: Melissa Lamb MRN: [id number] Date of Birth: March 15, 1961 Referring Provider:     Cardiac Rehab from 04/11/2020 in [facility] Cardiac and Pulmonary Rehab  Referring Provider [facility]      Encounter Date: 05/27/2020  Check In:  Session Check In - 05/27/20 5597      Check-In   Supervising physician immediately available to respond to emergencies See telemetry face sheet for immediately available ER MD    Location [facility]-Cardiac & Pulmonary Rehab    Staff Present Nyoka Cowden, RN, BSN, MA;Joseph [address] [place], [place], RCEP, CCRP, CCET    Virtual Visit No    Medication changes reported     No    Tobacco Cessation No Change    Warm-up and Cool-down Performed on first and last piece of equipment    PAD/SET Patient? No      Pain Assessment   Currently in Pain? No/denies              Social History   Tobacco Use  Smoking Status Never Smoker  Smokeless Tobacco Never Used    Goals Met:  Independence with exercise equipment Exercise tolerated well No report of cardiac concerns or symptoms Strength training completed today  Goals Unmet:  Not Applicable  Comments: Pt able to follow exercise prescription today without complaint.  Will continue to monitor for progression.   Dr. Emily Filbert is Medical Director for [place] and [facility].

## 2020-05-30 ENCOUNTER — Other Ambulatory Visit: Payer: Self-pay

## 2020-05-30 ENCOUNTER — Encounter: Payer: BC Managed Care – PPO | Admitting: *Deleted

## 2020-05-30 DIAGNOSIS — I10 Essential (primary) hypertension: Secondary | ICD-10-CM | POA: Diagnosis not present

## 2020-05-30 DIAGNOSIS — Z79890 Hormone replacement therapy (postmenopausal): Secondary | ICD-10-CM | POA: Diagnosis not present

## 2020-05-30 DIAGNOSIS — Z7982 Long term (current) use of aspirin: Secondary | ICD-10-CM | POA: Diagnosis not present

## 2020-05-30 DIAGNOSIS — E119 Type 2 diabetes mellitus without complications: Secondary | ICD-10-CM | POA: Diagnosis not present

## 2020-05-30 DIAGNOSIS — I252 Old myocardial infarction: Secondary | ICD-10-CM | POA: Diagnosis not present

## 2020-05-30 DIAGNOSIS — E785 Hyperlipidemia, unspecified: Secondary | ICD-10-CM | POA: Diagnosis not present

## 2020-05-30 DIAGNOSIS — Z79899 Other long term (current) drug therapy: Secondary | ICD-10-CM | POA: Diagnosis not present

## 2020-05-30 DIAGNOSIS — Z7902 Long term (current) use of antithrombotics/antiplatelets: Secondary | ICD-10-CM | POA: Diagnosis not present

## 2020-05-30 DIAGNOSIS — Z85850 Personal history of malignant neoplasm of thyroid: Secondary | ICD-10-CM | POA: Diagnosis not present

## 2020-05-30 DIAGNOSIS — I213 ST elevation (STEMI) myocardial infarction of unspecified site: Secondary | ICD-10-CM

## 2020-05-30 DIAGNOSIS — Z7984 Long term (current) use of oral hypoglycemic drugs: Secondary | ICD-10-CM | POA: Diagnosis not present

## 2020-05-30 NOTE — Progress Notes (Signed)
Daily Session Note  Patient Details  Name: Melissa Lamb MRN: [id number] Date of Birth: 1961-09-02 Referring Provider:     Cardiac Rehab from 04/11/2020 in [facility] Cardiac and Pulmonary Rehab  Referring Provider [facility]      Encounter Date: 05/30/2020  Check In:  Session Check In - 05/30/20 0924      Check-In   Supervising physician immediately available to respond to emergencies See telemetry face sheet for immediately available ER MD    Location [facility]-Cardiac & Pulmonary Rehab    Staff Present Heath Lark, RN, BSN, Laveda Norman, BS, ACSM CEP, Exercise Physiologist;Joseph Tessie Fass RCP,RRT,BSRT    Virtual Visit No    Medication changes reported     No    Fall or balance concerns reported    No    Warm-up and Cool-down Performed on first and last piece of equipment    Resistance Training Performed Yes    VAD Patient? No    PAD/SET Patient? No      Pain Assessment   Currently in Pain? No/denies              Social History   Tobacco Use  Smoking Status Never Smoker  Smokeless Tobacco Never Used    Goals Met:  Independence with exercise equipment Exercise tolerated well No report of cardiac concerns or symptoms  Goals Unmet:  Not Applicable  Comments: Pt able to follow exercise prescription today without complaint.  Will continue to monitor for progression.    Dr. Emily Filbert is Medical Director for [place] and [facility].

## 2020-06-01 ENCOUNTER — Encounter: Payer: Self-pay | Admitting: *Deleted

## 2020-06-01 DIAGNOSIS — I213 ST elevation (STEMI) myocardial infarction of unspecified site: Secondary | ICD-10-CM

## 2020-06-01 NOTE — Progress Notes (Signed)
Cardiac Individual Treatment Plan  Patient Details  Name: Melissa Lamb MRN: [id number] Date of Birth: [birth year] Referring Provider:     Cardiac Rehab from 04/11/2020 in [facility] Cardiac and Pulmonary Rehab  Referring Provider [facility]      Initial Encounter Date:    Cardiac Rehab from 04/11/2020 in [facility] Cardiac and Pulmonary Rehab  Date 04/11/20      Visit Diagnosis: ST elevation myocardial infarction (STEMI), unspecified artery ([place])  Patient's Home Medications on Admission:  Current Outpatient Medications:    aspirin 81 MG chewable tablet, Chew 1 tablet (81 mg total) by mouth daily. Need to take for at least a year., Disp: , Rfl:    Cholecalciferol (VITAMIN D-1000 MAX ST) 25 MCG (1000 UT) tablet, Take 2,000 Units by mouth daily. , Disp: , Rfl:    fenofibrate 160 MG tablet, Take 1 tablet (160 mg total) by mouth daily., Disp: 90 tablet, Rfl: 2   glipiZIDE (GLUCOTROL) 5 MG tablet, Take 1.5 tablets (7.5 mg total) by mouth daily., Disp: 45 tablet, Rfl: 5   levothyroxine (SYNTHROID) 88 MCG tablet, Take 88 mcg by mouth daily. , Disp: , Rfl:    metFORMIN (GLUCOPHAGE) 500 MG tablet, Take 1 tablet (500 mg total) by mouth 2 (two) times daily., Disp: 60 tablet, Rfl: 5   metoprolol succinate (TOPROL-XL) 50 MG 24 hr tablet, Take 1 tablet (50 mg total) by mouth daily. Take with or immediately following a meal., Disp: 30 tablet, Rfl: 0   rosuvastatin (CRESTOR) 40 MG tablet, Take 1 tablet (40 mg total) by mouth daily., Disp: 30 tablet, Rfl: 2   ticagrelor (BRILINTA) 90 MG TABS tablet, Take 1 tablet (90 mg total) by mouth 2 (two) times daily. Need to take this for at least a year., Disp: 60 tablet, Rfl: 11  Past Medical History: Past Medical History:  Diagnosis Date   Diabetes mellitus without complication ([place])    History of chickenpox    Hyperlipidemia    Hypertension    Thyroid cancer ([place])     Tobacco Use: Social History   Tobacco Use  Smoking Status Never Smoker   Smokeless Tobacco Never Used    Labs: Recent Review Flowsheet Data    Labs for ITP Cardiac and Pulmonary Rehab Latest Ref Rng & Units 07/10/2018 10/20/2019 02/22/2020 04/01/2020 04/02/2020   Cholestrol 0 - 200 mg/dL 250(A) - - - 182   LDLCALC 0 - 99 mg/dL 144 - - - 96   HDL >40 mg/dL 31(A) - - - 29(L)   Trlycerides <150 mg/dL 375(A) - - - 284(H)   Hemoglobin A1c 4.8 - 5.6 % 6.0 5.8(A) 6.1(A) 6.0(H) -       Exercise Target Goals: Exercise Program Goal: Individual exercise prescription set using results from initial 6 min walk test and THRR while considering  patients activity barriers and safety.   Exercise Prescription Goal: Initial exercise prescription builds to 30-45 minutes a day of aerobic activity, 2-3 days per week.  Home exercise guidelines will be given to patient during program as part of exercise prescription that the participant will acknowledge.   Education: Aerobic Exercise & Resistance Training: - Gives group verbal and written instruction on the various components of exercise. Focuses on aerobic and resistive training programs and the benefits of this training and how to safely progress through these programs..   Education: Exercise & Equipment Safety: - Individual verbal instruction and demonstration of equipment use and safety with use of the equipment.   Cardiac Rehab from 04/11/2020 in  [place] Cardiac and Pulmonary Rehab  Date 04/11/20  Educator AS  Instruction Review Code 1- Verbalizes Understanding      Education: Exercise Physiology & General Exercise Guidelines: - Group verbal and written instruction with models to review the exercise physiology of the cardiovascular system and associated critical values. Provides general exercise guidelines with specific guidelines to those with heart or lung disease.    Education: Flexibility, Balance, Mind/Body Relaxation: Provides group verbal/written instruction on the benefits of flexibility and balance training,  including mind/body exercise modes such as yoga, pilates and tai chi.  Demonstration and skill practice provided.   Activity Barriers & Risk Stratification:   6 Minute Walk:  6 Minute Walk    Row Name 04/11/20 1436         6 Minute Walk   Phase Initial     Distance 1300 feet     Walk Time 6 minutes     # of Rest Breaks 0     MPH 2.46     METS 3.99     RPE 11     Perceived Dyspnea  1     VO2 Peak 13.96     Symptoms No     Resting HR 78 bpm     Resting BP 118/64     Resting Oxygen Saturation  98 %     Exercise Oxygen Saturation  during 6 min walk 100 %     Max Ex. HR 115 bpm     Max Ex. BP 130/62     2 Minute Post BP 122/64            Oxygen Initial Assessment:   Oxygen Re-Evaluation:   Oxygen Discharge (Final Oxygen Re-Evaluation):   Initial Exercise Prescription:  Initial Exercise Prescription - 04/11/20 1400      Date of Initial Exercise RX and Referring Provider   Date 04/11/20    Referring Provider [facility]      Treadmill   MPH 2.5    Grade 3    Minutes 15    METs 4      Recumbant Bike   Level 3    RPM 60    Watts 41    Minutes 15    METs 4      NuStep   Level 3    SPM 80    Minutes 15    METs 4      REL-XR   Level 4    Speed 50    Minutes 15    METs 4      T5 Nustep   Level 2    SPM 80    Minutes 15    METs 4      Prescription Details   Frequency (times per week) 3      Intensity   THRR 40-80% of Max Heartrate 111-144    Ratings of Perceived Exertion 11-15    Perceived Dyspnea 0-4      Resistance Training   Training Prescription Yes    Weight 3 lb    Reps 10-15           Perform Capillary Blood Glucose checks as needed.  Exercise Prescription Changes:  Exercise Prescription Changes    Row Name 04/11/20 1400 04/25/20 1600 05/10/20 1400 05/24/20 1400       Response to Exercise   Blood Pressure (Admit) 118/64 116/72 102/60 126/62    Blood Pressure (Exercise) 130/62 128/70 130/70 134/68    Blood Pressure  (Exit) 122/64  120/62 122/70 120/60    Heart Rate (Admit) 78 bpm 76 bpm 71 bpm 75 bpm    Heart Rate (Exercise) 115 bpm 112 bpm 113 bpm 113 bpm    Heart Rate (Exit) 70 bpm 85 bpm 77 bpm 78 bpm    Oxygen Saturation (Admit) 98 % -- -- --    Oxygen Saturation (Exercise) 100 % -- -- --    Rating of Perceived Exertion (Exercise) _0 Perceived Dyspnea (Exercise) 1 -- -- --    Symptoms none none none none    Comments -- third full day of exercise -- --    Duration -- Continue with 30 min of aerobic exercise without signs/symptoms of physical distress. Continue with 30 min of aerobic exercise without signs/symptoms of physical distress. Continue with 30 min of aerobic exercise without signs/symptoms of physical distress.    Intensity -- THRR unchanged THRR unchanged THRR unchanged      Progression   Progression -- Continue to progress workloads to maintain intensity without signs/symptoms of physical distress. Continue to progress workloads to maintain intensity without signs/symptoms of physical distress. Continue to progress workloads to maintain intensity without signs/symptoms of physical distress.    Average METs -- 3.95 4.2 3.76      Resistance Training   Training Prescription -- Yes Yes Yes    Weight -- 3 lb 3 lb 3 lb    Reps -- 10-15 10-15 10-15      Interval Training   Interval Training -- No No No      Treadmill   MPH -- 2._1 Grade -- 3 3.5 3.5    Minutes -- _2 METs -- 4.3 4.3 3.75      Elliptical   Level -- -- -- 2    Speed -- -- -- 2.3    Minutes -- -- -- 15    METs -- -- -- 2.6      REL-XR   Level -- _3 Speed -- -- 50 --    Minutes -- _4 METs -- 3.6 4.1 3.8           Exercise Comments:  Exercise Comments    Row Name 04/15/20 1118           Exercise Comments First full day of exercise!  Patient was oriented to gym and equipment including functions, settings, policies, and procedures.  Patient's individual exercise  prescription and treatment plan were reviewed.  All starting workloads were established based on the results of the 6 minute walk test done at initial orientation visit.  The plan for exercise progression was also introduced and progression will be customized based on patient's performance and goals.              Exercise Goals and Review:  Exercise Goals    Row Name 04/11/20 1442             Exercise Goals   Increase Physical Activity Yes       Intervention Provide advice, education, support and counseling about physical activity/exercise needs.;Develop an individualized exercise prescription for aerobic and resistive training based on initial evaluation findings, risk stratification, comorbidities and participant's personal goals.       Expected Outcomes Short Term: Attend rehab on a regular basis to increase amount of physical activity.;Long Term: Add in home exercise to make exercise part of routine and  to increase amount of physical activity.;Long Term: Exercising regularly at least 3-5 days a week.       Increase Strength and Stamina Yes       Intervention Provide advice, education, support and counseling about physical activity/exercise needs.;Develop an individualized exercise prescription for aerobic and resistive training based on initial evaluation findings, risk stratification, comorbidities and participant's personal goals.       Expected Outcomes Short Term: Increase workloads from initial exercise prescription for resistance, speed, and METs.;Short Term: Perform resistance training exercises routinely during rehab and add in resistance training at home;Long Term: Improve cardiorespiratory fitness, muscular endurance and strength as measured by increased METs and functional capacity (6MWT)       Able to understand and use rate of perceived exertion (RPE) scale Yes       Intervention Provide education and explanation on how to use RPE scale       Expected Outcomes Short Term: Able  to use RPE daily in rehab to express subjective intensity level;Long Term:  Able to use RPE to guide intensity level when exercising independently       Knowledge and understanding of Target Heart Rate Range (THRR) Yes       Intervention Provide education and explanation of THRR including how the numbers were predicted and where they are located for reference       Expected Outcomes Short Term: Able to state/look up THRR;Short Term: Able to use daily as guideline for intensity in rehab;Long Term: Able to use THRR to govern intensity when exercising independently       Able to check pulse independently Yes       Intervention Provide education and demonstration on how to check pulse in carotid and radial arteries.;Review the importance of being able to check your own pulse for safety during independent exercise       Expected Outcomes Short Term: Able to explain why pulse checking is important during independent exercise;Long Term: Able to check pulse independently and accurately       Understanding of Exercise Prescription Yes       Intervention Provide education, explanation, and written materials on patient's individual exercise prescription       Expected Outcomes Short Term: Able to explain program exercise prescription;Long Term: Able to explain home exercise prescription to exercise independently              Exercise Goals Re-Evaluation :  Exercise Goals Re-Evaluation    Row Name 04/15/20 1119 04/25/20 1600 05/10/20 1431 05/18/20 0934 05/24/20 1407     Exercise Goal Re-Evaluation   Exercise Goals Review Knowledge and understanding of Target Heart Rate Range (THRR);Able to understand and use rate of perceived exertion (RPE) scale;Understanding of Exercise Prescription Increase Strength and Stamina;Increase Physical Activity;Understanding of Exercise Prescription Increase Strength and Stamina;Increase Physical Activity;Understanding of Exercise Prescription Increase Strength and  Stamina;Increase Physical Activity;Understanding of Exercise Prescription Increase Strength and Stamina;Increase Physical Activity;Understanding of Exercise Prescription   Comments Reviewed RPE and dyspnea scales, THR and program prescription with pt today.  Pt voiced understanding and was given a copy of goals to take home. Janetta is off to a good start in rehab.  She has completed her first three full days of exercise and is already up to 2.8 mph on the treadmill.  We will continue to monitor her progress. Sawyer has increased workloads on TM and XR.  She reaches THR range on TM, but needs to increase level on XR.  Staff will monitor progress.  Tiffanee has been doing well. She is walking at home on her off days, 30-45 minutes, each time and an extra 2x/week,  reports no issues. She also uses handweights at home, although states she does not do it as much as she would like to. Encouraged to incorporate more hand weights along with her endurance exercise. Continues to take her BP and HR at home and reports normal ranges. Taimi is doing well in ehab.  She is up to level 2 on the ellipitcal now!  We will conitnue to monitor her progress.   Expected Outcomes Short: Use RPE daily to regulate intensity. Long: Follow program prescription in THR. Short: Continue to attend rehab regularly Long: Continue to follow program prescription. Short: Continue to attend rehab regularly Long: Continue to follow program prescription. Short: Continue to incorporate more hand weights Long: Increase strength/stamina, progress overall MET level Short: Talk about adding in intervals on seated equipment Long; Continue to improve stamina.          Discharge Exercise Prescription (Final Exercise Prescription Changes):  Exercise Prescription Changes - 05/24/20 1400      Response to Exercise   Blood Pressure (Admit) 126/62    Blood Pressure (Exercise) 134/68    Blood Pressure (Exit) 120/60    Heart Rate (Admit) 75 bpm    Heart  Rate (Exercise) 113 bpm    Heart Rate (Exit) 78 bpm    Rating of Perceived Exertion (Exercise) 13    Symptoms none    Duration Continue with 30 min of aerobic exercise without signs/symptoms of physical distress.    Intensity THRR unchanged      Progression   Progression Continue to progress workloads to maintain intensity without signs/symptoms of physical distress.    Average METs 3.76      Resistance Training   Training Prescription Yes    Weight 3 lb    Reps 10-15      Interval Training   Interval Training No      Treadmill   MPH 3    Grade 3.5    Minutes 15    METs 3.75      Elliptical   Level 2    Speed 2.3    Minutes 15    METs 2.6      REL-XR   Level 4    Minutes 15    METs 3.8           Nutrition:  Target Goals: Understanding of nutrition guidelines, daily intake of sodium <1554m, cholesterol <2069m calories 30% from fat and 7% or less from saturated fats, daily to have 5 or more servings of fruits and vegetables.  Education: Controlling Sodium/Reading Food Labels -Group verbal and written material supporting the discussion of sodium use in heart healthy nutrition. Review and explanation with models, verbal and written materials for utilization of the food label.   Education: General Nutrition Guidelines/Fats and Fiber: -Group instruction provided by verbal, written material, models and posters to present the general guidelines for heart healthy nutrition. Gives an explanation and review of dietary fats and fiber.   Biometrics:  Pre Biometrics - 04/11/20 1448      Pre Biometrics   Height _0  (1.727 m)    Weight 143 lb 14.4 oz (65.3 kg)    BMI (Calculated) 21.89    Single Leg Stand 30 seconds            Nutrition Therapy Plan and Nutrition Goals:   Nutrition Assessments:  Nutrition Assessments -  04/11/20 1451      MEDFICTS Scores   Pre Score 18           MEDIFICTS Score Key:          ?70 Need to make dietary changes           40-70 Heart Healthy Diet         ? 40 Therapeutic Level Cholesterol Diet  Nutrition Goals Re-Evaluation:  Nutrition Goals Re-Evaluation    [place] Name 05/18/20 0954             Goals   Current Weight 141 lb 3.2 oz (64 kg)       Comment Dailah has not met with Melissa, our RD as of yet. Patient is interested in speaking with her and will plan to set up a tine for the future,       Expected Outcome Short: Meet with RD to discuss nutrition goals Long: Maintain eating a heart healthy diet              Nutrition Goals Discharge (Final Nutrition Goals Re-Evaluation):  Nutrition Goals Re-Evaluation - 05/18/20 0954      Goals   Current Weight 141 lb 3.2 oz (64 kg)    Comment Vernadette has not met with Lenna Sciara, our RD as of yet. Patient is interested in speaking with her and will plan to set up a tine for the future,    Expected Outcome Short: Meet with RD to discuss nutrition goals Long: Maintain eating a heart healthy diet           Psychosocial: Target Goals: Acknowledge presence or absence of significant depression and/or stress, maximize coping skills, provide positive support system. Participant is able to verbalize types and ability to use techniques and skills needed for reducing stress and depression.   Education: Depression - Provides group verbal and written instruction on the correlation between heart/lung disease and depressed mood, treatment options, and the stigmas associated with seeking treatment.   Education: Sleep Hygiene -Provides group verbal and written instruction about how sleep can affect your health.  Define sleep hygiene, discuss sleep cycles and impact of sleep habits. Review good sleep hygiene tips.     Education: Stress and Anxiety: - Provides group verbal and written instruction about the health risks of elevated stress and causes of high stress.  Discuss the correlation between heart/lung disease and anxiety and treatment options. Review healthy ways to  manage with stress and anxiety.    Initial Review & Psychosocial Screening:  Initial Psych Review & Screening - 04/07/20 1331      Initial Review   Current issues with None Identified      Family Dynamics   Good Support System? Yes    Comments She can look to her children, daughter, mom and dad for support. Overall she is a positive person but she her husband had passed away last year.      Barriers   Psychosocial barriers to participate in program There are no identifiable barriers or psychosocial needs.;The patient should benefit from training in stress management and relaxation.      Screening Interventions   Interventions Encouraged to exercise;Program counselor consult;Provide feedback about the scores to participant;To provide support and resources with identified psychosocial needs    Expected Outcomes Short Term goal: Utilizing psychosocial counselor, staff and physician to assist with identification of specific Stressors or current issues interfering with healing process. Setting desired goal for each stressor or current issue identified.;Long Term Goal:  Stressors or current issues are controlled or eliminated.;Short Term goal: Identification and review with participant of any Quality of Life or Depression concerns found by scoring the questionnaire.;Long Term goal: The participant improves quality of Life and PHQ9 Scores as seen by post scores and/or verbalization of changes           Quality of Life Scores:   Quality of Life - 04/11/20 1449      Quality of Life   Select Quality of Life      Quality of Life Scores   Health/Function Pre 22.39 %    Socioeconomic Pre 22.5 %    Psych/Spiritual Pre 21.71 %    Family Pre 25.5 %          Scores of 19 and below usually indicate a poorer quality of life in these areas.  A difference of  2-3 points is a clinically meaningful difference.  A difference of 2-3 points in the total score of the Quality of Life Index has been  associated with significant improvement in overall quality of life, self-image, physical symptoms, and general health in studies assessing change in quality of life.  PHQ-9: Recent Review Flowsheet Data    Depression screen [facility] 2/9 04/11/2020 10/20/2019   Decreased Interest 0 0   Down, Depressed, Hopeless 0 0   PHQ - 2 Score 0 0   Altered sleeping 1 -   Tired, decreased energy 1 -   Change in appetite 0 -   Feeling bad or failure about yourself  0 -   Trouble concentrating 0 -   Moving slowly or fidgety/restless 0 -   Suicidal thoughts 0 -   PHQ-9 Score 2 -   Difficult doing work/chores Not difficult at all -     Interpretation of Total Score  Total Score Depression Severity:  1-4 = Minimal depression, 5-9 = Mild depression, 10-14 = Moderate depression, 15-19 = Moderately severe depression, 20-27 = Severe depression   Psychosocial Evaluation and Intervention:  Psychosocial Evaluation - 04/07/20 1333      Psychosocial Evaluation & Interventions   Interventions Encouraged to exercise with the program and follow exercise prescription    Comments She can look to her children, daughter, mom and dad for support. Overall she is a positive person but she her husband had passed away last year.    Expected Outcomes Short: start Cardiac rehab to improve mood. Long: keep stress at a minimum.    Continue Psychosocial Services  Follow up required by staff           Psychosocial Re-Evaluation:  Psychosocial Re-Evaluation    [place] Name 05/18/20 [phone]             Psychosocial Re-Evaluation   Current issues with Current Stress Concerns       Comments Rosena reports she has been holding up mentally very well.  She keeps busy gardening and has great support from her kids/family. She enjoys giving herself projects to do to stay busy. Gean is maintaining a positive attitude and is hopeful for a good year.       Expected Outcomes Short: Continue to exercise for mental health benefits Long:  Maintain positive attitude, avoid big stressors       Continue Psychosocial Services  Follow up required by staff              Psychosocial Discharge (Final Psychosocial Re-Evaluation):  Psychosocial Re-Evaluation - 05/18/20 0948      Psychosocial Re-Evaluation   Current  issues with Current Stress Concerns    Comments Lavra reports she has been holding up mentally very well.  She keeps busy gardening and has great support from her kids/family. She enjoys giving herself projects to do to stay busy. Amzie is maintaining a positive attitude and is hopeful for a good year.    Expected Outcomes Short: Continue to exercise for mental health benefits Long: Maintain positive attitude, avoid big stressors    Continue Psychosocial Services  Follow up required by staff           Vocational Rehabilitation: Provide vocational rehab assistance to qualifying candidates.   Vocational Rehab Evaluation & Intervention:   Education: Education Goals: Education classes will be provided on a variety of topics geared toward better understanding of heart health and risk factor modification. Participant will state understanding/return demonstration of topics presented as noted by education test scores.  Learning Barriers/Preferences:  Learning Barriers/Preferences - 04/07/20 1334      Learning Barriers/Preferences   Learning Barriers None    Learning Preferences None           General Cardiac Education Topics:  AED/CPR: - Group verbal and written instruction with the use of models to demonstrate the basic use of the AED with the basic ABC's of resuscitation.   Anatomy & Physiology of the Heart: - Group verbal and written instruction and models provide basic cardiac anatomy and physiology, with the coronary electrical and arterial systems. Review of Valvular disease and Heart Failure   Cardiac Procedures: - Group verbal and written instruction to review commonly prescribed medications for  heart disease. Reviews the medication, class of the drug, and side effects. Includes the steps to properly store meds and maintain the prescription regimen. (beta blockers and nitrates)   Cardiac Medications I: - Group verbal and written instruction to review commonly prescribed medications for heart disease. Reviews the medication, class of the drug, and side effects. Includes the steps to properly store meds and maintain the prescription regimen.   Cardiac Medications II: -Group verbal and written instruction to review commonly prescribed medications for heart disease. Reviews the medication, class of the drug, and side effects. (all other drug classes)    Go Sex-Intimacy & Heart Disease, Get SMART - Goal Setting: - Group verbal and written instruction through game format to discuss heart disease and the return to sexual intimacy. Provides group verbal and written material to discuss and apply goal setting through the application of the S.M.A.R.T. Method.   Other Matters of the Heart: - Provides group verbal, written materials and models to describe Stable Angina and Peripheral Artery. Includes description of the disease process and treatment options available to the cardiac patient.   Infection Prevention: - Provides verbal and written material to individual with discussion of infection control including proper hand washing and proper equipment cleaning during exercise session.   Cardiac Rehab from 04/11/2020 in [facility] Cardiac and Pulmonary Rehab  Date 04/11/20  Educator AS  Instruction Review Code 1- Verbalizes Understanding      Falls Prevention: - Provides verbal and written material to individual with discussion of falls prevention and safety.   Cardiac Rehab from 04/11/2020 in [facility] Cardiac and Pulmonary Rehab  Date 04/11/20  Educator AS  Instruction Review Code 1- Verbalizes Understanding      Other: -Provides group and verbal instruction on various topics (see  comments)   Knowledge Questionnaire Score:  Knowledge Questionnaire Score - 04/11/20 1449      Knowledge Questionnaire Score  Pre Score 25/26           Core Components/Risk Factors/Patient Goals at Admission:  Personal Goals and Risk Factors at Admission - 04/11/20 1452      Core Components/Risk Factors/Patient Goals on Admission    Weight Management Yes    Intervention Weight Management: Develop a combined nutrition and exercise program designed to reach desired caloric intake, while maintaining appropriate intake of nutrient and fiber, sodium and fats, and appropriate energy expenditure required for the weight goal.;Weight Management: Provide education and appropriate resources to help participant work on and attain dietary goals.;Weight Management/Obesity: Establish reasonable short term and long term weight goals.    Admit Weight 143 lb 14.4 oz (65.3 kg)    Goal Weight: Short Term 143 lb (64.9 kg)    Goal Weight: Long Term 143 lb (64.9 kg)    Expected Outcomes Weight Maintenance: Understanding of the daily nutrition guidelines, which includes 25-35% calories from fat, 7% or less cal from saturated fats, less than 2106m cholesterol, less than 1.5gm of sodium, & 5 or more servings of fruits and vegetables daily;Long Term: Adherence to nutrition and physical activity/exercise program aimed toward attainment of established weight goal;Short Term: Continue to assess and modify interventions until short term weight is achieved;Understanding recommendations for meals to include 15-35% energy as protein, 25-35% energy from fat, 35-60% energy from carbohydrates, less than 2023mof dietary cholesterol, 20-35 gm of total fiber daily;Understanding of distribution of calorie intake throughout the day with the consumption of 4-5 meals/snacks    Diabetes Yes    Intervention Provide education about signs/symptoms and action to take for hypo/hyperglycemia.;Provide education about proper nutrition,  including hydration, and aerobic/resistive exercise prescription along with prescribed medications to achieve blood glucose in normal ranges: Fasting glucose 65-99 mg/dL    Expected Outcomes Short Term: Participant verbalizes understanding of the signs/symptoms and immediate care of hyper/hypoglycemia, proper foot care and importance of medication, aerobic/resistive exercise and nutrition plan for blood glucose control.;Long Term: Attainment of HbA1C < 7%.    Intervention Provide education on lifestyle modifcations including regular physical activity/exercise, weight management, moderate sodium restriction and increased consumption of fresh fruit, vegetables, and low fat dairy, alcohol moderation, and smoking cessation.;Monitor prescription use compliance.    Expected Outcomes Short Term: Continued assessment and intervention until BP is < 140/9043mG in hypertensive participants. < 130/61m79m in hypertensive participants with diabetes, heart failure or chronic kidney disease.;Long Term: Maintenance of blood pressure at goal levels.    Intervention Provide education and support for participant on nutrition & aerobic/resistive exercise along with prescribed medications to achieve LDL <70mg58mL >40mg.30mExpected Outcomes Short Term: Participant states understanding of desired cholesterol values and is compliant with medications prescribed. Participant is following exercise prescription and nutrition guidelines.;Long Term: Cholesterol controlled with medications as prescribed, with individualized exercise RX and with personalized nutrition plan. Value goals: LDL < 70mg, 76m> 40 mg.           Education:Diabetes - Individual verbal and written instruction to review signs/symptoms of diabetes, desired ranges of glucose level fasting, after meals and with exercise. Acknowledge that pre and post exercise glucose checks will be done for 3 sessions at entry of program.   [facility]  Date 04/07/20  Educator JH  [facility] Review Code 1- Verbalizes Understanding      Education: Know Your Numbers and Risk Factors: -Group verbal and written instruction about important numbers in  your health.  Discussion of what are risk factors and how they play a role in the disease process.  Review of Cholesterol, Blood Pressure, Diabetes, and BMI and the role they play in your overall health.   Core Components/Risk Factors/Patient Goals Review:   Goals and Risk Factor Review    Row Name 05/18/20 0941             Core Components/Risk Factors/Patient Goals Review   Personal Goals Review Stress;Diabetes;Hypertension;Lipids       Review Chania's BP's have been stable, ranging in about 703 for systolic. Kalynn says her weight has been maintaining and she is focusing on eating a heart healthy diet, She still has the big stress of the passing of her husband which happened recently this past year. She has great support from her son and daughter and other family. Continues to maintain normal blood glucose levels and monitors it at home. She is due to get an updated lipid panel at the end of July.       Expected Outcomes Short: Focus on stress management, continue regular attendance to rehab Long: Manage risk factors              Core Components/Risk Factors/Patient Goals at Discharge (Final Review):   Goals and Risk Factor Review - 05/18/20 0941      Core Components/Risk Factors/Patient Goals Review   Personal Goals Review Stress;Diabetes;Hypertension;Lipids    Review Artice's BP's have been stable, ranging in about 500 for systolic. Lakeria says her weight has been maintaining and she is focusing on eating a heart healthy diet, She still has the big stress of the passing of her husband which happened recently this past year. She has great support from her son and daughter and other family. Continues to maintain normal blood glucose levels and monitors it at  home. She is due to get an updated lipid panel at the end of July.    Expected Outcomes Short: Focus on stress management, continue regular attendance to rehab Long: Manage risk factors           ITP Comments:  ITP Comments    Row Name 04/07/20 1337 04/11/20 1455 04/15/20 1117 05/04/20 0643 06/01/20 1107   ITP Comments Virtual Visit completed. Patient informed on EP and RD appointment and 6 Minute walk test. Patient also informed of patient health questionnaires on My Chart. Patient Verbalizes understanding. Visit diagnosis can be found in [facility]. Completed 6MWT and gym orientation.  Initial ITP created and sent for review to Dr. Emily Filbert, Medical Director. First full day of exercise!  Patient was oriented to gym and equipment including functions, settings, policies, and procedures.  Patient's individual exercise prescription and treatment plan were reviewed.  All starting workloads were established based on the results of the 6 minute walk test done at initial orientation visit.  The plan for exercise progression was also introduced and progression will be customized based on patient's performance and goals. 30 Day review completed. Medical Director ITP review done, changes made as directed, and signed approval by Medical Director. 30 Day review completed. Medical Director ITP review done, changes made as directed, and signed approval by Medical Director.          Comments:

## 2020-06-03 ENCOUNTER — Other Ambulatory Visit: Payer: Self-pay

## 2020-06-03 DIAGNOSIS — Z85850 Personal history of malignant neoplasm of thyroid: Secondary | ICD-10-CM | POA: Diagnosis not present

## 2020-06-03 DIAGNOSIS — Z7902 Long term (current) use of antithrombotics/antiplatelets: Secondary | ICD-10-CM | POA: Diagnosis not present

## 2020-06-03 DIAGNOSIS — Z79899 Other long term (current) drug therapy: Secondary | ICD-10-CM | POA: Diagnosis not present

## 2020-06-03 DIAGNOSIS — Z79890 Hormone replacement therapy (postmenopausal): Secondary | ICD-10-CM | POA: Diagnosis not present

## 2020-06-03 DIAGNOSIS — I10 Essential (primary) hypertension: Secondary | ICD-10-CM | POA: Diagnosis not present

## 2020-06-03 DIAGNOSIS — E785 Hyperlipidemia, unspecified: Secondary | ICD-10-CM | POA: Diagnosis not present

## 2020-06-03 DIAGNOSIS — Z7984 Long term (current) use of oral hypoglycemic drugs: Secondary | ICD-10-CM | POA: Diagnosis not present

## 2020-06-03 DIAGNOSIS — I213 ST elevation (STEMI) myocardial infarction of unspecified site: Secondary | ICD-10-CM

## 2020-06-03 DIAGNOSIS — Z7982 Long term (current) use of aspirin: Secondary | ICD-10-CM | POA: Diagnosis not present

## 2020-06-03 DIAGNOSIS — E119 Type 2 diabetes mellitus without complications: Secondary | ICD-10-CM | POA: Diagnosis not present

## 2020-06-03 DIAGNOSIS — I252 Old myocardial infarction: Secondary | ICD-10-CM | POA: Diagnosis not present

## 2020-06-03 NOTE — Progress Notes (Signed)
Daily Session Note  Patient Details  Name: Melissa Lamb MRN: [id number] Date of Birth: 07-Jan-1961 Referring Provider:     Cardiac Rehab from 04/11/2020 in [facility] Cardiac and Pulmonary Rehab  Referring Provider [facility]      Encounter Date: 06/03/2020  Check In:  Session Check In - 06/03/20 0921      Check-In   Supervising physician immediately available to respond to emergencies See telemetry face sheet for immediately available ER MD    Location [facility]-Cardiac & Pulmonary Rehab    Staff Present Basilia Jumbo, RN, BSN;Jessica Luan Pulling, MA, RCEP, CCRP, CCET;Meredith [place], RN BSN;Joseph [facility]    Virtual Visit No    Medication changes reported     No    Tobacco Cessation No Change    Warm-up and Cool-down Performed on first and last piece of equipment    Resistance Training Performed Yes    VAD Patient? No    PAD/SET Patient? No      Pain Assessment   Currently in Pain? No/denies              Social History   Tobacco Use  Smoking Status Never Smoker  Smokeless Tobacco Never Used    Goals Met:  Independence with exercise equipment Exercise tolerated well No report of cardiac concerns or symptoms  Goals Unmet:  Not Applicable  Comments: Pt able to follow exercise prescription today without complaint.  Will continue to monitor for progression.    Dr. Emily Filbert is Medical Director for [place] and [facility].

## 2020-06-06 ENCOUNTER — Encounter: Payer: Self-pay | Admitting: Family Medicine

## 2020-06-06 ENCOUNTER — Encounter: Payer: BC Managed Care – PPO | Admitting: *Deleted

## 2020-06-06 ENCOUNTER — Other Ambulatory Visit: Payer: Self-pay

## 2020-06-06 DIAGNOSIS — Z7984 Long term (current) use of oral hypoglycemic drugs: Secondary | ICD-10-CM | POA: Diagnosis not present

## 2020-06-06 DIAGNOSIS — Z85850 Personal history of malignant neoplasm of thyroid: Secondary | ICD-10-CM | POA: Diagnosis not present

## 2020-06-06 DIAGNOSIS — Z7982 Long term (current) use of aspirin: Secondary | ICD-10-CM | POA: Diagnosis not present

## 2020-06-06 DIAGNOSIS — Z79899 Other long term (current) drug therapy: Secondary | ICD-10-CM | POA: Diagnosis not present

## 2020-06-06 DIAGNOSIS — E785 Hyperlipidemia, unspecified: Secondary | ICD-10-CM | POA: Diagnosis not present

## 2020-06-06 DIAGNOSIS — Z79890 Hormone replacement therapy (postmenopausal): Secondary | ICD-10-CM | POA: Diagnosis not present

## 2020-06-06 DIAGNOSIS — I252 Old myocardial infarction: Secondary | ICD-10-CM | POA: Diagnosis not present

## 2020-06-06 DIAGNOSIS — I213 ST elevation (STEMI) myocardial infarction of unspecified site: Secondary | ICD-10-CM

## 2020-06-06 DIAGNOSIS — I10 Essential (primary) hypertension: Secondary | ICD-10-CM | POA: Diagnosis not present

## 2020-06-06 DIAGNOSIS — E119 Type 2 diabetes mellitus without complications: Secondary | ICD-10-CM | POA: Diagnosis not present

## 2020-06-06 DIAGNOSIS — Z7902 Long term (current) use of antithrombotics/antiplatelets: Secondary | ICD-10-CM | POA: Diagnosis not present

## 2020-06-06 NOTE — Progress Notes (Signed)
Daily Session Note  Patient Details  Name: Melissa Lamb MRN: [id number] Date of Birth: 23-Nov-1960 Referring Provider:     Cardiac Rehab from 04/11/2020 in [facility] Cardiac and Pulmonary Rehab  Referring Provider [facility]      Encounter Date: 06/06/2020  Check In:  Session Check In - 06/06/20 0931      Check-In   Supervising physician immediately available to respond to emergencies See telemetry face sheet for immediately available ER MD    Location [facility]-Cardiac & Pulmonary Rehab    Staff Present Nyoka Cowden, RN, BSN, [place], BS, ACSM CEP, Exercise Physiologist;Joseph [facility] [employer] [facility] [place], [place], RCEP, CCRP, CCET    Virtual Visit No    Medication changes reported     No    Fall or balance concerns reported    No    Tobacco Cessation No Change    Warm-up and Cool-down Performed on first and last piece of equipment    Resistance Training Performed Yes    VAD Patient? No    PAD/SET Patient? No      Pain Assessment   Currently in Pain? No/denies              Social History   Tobacco Use  Smoking Status Never Smoker  Smokeless Tobacco Never Used    Goals Met:  Independence with exercise equipment Exercise tolerated well No report of cardiac concerns or symptoms Strength training completed today  Goals Unmet:  Not Applicable  Comments: Pt able to follow exercise prescription today without complaint.  Will continue to monitor for progression.   Dr. Emily Filbert is Medical Director for [place] and [facility].

## 2020-06-07 ENCOUNTER — Ambulatory Visit (INDEPENDENT_AMBULATORY_CARE_PROVIDER_SITE_OTHER): Payer: BC Managed Care – PPO | Admitting: Family Medicine

## 2020-06-07 ENCOUNTER — Other Ambulatory Visit: Payer: Self-pay

## 2020-06-07 ENCOUNTER — Encounter: Payer: Self-pay | Admitting: Family Medicine

## 2020-06-07 VITALS — BP 150/80 | HR 71 | Temp 98.4°F | Wt 142.5 lb

## 2020-06-07 DIAGNOSIS — Z1211 Encounter for screening for malignant neoplasm of colon: Secondary | ICD-10-CM

## 2020-06-07 DIAGNOSIS — H6191 Disorder of right external ear, unspecified: Secondary | ICD-10-CM | POA: Diagnosis not present

## 2020-06-07 DIAGNOSIS — H60331 Swimmer's ear, right ear: Secondary | ICD-10-CM | POA: Insufficient documentation

## 2020-06-07 NOTE — Assessment & Plan Note (Signed)
Pt reports hx of recurrent otitis externa requiring cleaning. She has drops at home which she will start and if not improve with do oral abx. Given chronic history referral to ENT now for follow-up treatment and ongoing care.

## 2020-06-07 NOTE — Progress Notes (Signed)
Subjective:     Melissa Lamb is a 59 y.o. female presenting for Ear Fullness (Right ear: has had clear and bloody discharge for a length of time )     HPI   # Ear fullness - since 2017  - chronic issue - started after the iodine treatment - had dry eyes, receeding gums - has been to ENT - will get it cleaned out and check the pressure and told it looks good - not painful, but feels full - will leak blood or clear fluid - ears started bleeding after her heart attack - sleeping is hard because the fluid will cause a sucking noise and fluid - a little blood on the pillow but not having extensive drainage  Skin lesion on ear - has tried several creams and  - has been using the oils since the bleeding   Review of Systems   Social History   Tobacco Use  Smoking Status Never Smoker  Smokeless Tobacco Never Used        Objective:    BP Readings from Last 3 Encounters:  06/07/20 (!) 150/80  04/18/20 122/67  04/06/20 (!) 148/92   Wt Readings from Last 3 Encounters:  06/07/20 142 lb 8 oz (64.6 kg)  04/18/20 143 lb 14.4 oz (65.3 kg)  04/11/20 143 lb 14.4 oz (65.3 kg)    BP (!) 150/80   Pulse 71   Temp 98.4 F (36.9 C) (Temporal)   Wt 142 lb 8 oz (64.6 kg)   SpO2 97%   BMI 21.67 kg/m    Physical Exam Constitutional:      General: She is not in acute distress.    Appearance: She is well-developed. She is not diaphoretic.  HENT:     Left Ear: Tympanic membrane, ear canal and external ear normal.     Ears:     Comments: Unable to view the right TM due to purulent drainage. Canal with erythema. External right ear with small erythematous lesion with dry skin.  Eyes:     Conjunctiva/sclera: Conjunctivae normal.  Cardiovascular:     Rate and Rhythm: Normal rate.  Pulmonary:     Effort: Pulmonary effort is normal.  Musculoskeletal:     Cervical back: Neck supple.  Skin:    General: Skin is warm and dry.     Capillary Refill: Capillary refill takes  less than 2 seconds.  Neurological:     Mental Status: She is alert. Mental status is at baseline.  Psychiatric:        Mood and Affect: Mood normal.        Behavior: Behavior normal.           Assessment & Plan:   Problem List Items Addressed This Visit      Nervous and Auditory   Acute swimmer's ear of right side - Primary    Pt reports hx of recurrent otitis externa requiring cleaning. She has drops at home which she will start and if not improve with do oral abx. Given chronic history referral to ENT now for follow-up treatment and ongoing care.       Relevant Orders   Ambulatory referral to ENT   Skin lesion of right ear    Reports she uses oil for chronic itching. Lesion with some skin breakdown and possible eczema. Vaseline then cortisone if not improving. Suspect this is the source of occasional blood.        Other Visit Diagnoses    Screening for  colon cancer       Relevant Orders   Ambulatory referral to Gastroenterology       Return if symptoms worsen or fail to improve.  [name], MD  This visit occurred during the SARS-CoV-2 public health emergency.  Safety protocols were in place, including screening questions prior to the visit, additional usage of staff PPE, and extensive cleaning of exam room while observing appropriate contact time as indicated for disinfecting solutions.

## 2020-06-07 NOTE — Patient Instructions (Addendum)
Use the ear drops 3-4 times per day -- call to let me know what you are taking  If no improvement in 2-3 days, call and we can try oral antibiotics  MyChart with the drops you are using and the oil that you have.    #Referral I have placed a referral to a specialist for you. You should receive a phone call from the specialty office. Make sure your voicemail is not full and that if you are able to answer your phone to unknown or new numbers.   It may take up to 2 weeks to hear about the referral. If you do not hear anything in 2 weeks, please call our office and ask to speak with the referral coordinator.    #Rash on the ear - try Vaseline for 3 days - if no improvement can try cortisone cream

## 2020-06-07 NOTE — Assessment & Plan Note (Signed)
Reports she uses oil for chronic itching. Lesion with some skin breakdown and possible eczema. Vaseline then cortisone if not improving. Suspect this is the source of occasional blood.

## 2020-06-08 ENCOUNTER — Encounter: Payer: BC Managed Care – PPO | Admitting: *Deleted

## 2020-06-08 DIAGNOSIS — Z79890 Hormone replacement therapy (postmenopausal): Secondary | ICD-10-CM | POA: Diagnosis not present

## 2020-06-08 DIAGNOSIS — I252 Old myocardial infarction: Secondary | ICD-10-CM | POA: Diagnosis not present

## 2020-06-08 DIAGNOSIS — Z7982 Long term (current) use of aspirin: Secondary | ICD-10-CM | POA: Diagnosis not present

## 2020-06-08 DIAGNOSIS — E785 Hyperlipidemia, unspecified: Secondary | ICD-10-CM | POA: Diagnosis not present

## 2020-06-08 DIAGNOSIS — E119 Type 2 diabetes mellitus without complications: Secondary | ICD-10-CM | POA: Diagnosis not present

## 2020-06-08 DIAGNOSIS — I213 ST elevation (STEMI) myocardial infarction of unspecified site: Secondary | ICD-10-CM

## 2020-06-08 DIAGNOSIS — Z79899 Other long term (current) drug therapy: Secondary | ICD-10-CM | POA: Diagnosis not present

## 2020-06-08 DIAGNOSIS — Z85850 Personal history of malignant neoplasm of thyroid: Secondary | ICD-10-CM | POA: Diagnosis not present

## 2020-06-08 DIAGNOSIS — Z7984 Long term (current) use of oral hypoglycemic drugs: Secondary | ICD-10-CM | POA: Diagnosis not present

## 2020-06-08 DIAGNOSIS — Z7902 Long term (current) use of antithrombotics/antiplatelets: Secondary | ICD-10-CM | POA: Diagnosis not present

## 2020-06-08 DIAGNOSIS — I10 Essential (primary) hypertension: Secondary | ICD-10-CM | POA: Diagnosis not present

## 2020-06-08 MED ORDER — NEOMYCIN-POLYMYXIN-HC 3.5-10000-1 OT SUSP: 3.0000 [drp] | Freq: Four times a day (QID) | OTIC | 0 refills | Status: DC

## 2020-06-08 NOTE — Progress Notes (Signed)
Daily Session Note  Patient Details  Name: Melissa Lamb MRN: [id number] Date of Birth: 1961/09/09 Referring Provider:     Cardiac Rehab from 04/11/2020 in [facility] Cardiac and Pulmonary Rehab  Referring Provider [facility]      Encounter Date: 06/08/2020  Check In:  Session Check In - 06/08/20 0932      Check-In   Supervising physician immediately available to respond to emergencies See telemetry face sheet for immediately available ER MD    Location [facility]-Cardiac & Pulmonary Rehab    Staff Present Renita Papa, RN Margurite Auerbach, MS Exercise Physiologist;Amanda Oletta Darter, [place], ACSM CEP, Exercise Physiologist    Virtual Visit No    Medication changes reported     No    Fall or balance concerns reported    No    Warm-up and Cool-down Performed on first and last piece of equipment    Resistance Training Performed Yes    VAD Patient? No    PAD/SET Patient? No      Pain Assessment   Currently in Pain? No/denies              Social History   Tobacco Use  Smoking Status Never Smoker  Smokeless Tobacco Never Used    Goals Met:  Independence with exercise equipment Exercise tolerated well No report of cardiac concerns or symptoms Strength training completed today  Goals Unmet:  Not Applicable  Comments: Pt able to follow exercise prescription today without complaint.  Will continue to monitor for progression.    Dr. Emily Filbert is Medical Director for [place] and [facility].

## 2020-06-09 ENCOUNTER — Ambulatory Visit: Payer: BC Managed Care – PPO | Admitting: Family Medicine

## 2020-06-13 ENCOUNTER — Other Ambulatory Visit: Payer: Self-pay

## 2020-06-13 ENCOUNTER — Encounter: Payer: BC Managed Care – PPO | Admitting: *Deleted

## 2020-06-13 DIAGNOSIS — Z79890 Hormone replacement therapy (postmenopausal): Secondary | ICD-10-CM | POA: Diagnosis not present

## 2020-06-13 DIAGNOSIS — Z79899 Other long term (current) drug therapy: Secondary | ICD-10-CM | POA: Diagnosis not present

## 2020-06-13 DIAGNOSIS — I213 ST elevation (STEMI) myocardial infarction of unspecified site: Secondary | ICD-10-CM

## 2020-06-13 DIAGNOSIS — Z7984 Long term (current) use of oral hypoglycemic drugs: Secondary | ICD-10-CM | POA: Diagnosis not present

## 2020-06-13 DIAGNOSIS — Z7902 Long term (current) use of antithrombotics/antiplatelets: Secondary | ICD-10-CM | POA: Diagnosis not present

## 2020-06-13 DIAGNOSIS — Z85850 Personal history of malignant neoplasm of thyroid: Secondary | ICD-10-CM | POA: Diagnosis not present

## 2020-06-13 DIAGNOSIS — E119 Type 2 diabetes mellitus without complications: Secondary | ICD-10-CM | POA: Diagnosis not present

## 2020-06-13 DIAGNOSIS — E785 Hyperlipidemia, unspecified: Secondary | ICD-10-CM | POA: Diagnosis not present

## 2020-06-13 DIAGNOSIS — Z7982 Long term (current) use of aspirin: Secondary | ICD-10-CM | POA: Diagnosis not present

## 2020-06-13 DIAGNOSIS — I252 Old myocardial infarction: Secondary | ICD-10-CM | POA: Diagnosis not present

## 2020-06-13 DIAGNOSIS — I10 Essential (primary) hypertension: Secondary | ICD-10-CM | POA: Diagnosis not present

## 2020-06-13 NOTE — Progress Notes (Signed)
Daily Session Note  Patient Details  Name: Melissa Lamb MRN: [id number] Date of Birth: May 09, 1961 Referring Provider:     Cardiac Rehab from 04/11/2020 in [facility] Cardiac and Pulmonary Rehab  Referring Provider [facility]      Encounter Date: 06/13/2020  Check In:  Session Check In - 06/13/20 0934      Check-In   Supervising physician immediately available to respond to emergencies See telemetry face sheet for immediately available ER MD    Location [facility]-Cardiac & Pulmonary Rehab    Staff Present Heath Lark, RN, BSN, Laveda Norman, BS, ACSM CEP, Exercise Physiologist;Joseph Tessie Fass RCP,RRT,BSRT    Virtual Visit No    Medication changes reported     No    Fall or balance concerns reported    No    Warm-up and Cool-down Performed on first and last piece of equipment    Resistance Training Performed Yes    VAD Patient? No    PAD/SET Patient? No      Pain Assessment   Currently in Pain? No/denies              Social History   Tobacco Use  Smoking Status Never Smoker  Smokeless Tobacco Never Used    Goals Met:  Independence with exercise equipment Exercise tolerated well No report of cardiac concerns or symptoms  Goals Unmet:  Not Applicable  Comments: Pt able to follow exercise prescription today without complaint.  Will continue to monitor for progression.    Dr. Emily Filbert is Medical Director for [place] and [facility].

## 2020-06-15 ENCOUNTER — Encounter: Payer: BC Managed Care – PPO | Admitting: *Deleted

## 2020-06-15 ENCOUNTER — Encounter: Payer: BC Managed Care – PPO | Admitting: Family Medicine

## 2020-06-15 ENCOUNTER — Other Ambulatory Visit: Payer: Self-pay

## 2020-06-15 DIAGNOSIS — Z7984 Long term (current) use of oral hypoglycemic drugs: Secondary | ICD-10-CM | POA: Diagnosis not present

## 2020-06-15 DIAGNOSIS — E785 Hyperlipidemia, unspecified: Secondary | ICD-10-CM | POA: Diagnosis not present

## 2020-06-15 DIAGNOSIS — Z85850 Personal history of malignant neoplasm of thyroid: Secondary | ICD-10-CM | POA: Diagnosis not present

## 2020-06-15 DIAGNOSIS — E119 Type 2 diabetes mellitus without complications: Secondary | ICD-10-CM | POA: Diagnosis not present

## 2020-06-15 DIAGNOSIS — Z7982 Long term (current) use of aspirin: Secondary | ICD-10-CM | POA: Diagnosis not present

## 2020-06-15 DIAGNOSIS — I10 Essential (primary) hypertension: Secondary | ICD-10-CM | POA: Diagnosis not present

## 2020-06-15 DIAGNOSIS — Z79890 Hormone replacement therapy (postmenopausal): Secondary | ICD-10-CM | POA: Diagnosis not present

## 2020-06-15 DIAGNOSIS — I213 ST elevation (STEMI) myocardial infarction of unspecified site: Secondary | ICD-10-CM

## 2020-06-15 DIAGNOSIS — I252 Old myocardial infarction: Secondary | ICD-10-CM | POA: Diagnosis not present

## 2020-06-15 DIAGNOSIS — Z79899 Other long term (current) drug therapy: Secondary | ICD-10-CM | POA: Diagnosis not present

## 2020-06-15 DIAGNOSIS — Z7902 Long term (current) use of antithrombotics/antiplatelets: Secondary | ICD-10-CM | POA: Diagnosis not present

## 2020-06-15 NOTE — Progress Notes (Signed)
Daily Session Note  Patient Details  Name: Melissa Lamb MRN: [id number] Date of Birth: 08/04/1961 Referring Provider:     Cardiac Rehab from 04/11/2020 in [facility] Cardiac and Pulmonary Rehab  Referring Provider [facility]      Encounter Date: 06/15/2020  Check In:  Session Check In - 06/15/20 0917      Check-In   Supervising physician immediately available to respond to emergencies See telemetry face sheet for immediately available ER MD    Location [facility]-Cardiac & Pulmonary Rehab    Staff Present Renita Papa, RN BSN;Jessica Cale, MA, RCEP, CCRP, CCET;Amanda Sommer, [place], ACSM CEP, Exercise Physiologist;Melissa Caiola RDN, LDN    Virtual Visit No    Medication changes reported     No    Fall or balance concerns reported    No    Warm-up and Cool-down Performed on first and last piece of equipment    Resistance Training Performed Yes    VAD Patient? No    PAD/SET Patient? No      Pain Assessment   Currently in Pain? No/denies              Social History   Tobacco Use  Smoking Status Never Smoker  Smokeless Tobacco Never Used    Goals Met:  Independence with exercise equipment Exercise tolerated well No report of cardiac concerns or symptoms Strength training completed today  Goals Unmet:  Not Applicable  Comments: Pt able to follow exercise prescription today without complaint.  Will continue to monitor for progression.    Dr. Emily Filbert is Medical Director for [place] and [facility].

## 2020-06-16 ENCOUNTER — Telehealth (INDEPENDENT_AMBULATORY_CARE_PROVIDER_SITE_OTHER): Payer: Self-pay | Admitting: Gastroenterology

## 2020-06-16 ENCOUNTER — Other Ambulatory Visit: Payer: Self-pay

## 2020-06-16 DIAGNOSIS — Z86010 Personal history of colonic polyps: Secondary | ICD-10-CM

## 2020-06-16 DIAGNOSIS — Z1211 Encounter for screening for malignant neoplasm of colon: Secondary | ICD-10-CM

## 2020-06-16 NOTE — Progress Notes (Signed)
Gastroenterology Pre-Procedure Review  Request Date: Monday 10/24/20 Requesting Physician: Dr. Marius Ditch  PATIENT REVIEW QUESTIONS: The patient responded to the following health history questions as indicated:    1. Are you having any GI issues? yes (Breakthrough diarrhea thinks this is medication related.  Takes probiotics and it helps.  She has been advised that if it does not get better she can contact the office for an appt.) 2. Do you have a personal history of Polyps? yes (11/02/2013 a small polyp was noted by Dr. Levester Fresh.  He recommended repeat colonoscopy in 5 years.  This was performed in [place]) 3. Do you have a family history of Colon Cancer or Polyps? no 4. Diabetes Mellitus? Yes takes oral meds 5. Joint replacements in the past 12 months?no 6. Major health problems in the past 3 months?yes (Patient states she had a heart attack in May.  She has 2 stents.  Cardiologist advised to schedule colonoscopy 6 months out. We've scheduled for early December, because she would like to try to have it this year.  Cardiac clearance and blood thinner clearance will be sent to Dr. Etta Quill office.) 7. Any artificial heart valves, MVP, or defibrillator?2 Stents    MEDICATIONS & ALLERGIES:    Patient reports the following regarding taking any anticoagulation/antiplatelet therapy:   Plavix, Coumadin, Eliquis, Xarelto, Lovenox, Pradaxa, Brilinta, or Effient? yes (Brilinta '90mg'$  BID-blood thinner request sent to Dr. Marianna Payment office.) Aspirin? yes (81 mg daily)  Patient confirms/reports the following medications:  Current Outpatient Medications  Medication Sig Dispense Refill  . acetic acid-hydrocortisone (VOSOL-HC) OTIC solution 2 (two) times daily.    Marland Kitchen aspirin 81 MG chewable tablet Chew 1 tablet (81 mg total) by mouth daily. Need to take for at least a year.    . Blood Glucose Monitoring Suppl (ACCU-CHEK AVIVA PLUS) w/Device KIT USE TO CHECK BLOOD SUGAR ONCE DAILY    . Cholecalciferol (VITAMIN  D-1000 MAX ST) 25 MCG (1000 UT) tablet Take 2,000 Units by mouth daily.     . fenofibrate 160 MG tablet Take 1 tablet (160 mg total) by mouth daily. 90 tablet 2  . fluocinolone (SYNALAR) 0.01 % external solution Apply topically 2 (two) times daily.    Marland Kitchen glipiZIDE (GLUCOTROL) 5 MG tablet Take 1.5 tablets (7.5 mg total) by mouth daily. 45 tablet 5  . glucose blood (ACCU-CHEK AVIVA PLUS) test strip USE TO CHECK BLOOD SUGAR ONCE DAILY    . levothyroxine (SYNTHROID) 88 MCG tablet Take 88 mcg by mouth daily.     Marland Kitchen losartan (COZAAR) 25 MG tablet Take 25 mg by mouth daily.    . metFORMIN (GLUCOPHAGE) 500 MG tablet Take 1 tablet (500 mg total) by mouth 2 (two) times daily. 60 tablet 5  . metoprolol succinate (TOPROL-XL) 50 MG 24 hr tablet Take 1 tablet (50 mg total) by mouth daily. Take with or immediately following a meal. 30 tablet 0  . neomycin-polymyxin-hydrocortisone (CORTISPORIN) 3.5-10000-1 OTIC suspension Place 3 drops into the right ear 4 (four) times daily. 10 mL 0  . rosuvastatin (CRESTOR) 40 MG tablet Take 1 tablet (40 mg total) by mouth daily. 30 tablet 2  . ticagrelor (BRILINTA) 90 MG TABS tablet Take 1 tablet (90 mg total) by mouth 2 (two) times daily. Need to take this for at least a year. 60 tablet 11   No current facility-administered medications for this visit.    Patient confirms/reports the following allergies:  Allergies  Allergen Reactions  . Lisinopril Tinitus and Cough  No orders of the defined types were placed in this encounter.   AUTHORIZATION INFORMATION Primary Insurance: 1D#: Group #:  Secondary Insurance: 1D#: Group #:  SCHEDULE INFORMATION: Date: 10/24/20/21 Time: Location:[facility]

## 2020-06-20 ENCOUNTER — Encounter: Payer: BC Managed Care – PPO | Attending: Internal Medicine | Admitting: *Deleted

## 2020-06-20 ENCOUNTER — Other Ambulatory Visit: Payer: Self-pay

## 2020-06-20 DIAGNOSIS — I10 Essential (primary) hypertension: Secondary | ICD-10-CM | POA: Diagnosis not present

## 2020-06-20 DIAGNOSIS — E785 Hyperlipidemia, unspecified: Secondary | ICD-10-CM | POA: Insufficient documentation

## 2020-06-20 DIAGNOSIS — Z7984 Long term (current) use of oral hypoglycemic drugs: Secondary | ICD-10-CM | POA: Diagnosis not present

## 2020-06-20 DIAGNOSIS — Z79890 Hormone replacement therapy (postmenopausal): Secondary | ICD-10-CM | POA: Diagnosis not present

## 2020-06-20 DIAGNOSIS — E119 Type 2 diabetes mellitus without complications: Secondary | ICD-10-CM | POA: Insufficient documentation

## 2020-06-20 DIAGNOSIS — Z85850 Personal history of malignant neoplasm of thyroid: Secondary | ICD-10-CM | POA: Insufficient documentation

## 2020-06-20 DIAGNOSIS — Z7902 Long term (current) use of antithrombotics/antiplatelets: Secondary | ICD-10-CM | POA: Insufficient documentation

## 2020-06-20 DIAGNOSIS — I252 Old myocardial infarction: Secondary | ICD-10-CM | POA: Diagnosis not present

## 2020-06-20 DIAGNOSIS — Z79899 Other long term (current) drug therapy: Secondary | ICD-10-CM | POA: Diagnosis not present

## 2020-06-20 DIAGNOSIS — I213 ST elevation (STEMI) myocardial infarction of unspecified site: Secondary | ICD-10-CM

## 2020-06-20 DIAGNOSIS — Z7982 Long term (current) use of aspirin: Secondary | ICD-10-CM | POA: Insufficient documentation

## 2020-06-20 NOTE — Progress Notes (Signed)
Daily Session Note  Patient Details  Name: Melissa Lamb MRN: [id number] Date of Birth: [birth year] Referring Provider:     Cardiac Rehab from 04/11/2020 in [facility] Cardiac [facility] Pulmonary Rehab  Referring Provider [facility]      Encounter Date: 06/20/2020  Check In:  Session Check In - 06/20/20 0165      Check-In   Supervising physician immediately available to respond to emergencies See telemetry face sheet for immediately available ER MD    Location [facility]-Cardiac & Pulmonary Rehab    Staff Present Heath Lark, RN, BSN, Laveda Norman, BS, ACSM CEP, Exercise Physiologist;Joseph Tessie Fass RCP,RRT,BSRT    Virtual Visit No    Medication changes reported     No    Fall or balance concerns reported    No    Warm-up and Cool-down Performed on first and last piece of equipment    Resistance Training Performed Yes    VAD Patient? No    PAD/SET Patient? No      Pain Assessment   Currently in Pain? No/denies              Social History   Tobacco Use  Smoking Status Never Smoker  Smokeless Tobacco Never Used    Goals Met:  Independence with exercise equipment Exercise tolerated well No report of cardiac concerns or symptoms  Goals Unmet:  Not Applicable  Comments: Pt able to follow exercise prescription today without complaint.  Will continue to monitor for progression.    Dr. Emily Filbert is Medical Director for [place] and [facility].

## 2020-06-22 ENCOUNTER — Other Ambulatory Visit: Payer: Self-pay

## 2020-06-22 ENCOUNTER — Encounter: Payer: BC Managed Care – PPO | Admitting: *Deleted

## 2020-06-22 DIAGNOSIS — Z79899 Other long term (current) drug therapy: Secondary | ICD-10-CM | POA: Diagnosis not present

## 2020-06-22 DIAGNOSIS — I252 Old myocardial infarction: Secondary | ICD-10-CM | POA: Diagnosis not present

## 2020-06-22 DIAGNOSIS — E119 Type 2 diabetes mellitus without complications: Secondary | ICD-10-CM | POA: Diagnosis not present

## 2020-06-22 DIAGNOSIS — Z7984 Long term (current) use of oral hypoglycemic drugs: Secondary | ICD-10-CM | POA: Diagnosis not present

## 2020-06-22 DIAGNOSIS — Z79890 Hormone replacement therapy (postmenopausal): Secondary | ICD-10-CM | POA: Diagnosis not present

## 2020-06-22 DIAGNOSIS — Z85850 Personal history of malignant neoplasm of thyroid: Secondary | ICD-10-CM | POA: Diagnosis not present

## 2020-06-22 DIAGNOSIS — I10 Essential (primary) hypertension: Secondary | ICD-10-CM | POA: Diagnosis not present

## 2020-06-22 DIAGNOSIS — Z7982 Long term (current) use of aspirin: Secondary | ICD-10-CM | POA: Diagnosis not present

## 2020-06-22 DIAGNOSIS — Z7902 Long term (current) use of antithrombotics/antiplatelets: Secondary | ICD-10-CM | POA: Diagnosis not present

## 2020-06-22 DIAGNOSIS — E785 Hyperlipidemia, unspecified: Secondary | ICD-10-CM | POA: Diagnosis not present

## 2020-06-22 DIAGNOSIS — I213 ST elevation (STEMI) myocardial infarction of unspecified site: Secondary | ICD-10-CM

## 2020-06-22 NOTE — Progress Notes (Signed)
Daily Session Note  Patient Details  Name: Melissa Lamb MRN: [id number] Date of Birth: 12-10-1960 Referring Provider:     Cardiac Rehab from 04/11/2020 in [facility] Cardiac and Pulmonary Rehab  Referring Provider [facility]      Encounter Date: 06/22/2020  Check In:  Session Check In - 06/22/20 0938      Check-In   Supervising physician immediately available to respond to emergencies See telemetry face sheet for immediately available ER MD    Location [facility]-Cardiac & Pulmonary Rehab    Staff Present Melissa Lark, RN, BSN, CCRP;Melissa Lamb, [place], ACSM CEP, Exercise Physiologist    Virtual Visit No    Medication changes reported     No    Fall or balance concerns reported    No    Warm-up and Cool-down Performed on first and last piece of equipment    Resistance Training Performed Yes    VAD Patient? No    PAD/SET Patient? No      Pain Assessment   Currently in Pain? No/denies              Social History   Tobacco Use  Smoking Status Never Smoker  Smokeless Tobacco Never Used    Goals Met:  Independence with exercise equipment Exercise tolerated well No report of cardiac concerns or symptoms  Goals Unmet:  Not Applicable  Comments: Pt able to follow exercise prescription today without complaint.  Will continue to monitor for progression.    Dr. Emily Lamb is Medical Director for [place] and [facility].

## 2020-06-24 ENCOUNTER — Other Ambulatory Visit: Payer: Self-pay

## 2020-06-24 ENCOUNTER — Encounter: Payer: BC Managed Care – PPO | Admitting: *Deleted

## 2020-06-24 DIAGNOSIS — Z79899 Other long term (current) drug therapy: Secondary | ICD-10-CM | POA: Diagnosis not present

## 2020-06-24 DIAGNOSIS — Z85850 Personal history of malignant neoplasm of thyroid: Secondary | ICD-10-CM | POA: Diagnosis not present

## 2020-06-24 DIAGNOSIS — I213 ST elevation (STEMI) myocardial infarction of unspecified site: Secondary | ICD-10-CM

## 2020-06-24 DIAGNOSIS — E785 Hyperlipidemia, unspecified: Secondary | ICD-10-CM | POA: Diagnosis not present

## 2020-06-24 DIAGNOSIS — I10 Essential (primary) hypertension: Secondary | ICD-10-CM | POA: Diagnosis not present

## 2020-06-24 DIAGNOSIS — Z7902 Long term (current) use of antithrombotics/antiplatelets: Secondary | ICD-10-CM | POA: Diagnosis not present

## 2020-06-24 DIAGNOSIS — E119 Type 2 diabetes mellitus without complications: Secondary | ICD-10-CM | POA: Diagnosis not present

## 2020-06-24 DIAGNOSIS — Z7984 Long term (current) use of oral hypoglycemic drugs: Secondary | ICD-10-CM | POA: Diagnosis not present

## 2020-06-24 DIAGNOSIS — Z79890 Hormone replacement therapy (postmenopausal): Secondary | ICD-10-CM | POA: Diagnosis not present

## 2020-06-24 DIAGNOSIS — I252 Old myocardial infarction: Secondary | ICD-10-CM | POA: Diagnosis not present

## 2020-06-24 DIAGNOSIS — Z7982 Long term (current) use of aspirin: Secondary | ICD-10-CM | POA: Diagnosis not present

## 2020-06-24 NOTE — Progress Notes (Signed)
Daily Session Note  Patient Details  Name: Melissa Lamb MRN: [id number] Date of Birth: 30-Jun-1961 Referring Provider:     Cardiac Rehab from 04/11/2020 in [facility] Cardiac and Pulmonary Rehab  Referring Provider [facility]      Encounter Date: 06/24/2020  Check In:  Session Check In - 06/24/20 0924      Check-In   Supervising physician immediately available to respond to emergencies See telemetry face sheet for immediately available ER MD    Location [facility]-Cardiac & Pulmonary Rehab    Staff Present Renita Papa, RN BSN;Joseph Lou Miner, [place] Exercise Physiologist    Virtual Visit No    Medication changes reported     No    Fall or balance concerns reported    No    Warm-up and Cool-down Performed on first and last piece of equipment    Resistance Training Performed Yes    VAD Patient? No    PAD/SET Patient? No      Pain Assessment   Currently in Pain? No/denies              Social History   Tobacco Use  Smoking Status Never Smoker  Smokeless Tobacco Never Used    Goals Met:  Independence with exercise equipment Exercise tolerated well No report of cardiac concerns or symptoms Strength training completed today  Goals Unmet:  Not Applicable  Comments: Pt able to follow exercise prescription today without complaint.  Will continue to monitor for progression.    Dr. Emily Filbert is Medical Director for [place] and [facility].

## 2020-06-29 ENCOUNTER — Encounter: Payer: Self-pay | Admitting: *Deleted

## 2020-06-29 DIAGNOSIS — I213 ST elevation (STEMI) myocardial infarction of unspecified site: Secondary | ICD-10-CM

## 2020-06-29 NOTE — Progress Notes (Signed)
Cardiac Individual Treatment Plan  Patient Details  Name: Melissa Lamb MRN: [id number] Date of Birth: Mar 28, 1961 Referring Provider:     Cardiac Rehab from 04/11/2020 in [facility] Cardiac and Pulmonary Rehab  Referring Provider [facility]      Initial Encounter Date:    Cardiac Rehab from 04/11/2020 in [facility] Cardiac and Pulmonary Rehab  Date 04/11/20      Visit Diagnosis: ST elevation myocardial infarction (STEMI), unspecified artery ([place])  Patient's Home Medications on Admission:  Current Outpatient Medications:  .  acetic acid-hydrocortisone (VOSOL-HC) OTIC solution, 2 (two) times daily., Disp: , Rfl:  .  aspirin 81 MG chewable tablet, Chew 1 tablet (81 mg total) by mouth daily. Need to take for at least a year., Disp: , Rfl:  .  Blood Glucose Monitoring Suppl (ACCU-CHEK AVIVA PLUS) w/Device KIT, USE TO CHECK BLOOD SUGAR ONCE DAILY, Disp: , Rfl:  .  Cholecalciferol (VITAMIN D-1000 MAX ST) 25 MCG (1000 UT) tablet, Take 2,000 Units by mouth daily. , Disp: , Rfl:  .  fenofibrate 160 MG tablet, Take 1 tablet (160 mg total) by mouth daily., Disp: 90 tablet, Rfl: 2 .  fluocinolone (SYNALAR) 0.01 % external solution, Apply topically 2 (two) times daily., Disp: , Rfl:  .  glipiZIDE (GLUCOTROL) 5 MG tablet, Take 1.5 tablets (7.5 mg total) by mouth daily., Disp: 45 tablet, Rfl: 5 .  glucose blood (ACCU-CHEK AVIVA PLUS) test strip, USE TO CHECK BLOOD SUGAR ONCE DAILY, Disp: , Rfl:  .  levothyroxine (SYNTHROID) 88 MCG tablet, Take 88 mcg by mouth daily. , Disp: , Rfl:  .  losartan (COZAAR) 25 MG tablet, Take 25 mg by mouth daily., Disp: , Rfl:  .  metFORMIN (GLUCOPHAGE) 500 MG tablet, Take 1 tablet (500 mg total) by mouth 2 (two) times daily., Disp: 60 tablet, Rfl: 5 .  metoprolol succinate (TOPROL-XL) 50 MG 24 hr tablet, Take 1 tablet (50 mg total) by mouth daily. Take with or immediately following a meal., Disp: 30 tablet, Rfl: 0 .  neomycin-polymyxin-hydrocortisone (CORTISPORIN) 3.5-10000-1 OTIC  suspension, Place 3 drops into the right ear 4 (four) times daily., Disp: 10 mL, Rfl: 0 .  rosuvastatin (CRESTOR) 40 MG tablet, Take 1 tablet (40 mg total) by mouth daily., Disp: 30 tablet, Rfl: 2 .  ticagrelor (BRILINTA) 90 MG TABS tablet, Take 1 tablet (90 mg total) by mouth 2 (two) times daily. Need to take this for at least a year., Disp: 60 tablet, Rfl: 11  Past Medical History: Past Medical History:  Diagnosis Date  . Diabetes mellitus without complication ([place])   . History of chickenpox   . Hyperlipidemia   . Hypertension   . Thyroid cancer ([place])     Tobacco Use: Social History   Tobacco Use  Smoking Status Never Smoker  Smokeless Tobacco Never Used    Labs: Recent Review Flowsheet Data    Labs for ITP Cardiac and Pulmonary Rehab Latest Ref Rng & Units 07/10/2018 10/20/2019 02/22/2020 04/01/2020 04/02/2020   Cholestrol 0 - 200 mg/dL 250(A) - - - 182   LDLCALC 0 - 99 mg/dL 144 - - - 96   HDL >40 mg/dL 31(A) - - - 29(L)   Trlycerides <150 mg/dL 375(A) - - - 284(H)   Hemoglobin A1c 4.8 - 5.6 % 6.0 5.8(A) 6.1(A) 6.0(H) -       Exercise Target Goals: Exercise Program Goal: Individual exercise prescription set using results from initial 6 min walk test and THRR while considering  patient's activity barriers and  safety.   Exercise Prescription Goal: Initial exercise prescription builds to 30-45 minutes a day of aerobic activity, 2-3 days per week.  Home exercise guidelines will be given to patient during program as part of exercise prescription that the participant will acknowledge.   Education: Aerobic Exercise & Resistance Training: - Gives group verbal and written instruction on the various components of exercise. Focuses on aerobic and resistive training programs and the benefits of this training and how to safely progress through these programs..   Education: Exercise & Equipment Safety: - Individual verbal instruction and demonstration of equipment use and safety with  use of the equipment.   Cardiac Rehab from 04/11/2020 in [facility] Cardiac and Pulmonary Rehab  Date 04/11/20  Educator AS  Instruction Review Code 1- Verbalizes Understanding      Education: Exercise Physiology & General Exercise Guidelines: - Group verbal and written instruction with models to review the exercise physiology of the cardiovascular system and associated critical values. Provides general exercise guidelines with specific guidelines to those with heart or lung disease.    Education: Flexibility, Balance, Mind/Body Relaxation: Provides group verbal/written instruction on the benefits of flexibility and balance training, including mind/body exercise modes such as yoga, pilates and tai chi.  Demonstration and skill practice provided.   Activity Barriers & Risk Stratification:   6 Minute Walk:  6 Minute Walk    Row Name 04/11/20 1436         6 Minute Walk   Phase Initial     Distance 1300 feet     Walk Time 6 minutes     # of Rest Breaks 0     MPH 2.46     METS 3.99     RPE 11     Perceived Dyspnea  1     VO2 Peak 13.96     Symptoms No     Resting HR 78 bpm     Resting BP 118/64     Resting Oxygen Saturation  98 %     Exercise Oxygen Saturation  during 6 min walk 100 %     Max Ex. HR 115 bpm     Max Ex. BP 130/62     2 Minute Post BP 122/64            Oxygen Initial Assessment:   Oxygen Re-Evaluation:   Oxygen Discharge (Final Oxygen Re-Evaluation):   Initial Exercise Prescription:  Initial Exercise Prescription - 04/11/20 1400      Date of Initial Exercise RX and Referring Provider   Date 04/11/20    Referring Provider [facility]      Treadmill   MPH 2.5    Grade 3    Minutes 15    METs 4      Recumbant Bike   Level 3    RPM 60    Watts 41    Minutes 15    METs 4      NuStep   Level 3    SPM 80    Minutes 15    METs 4      REL-XR   Level 4    Speed 50    Minutes 15    METs 4      T5 Nustep   Level 2    SPM 80    Minutes 15     METs 4      Prescription Details   Frequency (times per week) 3      Intensity   THRR  40-80% of Max Heartrate 111-144    Ratings of Perceived Exertion 11-15    Perceived Dyspnea 0-4      Resistance Training   Training Prescription Yes    Weight 3 lb    Reps 10-15           Perform Capillary Blood Glucose checks as needed.  Exercise Prescription Changes:  Exercise Prescription Changes    Row Name 04/11/20 1400 04/25/20 1600 05/10/20 1400 05/24/20 1400 06/07/20 1300     Response to Exercise   Blood Pressure (Admit) 118/64 116/72 102/60 126/62 110/62   Blood Pressure (Exercise) 130/62 128/70 130/70 134/68 124/74   Blood Pressure (Exit) 122/64 120/62 122/70 120/60 110/60   Heart Rate (Admit) 78 bpm 76 bpm 71 bpm 75 bpm 78 bpm   Heart Rate (Exercise) 115 bpm 112 bpm 113 bpm 113 bpm 109 bpm   Heart Rate (Exit) 70 bpm 85 bpm 77 bpm 78 bpm 82 bpm   Oxygen Saturation (Admit) 98 % -- -- -- --   Oxygen Saturation (Exercise) 100 % -- -- -- --   Rating of Perceived Exertion (Exercise) '11 12 12 13 13   '$ Perceived Dyspnea (Exercise) 1 -- -- -- --   Symptoms none none none none none   Comments -- third full day of exercise -- -- --   Duration -- Continue with 30 min of aerobic exercise without signs/symptoms of physical distress. Continue with 30 min of aerobic exercise without signs/symptoms of physical distress. Continue with 30 min of aerobic exercise without signs/symptoms of physical distress. Continue with 30 min of aerobic exercise without signs/symptoms of physical distress.   Intensity -- THRR unchanged THRR unchanged THRR unchanged THRR unchanged     Progression   Progression -- Continue to progress workloads to maintain intensity without signs/symptoms of physical distress. Continue to progress workloads to maintain intensity without signs/symptoms of physical distress. Continue to progress workloads to maintain intensity without signs/symptoms of physical distress. Continue  to progress workloads to maintain intensity without signs/symptoms of physical distress.   Average METs -- 3.95 4.2 3.76 3.85     Resistance Training   Training Prescription -- Yes Yes Yes Yes   Weight -- 3 lb 3 lb 3 lb 3 lb   Reps -- 10-15 10-15 10-15 10-15     Interval Training   Interval Training -- No No No No     Treadmill   MPH -- 2.'8 3 3 '$ 3.2   Grade -- 3 3.5 3.5 3.5   Minutes -- '15 15 15 15   '$ METs -- 4.3 4.3 3.75 4.99     Elliptical   Level -- -- -- 2 --   Speed -- -- -- 2.3 --   Minutes -- -- -- 15 --   METs -- -- -- 2.6 --     REL-XR   Level -- '4 4 4 4   '$ Speed -- -- 50 -- 50   Minutes -- '15 15 15 15   '$ METs -- 3.6 4.1 3.8 2.7   Row Name 06/22/20 1100             Response to Exercise   Blood Pressure (Admit) 130/64       Blood Pressure (Exercise) 142/74       Blood Pressure (Exit) 124/72       Heart Rate (Admit) 76 bpm       Heart Rate (Exercise) 108 bpm       Heart Rate (Exit) 82 bpm  Rating of Perceived Exertion (Exercise) 12       Symptoms none       Duration Progress to 30 minutes of  aerobic without signs/symptoms of physical distress       Intensity THRR unchanged         Progression   Progression Continue to progress workloads to maintain intensity without signs/symptoms of physical distress.       Average METs 4.67         Resistance Training   Training Prescription Yes       Weight 3 lb       Reps 10-15         Interval Training   Interval Training No         Treadmill   MPH 3.4       Grade 3.5       Minutes 15       METs 5.24         REL-XR   Level 4       Minutes 15       METs 2.3              Exercise Comments:  Exercise Comments    Row Name 04/15/20 1118           Exercise Comments First full day of exercise!  Patient was oriented to gym and equipment including functions, settings, policies, and procedures.  Patient's individual exercise prescription and treatment plan were reviewed.  All starting workloads were  established based on the results of the 6 minute walk test done at initial orientation visit.  The plan for exercise progression was also introduced and progression will be customized based on patient's performance and goals.              Exercise Goals and Review:  Exercise Goals    Row Name 04/11/20 1442             Exercise Goals   Increase Physical Activity Yes       Intervention Provide advice, education, support and counseling about physical activity/exercise needs.;Develop an individualized exercise prescription for aerobic and resistive training based on initial evaluation findings, risk stratification, comorbidities and participant's personal goals.       Expected Outcomes Short Term: Attend rehab on a regular basis to increase amount of physical activity.;Long Term: Add in home exercise to make exercise part of routine and to increase amount of physical activity.;Long Term: Exercising regularly at least 3-5 days a week.       Increase Strength and Stamina Yes       Intervention Provide advice, education, support and counseling about physical activity/exercise needs.;Develop an individualized exercise prescription for aerobic and resistive training based on initial evaluation findings, risk stratification, comorbidities and participant's personal goals.       Expected Outcomes Short Term: Increase workloads from initial exercise prescription for resistance, speed, and METs.;Short Term: Perform resistance training exercises routinely during rehab and add in resistance training at home;Long Term: Improve cardiorespiratory fitness, muscular endurance and strength as measured by increased METs and functional capacity (6MWT)       Able to understand and use rate of perceived exertion (RPE) scale Yes       Intervention Provide education and explanation on how to use RPE scale       Expected Outcomes Short Term: Able to use RPE daily in rehab to express subjective intensity level;Long Term:   Able to use RPE to guide intensity level  when exercising independently       Knowledge and understanding of Target Heart Rate Range (THRR) Yes       Intervention Provide education and explanation of THRR including how the numbers were predicted and where they are located for reference       Expected Outcomes Short Term: Able to state/look up THRR;Short Term: Able to use daily as guideline for intensity in rehab;Long Term: Able to use THRR to govern intensity when exercising independently       Able to check pulse independently Yes       Intervention Provide education and demonstration on how to check pulse in carotid and radial arteries.;Review the importance of being able to check your own pulse for safety during independent exercise       Expected Outcomes Short Term: Able to explain why pulse checking is important during independent exercise;Long Term: Able to check pulse independently and accurately       Understanding of Exercise Prescription Yes       Intervention Provide education, explanation, and written materials on patient's individual exercise prescription       Expected Outcomes Short Term: Able to explain program exercise prescription;Long Term: Able to explain home exercise prescription to exercise independently              Exercise Goals Re-Evaluation :  Exercise Goals Re-Evaluation    Row Name 04/15/20 1119 04/25/20 1600 05/10/20 1431 05/18/20 0934 05/24/20 1407     Exercise Goal Re-Evaluation   Exercise Goals Review Knowledge and understanding of Target Heart Rate Range (THRR);Able to understand and use rate of perceived exertion (RPE) scale;Understanding of Exercise Prescription Increase Strength and Stamina;Increase Physical Activity;Understanding of Exercise Prescription Increase Strength and Stamina;Increase Physical Activity;Understanding of Exercise Prescription Increase Strength and Stamina;Increase Physical Activity;Understanding of Exercise Prescription Increase  Strength and Stamina;Increase Physical Activity;Understanding of Exercise Prescription   Comments Reviewed RPE and dyspnea scales, THR and program prescription with pt today.  Pt voiced understanding and was given a copy of goals to take home. Zurii is off to a good start in rehab.  She has completed her first three full days of exercise and is already up to 2.8 mph on the treadmill.  We will continue to monitor her progress. Rana has increased workloads on TM and XR.  She reaches THR range on TM, but needs to increase level on XR.  Staff will monitor progress. Chasty has been doing well. She is walking at home on her off days, 30-45 minutes, each time and an extra 2x/week,  reports no issues. She also uses handweights at home, although states she does not do it as much as she would like to. Encouraged to incorporate more hand weights along with her endurance exercise. Continues to take her BP and HR at home and reports normal ranges. Annalucia is doing well in ehab.  She is up to level 2 on the ellipitcal now!  We will conitnue to monitor her progress.   Expected Outcomes Short: Use RPE daily to regulate intensity. Long: Follow program prescription in THR. Short: Continue to attend rehab regularly Long: Continue to follow program prescription. Short: Continue to attend rehab regularly Long: Continue to follow program prescription. Short: Continue to incorporate more hand weights Long: Increase strength/stamina, progress overall MET level Short: Talk about adding in intervals on seated equipment Long; Continue to improve stamina.   [place] Name 06/07/20 1316 06/13/20 0936 06/22/20 1120         Exercise Goal Re-Evaluation  Exercise Goals Review Increase Strength and Stamina;Increase Physical Activity;Understanding of Exercise Prescription Increase Strength and Stamina;Increase Physical Activity;Understanding of Exercise Prescription Increase Strength and Stamina;Increase Physical Activity;Understanding of  Exercise Prescription     Comments Aija continues to progress well and is gradually increasing levels. Akeyla is walking outside on some days not at [facility].  She has a fitness center in her neighborhood she can use if needed.  We discussed interval trining on seated machines Clessie is doing well in rehab. She has increased her speed on the treadmill. She is maintaining appropriate THR and RPE scale. Will continue to monitor.     Expected Outcomes Short: try intervals Long: increase overall MET level Short:  add intervals on XR Long: increase stamina Short: Continue attending rehab Long: Progress MET level and increase workloads            Discharge Exercise Prescription (Final Exercise Prescription Changes):  Exercise Prescription Changes - 06/22/20 1100      Response to Exercise   Blood Pressure (Admit) 130/64    Blood Pressure (Exercise) 142/74    Blood Pressure (Exit) 124/72    Heart Rate (Admit) 76 bpm    Heart Rate (Exercise) 108 bpm    Heart Rate (Exit) 82 bpm    Rating of Perceived Exertion (Exercise) 12    Symptoms none    Duration Progress to 30 minutes of  aerobic without signs/symptoms of physical distress    Intensity THRR unchanged      Progression   Progression Continue to progress workloads to maintain intensity without signs/symptoms of physical distress.    Average METs 4.67      Resistance Training   Training Prescription Yes    Weight 3 lb    Reps 10-15      Interval Training   Interval Training No      Treadmill   MPH 3.4    Grade 3.5    Minutes 15    METs 5.24      REL-XR   Level 4    Minutes 15    METs 2.3           Nutrition:  Target Goals: Understanding of nutrition guidelines, daily intake of sodium '1500mg'$ , cholesterol '200mg'$ , calories 30% from fat and 7% or less from saturated fats, daily to have 5 or more servings of fruits and vegetables.  Education: Controlling Sodium/Reading Food Labels -Group verbal and written material supporting  the discussion of sodium use in heart healthy nutrition. Review and explanation with models, verbal and written materials for utilization of the food label.   Education: General Nutrition Guidelines/Fats and Fiber: -Group instruction provided by verbal, written material, models and posters to present the general guidelines for heart healthy nutrition. Gives an explanation and review of dietary fats and fiber.   Biometrics:  Pre Biometrics - 04/11/20 1448      Pre Biometrics   Height '5\' 8"'$  (1.727 m)    Weight 143 lb 14.4 oz (65.3 kg)    BMI (Calculated) 21.89    Single Leg Stand 30 seconds            Nutrition Therapy Plan and Nutrition Goals:  Nutrition Therapy & Goals - 06/15/20 0927      Nutrition Therapy   Diet Heart healthy, Low Na, T2DM    Protein (specify units) 55g    Fiber 25 grams    Whole Grain Foods 3 servings    Saturated Fats 12 max. grams    Fruits and  Vegetables 5 servings/day    Sodium 1.5 grams      Personal Nutrition Goals   Nutrition Goal ST: read labels to ensure managing sodium, variety  LT: continue with current changes    Comments Usually cooks. B: Almond milk muffin with butter (usually does peanut butter). L: salad D: protein with vegetables like broccoli or asparagus. uses a variety of protein foods such as meat, fish, and chicken. Usually cooks with olive oil, uses salt, but goes light. Mixture of whole grains and refined grains. Tries not to snack and when she does she will have fruit or another healhty snack. BG is doing well around 130; 6.1 A1C. T2DM. Discussed heart healthy eating and T2DM eating.      Intervention Plan   Intervention Prescribe, educate and counsel regarding individualized specific dietary modifications aiming towards targeted core components such as weight, hypertension, lipid management, diabetes, heart failure and other comorbidities.;Nutrition handout(s) given to patient.    Expected Outcomes Short Term Goal: Understand basic  principles of dietary content, such as calories, fat, sodium, cholesterol and nutrients.;Short Term Goal: A plan has been developed with personal nutrition goals set during dietitian appointment.;Long Term Goal: Adherence to prescribed nutrition plan.           Nutrition Assessments:  Nutrition Assessments - 04/11/20 1451      MEDFICTS Scores   Pre Score 18           MEDIFICTS Score Key:          ?70 Need to make dietary changes          40-70 Heart Healthy Diet         ? 40 Therapeutic Level Cholesterol Diet  Nutrition Goals Re-Evaluation:  Nutrition Goals Re-Evaluation    [place] Name 05/18/20 0954             Goals   Current Weight 141 lb 3.2 oz (64 kg)       Comment Shunte has not met with Melissa, our RD as of yet. Patient is interested in speaking with her and will plan to set up a tine for the future,       Expected Outcome Short: Meet with RD to discuss nutrition goals Long: Maintain eating a heart healthy diet              Nutrition Goals Discharge (Final Nutrition Goals Re-Evaluation):  Nutrition Goals Re-Evaluation - 05/18/20 0954      Goals   Current Weight 141 lb 3.2 oz (64 kg)    Comment Cherita has not met with Lenna Sciara, our RD as of yet. Patient is interested in speaking with her and will plan to set up a tine for the future,    Expected Outcome Short: Meet with RD to discuss nutrition goals Long: Maintain eating a heart healthy diet           Psychosocial: Target Goals: Acknowledge presence or absence of significant depression and/or stress, maximize coping skills, provide positive support system. Participant is able to verbalize types and ability to use techniques and skills needed for reducing stress and depression.   Education: Depression - Provides group verbal and written instruction on the correlation between heart/lung disease and depressed mood, treatment options, and the stigmas associated with seeking treatment.   Education: Sleep  Hygiene -Provides group verbal and written instruction about how sleep can affect your health.  Define sleep hygiene, discuss sleep cycles and impact of sleep habits. Review good sleep hygiene tips.  Education: Stress and Anxiety: - Provides group verbal and written instruction about the health risks of elevated stress and causes of high stress.  Discuss the correlation between heart/lung disease and anxiety and treatment options. Review healthy ways to manage with stress and anxiety.    Initial Review & Psychosocial Screening:  Initial Psych Review & Screening - 04/07/20 1331      Initial Review   Current issues with None Identified      Family Dynamics   Good Support System? Yes    Comments She can look to her children, daughter, mom and dad for support. Overall she is a positive person but she her husband had passed away last year.      Barriers   Psychosocial barriers to participate in program There are no identifiable barriers or psychosocial needs.;The patient should benefit from training in stress management and relaxation.      Screening Interventions   Interventions Encouraged to exercise;Program counselor consult;Provide feedback about the scores to participant;To provide support and resources with identified psychosocial needs    Expected Outcomes Short Term goal: Utilizing psychosocial counselor, staff and physician to assist with identification of specific Stressors or current issues interfering with healing process. Setting desired goal for each stressor or current issue identified.;Long Term Goal: Stressors or current issues are controlled or eliminated.;Short Term goal: Identification and review with participant of any Quality of Life or Depression concerns found by scoring the questionnaire.;Long Term goal: The participant improves quality of Life and PHQ9 Scores as seen by post scores and/or verbalization of changes           Quality of Life Scores:   Quality of  Life - 04/11/20 1449      Quality of Life   Select Quality of Life      Quality of Life Scores   Health/Function Pre 22.39 %    Socioeconomic Pre 22.5 %    Psych/Spiritual Pre 21.71 %    Family Pre 25.5 %          Scores of 19 and below usually indicate a poorer quality of life in these areas.  A difference of  2-3 points is a clinically meaningful difference.  A difference of 2-3 points in the total score of the Quality of Life Index has been associated with significant improvement in overall quality of life, self-image, physical symptoms, and general health in studies assessing change in quality of life.  PHQ-9: Recent Review Flowsheet Data    Depression screen [facility] 2/9 04/11/2020 10/20/2019   Decreased Interest 0 0   Down, Depressed, Hopeless 0 0   PHQ - 2 Score 0 0   Altered sleeping 1 -   Tired, decreased energy 1 -   Change in appetite 0 -   Feeling bad or failure about yourself  0 -   Trouble concentrating 0 -   Moving slowly or fidgety/restless 0 -   Suicidal thoughts 0 -   PHQ-9 Score 2 -   Difficult doing work/chores Not difficult at all -     Interpretation of Total Score  Total Score Depression Severity:  1-4 = Minimal depression, 5-9 = Mild depression, 10-14 = Moderate depression, 15-19 = Moderately severe depression, 20-27 = Severe depression   Psychosocial Evaluation and Intervention:  Psychosocial Evaluation - 04/07/20 1333      Psychosocial Evaluation & Interventions   Interventions Encouraged to exercise with the program and follow exercise prescription    Comments She can look to her children,  daughter, mom and dad for support. Overall she is a positive person but she her husband had passed away last year.    Expected Outcomes Short: start Cardiac rehab to improve mood. Long: keep stress at a minimum.    Continue Psychosocial Services  Follow up required by staff           Psychosocial Re-Evaluation:  Psychosocial Re-Evaluation    [place] Name 05/18/20  [phone] 06/13/20 0933           Psychosocial Re-Evaluation   Current issues with Current Stress Concerns Current Stress Concerns      Comments Jannel reports she has been holding up mentally very well.  She keeps busy gardening and has great support from her kids/family. She enjoys giving herself projects to do to stay busy. Armina is maintaining a positive attitude and is hopeful for a good year. Samreet celebrated her 59th birthday last weekend with family and friends.  She feels she is doing well mentally.  She is living with her son (and dog) and that is going well.      Expected Outcomes Short: Continue to exercise for mental health benefits Long: Maintain positive attitude, avoid big stressors Short: continue to exercise Long: maintain positive outlook      Continue Psychosocial Services  Follow up required by staff --             Psychosocial Discharge (Final Psychosocial Re-Evaluation):  Psychosocial Re-Evaluation - 06/13/20 0933      Psychosocial Re-Evaluation   Current issues with Current Stress Concerns    Comments Markala celebrated her 59th birthday last weekend with family and friends.  She feels she is doing well mentally.  She is living with her son (and dog) and that is going well.    Expected Outcomes Short: continue to exercise Long: maintain positive outlook           Vocational Rehabilitation: Provide vocational rehab assistance to qualifying candidates.   Vocational Rehab Evaluation & Intervention:   Education: Education Goals: Education classes will be provided on a variety of topics geared toward better understanding of heart health and risk factor modification. Participant will state understanding/return demonstration of topics presented as noted by education test scores.  Learning Barriers/Preferences:  Learning Barriers/Preferences - 04/07/20 1334      Learning Barriers/Preferences   Learning Barriers None    Learning Preferences None            General Cardiac Education Topics:  AED/CPR: - Group verbal and written instruction with the use of models to demonstrate the basic use of the AED with the basic ABC's of resuscitation.   Anatomy & Physiology of the Heart: - Group verbal and written instruction and models provide basic cardiac anatomy and physiology, with the coronary electrical and arterial systems. Review of Valvular disease and Heart Failure   Cardiac Procedures: - Group verbal and written instruction to review commonly prescribed medications for heart disease. Reviews the medication, class of the drug, and side effects. Includes the steps to properly store meds and maintain the prescription regimen. (beta blockers and nitrates)   Cardiac Medications I: - Group verbal and written instruction to review commonly prescribed medications for heart disease. Reviews the medication, class of the drug, and side effects. Includes the steps to properly store meds and maintain the prescription regimen.   Cardiac Medications II: -Group verbal and written instruction to review commonly prescribed medications for heart disease. Reviews the medication, class of the drug, and side effects. (  all other drug classes)    Go Sex-Intimacy & Heart Disease, Get SMART - Goal Setting: - Group verbal and written instruction through game format to discuss heart disease and the return to sexual intimacy. Provides group verbal and written material to discuss and apply goal setting through the application of the S.M.A.R.T. Method.   Other Matters of the Heart: - Provides group verbal, written materials and models to describe Stable Angina and Peripheral Artery. Includes description of the disease process and treatment options available to the cardiac patient.   Infection Prevention: - Provides verbal and written material to individual with discussion of infection control including proper hand washing and proper equipment cleaning during  exercise session.   Cardiac Rehab from 04/11/2020 in [facility] Cardiac and Pulmonary Rehab  Date 04/11/20  Educator AS  Instruction Review Code 1- Verbalizes Understanding      Falls Prevention: - Provides verbal and written material to individual with discussion of falls prevention and safety.   Cardiac Rehab from 04/11/2020 in [facility] Cardiac and Pulmonary Rehab  Date 04/11/20  Educator AS  Instruction Review Code 1- Verbalizes Understanding      Other: -Provides group and verbal instruction on various topics (see comments)   Knowledge Questionnaire Score:  Knowledge Questionnaire Score - 04/11/20 1449      Knowledge Questionnaire Score   Pre Score 25/26           Core Components/Risk Factors/Patient Goals at Admission:  Personal Goals and Risk Factors at Admission - 04/11/20 1452      Core Components/Risk Factors/Patient Goals on Admission    Weight Management Yes    Intervention Weight Management: Develop a combined nutrition and exercise program designed to reach desired caloric intake, while maintaining appropriate intake of nutrient and fiber, sodium and fats, and appropriate energy expenditure required for the weight goal.;Weight Management: Provide education and appropriate resources to help participant work on and attain dietary goals.;Weight Management/Obesity: Establish reasonable short term and long term weight goals.    Admit Weight 143 lb 14.4 oz (65.3 kg)    Goal Weight: Short Term 143 lb (64.9 kg)    Goal Weight: Long Term 143 lb (64.9 kg)    Expected Outcomes Weight Maintenance: Understanding of the daily nutrition guidelines, which includes 25-35% calories from fat, 7% or less cal from saturated fats, less than '200mg'$  cholesterol, less than 1.5gm of sodium, & 5 or more servings of fruits and vegetables daily;Long Term: Adherence to nutrition and physical activity/exercise program aimed toward attainment of established weight goal;Short Term: Continue to assess and  modify interventions until short term weight is achieved;Understanding recommendations for meals to include 15-35% energy as protein, 25-35% energy from fat, 35-60% energy from carbohydrates, less than '200mg'$  of dietary cholesterol, 20-35 gm of total fiber daily;Understanding of distribution of calorie intake throughout the day with the consumption of 4-5 meals/snacks    Diabetes Yes    Intervention Provide education about signs/symptoms and action to take for hypo/hyperglycemia.;Provide education about proper nutrition, including hydration, and aerobic/resistive exercise prescription along with prescribed medications to achieve blood glucose in normal ranges: Fasting glucose 65-99 mg/dL    Expected Outcomes Short Term: Participant verbalizes understanding of the signs/symptoms and immediate care of hyper/hypoglycemia, proper foot care and importance of medication, aerobic/resistive exercise and nutrition plan for blood glucose control.;Long Term: Attainment of HbA1C < 7%.    Intervention Provide education on lifestyle modifcations including regular physical activity/exercise, weight management, moderate sodium restriction and increased consumption of fresh fruit, vegetables,  and low fat dairy, alcohol moderation, and smoking cessation.;Monitor prescription use compliance.    Expected Outcomes Short Term: Continued assessment and intervention until BP is < 140/25m HG in hypertensive participants. < 130/833mHG in hypertensive participants with diabetes, heart failure or chronic kidney disease.;Long Term: Maintenance of blood pressure at goal levels.    Intervention Provide education and support for participant on nutrition & aerobic/resistive exercise along with prescribed medications to achieve LDL '70mg'$ , HDL >'40mg'$ .    Expected Outcomes Short Term: Participant states understanding of desired cholesterol values and is compliant with medications prescribed. Participant is following exercise prescription and  nutrition guidelines.;Long Term: Cholesterol controlled with medications as prescribed, with individualized exercise RX and with personalized nutrition plan. Value goals: LDL < '70mg'$ , HDL > 40 mg.           Education:Diabetes - Individual verbal and written instruction to review signs/symptoms of diabetes, desired ranges of glucose level fasting, after meals and with exercise. Acknowledge that pre and post exercise glucose checks will be done for 3 sessions at entry of program.   [facility]  Date 04/07/20  Educator JHLegacy Transplant ServicesInstruction Review Code 1- Verbalizes Understanding      Education: Know Your Numbers and Risk Factors: -Group verbal and written instruction about important numbers in your health.  Discussion of what are risk factors and how they play a role in the disease process.  Review of Cholesterol, Blood Pressure, Diabetes, and BMI and the role they play in your overall health.   Core Components/Risk Factors/Patient Goals Review:   Goals and Risk Factor Review    Row Name 05/18/20 0941 06/13/20 0931           Core Components/Risk Factors/Patient Goals Review   Personal Goals Review Stress;Diabetes;Hypertension;Lipids Stress;Diabetes;Hypertension;Lipids      Review Lovell's BP's have been stable, ranging in about 11841or systolic. KaBaileaays her weight has been maintaining and she is focusing on eating a heart healthy diet, She still has the big stress of the passing of her husband which happened recently this past year. She has great support from her son and daughter and other family. Continues to maintain normal blood glucose levels and monitors it at home. She is due to get an updated lipid panel at the end of July. KaKathlineas been monitoring BG and BP at home and all are staying in range her Dr recommends.  She eats heart healthy most of the time.      Expected Outcomes Short: Focus on stress management, continue regular  attendance to rehab Long: Manage risk factors Short:  continue to monitor risk factors Long:  manage risk factors             Core Components/Risk Factors/Patient Goals at Discharge (Final Review):   Goals and Risk Factor Review - 06/13/20 0931      Core Components/Risk Factors/Patient Goals Review   Personal Goals Review Stress;Diabetes;Hypertension;Lipids    Review KaKarlynnas been monitoring BG and BP at home and all are staying in range her Dr recommends.  She eats heart healthy most of the time.    Expected Outcomes Short:  continue to monitor risk factors Long:  manage risk factors           ITP Comments:  ITP Comments    Row Name 04/07/20 1337 04/11/20 1455 04/15/20 1117 05/04/20 0643 06/01/20 1107   ITP Comments Virtual Visit completed. Patient informed on EP and RD  appointment and 6 Minute walk test. Patient also informed of patient health questionnaires on My Chart. Patient Verbalizes understanding. Visit diagnosis can be found in [facility] 04/01/2020. Completed 6MWT and gym orientation.  Initial ITP created and sent for review to Dr. Emily Filbert, Medical Director. First full day of exercise!  Patient was oriented to gym and equipment including functions, settings, policies, and procedures.  Patient's individual exercise prescription and treatment plan were reviewed.  All starting workloads were established based on the results of the 6 minute walk test done at initial orientation visit.  The plan for exercise progression was also introduced and progression will be customized based on patient's performance and goals. 30 Day review completed. Medical Director ITP review done, changes made as directed, and signed approval by Medical Director. 30 Day review completed. Medical Director ITP review done, changes made as directed, and signed approval by Medical Director.   [place] Name 06/29/20 0731           ITP Comments 30 Day review completed. Medical Director ITP review done, changes made as  directed, and signed approval by Medical Director.              Comments:

## 2020-07-04 ENCOUNTER — Encounter: Payer: Self-pay | Admitting: *Deleted

## 2020-07-04 ENCOUNTER — Telehealth: Payer: Self-pay | Admitting: *Deleted

## 2020-07-04 DIAGNOSIS — I213 ST elevation (STEMI) myocardial infarction of unspecified site: Secondary | ICD-10-CM

## 2020-07-04 NOTE — Progress Notes (Signed)
Discharge Progress Report  Patient Details  Name: Melissa Lamb MRN: [id number] Date of Birth: [birth year] Referring Provider:     Cardiac Rehab from 04/11/2020 in [facility] Cardiac and Pulmonary Rehab  Referring Provider [facility]       Number of Visits: 24  Reason for Discharge:  Early Exit:  Personal  Smoking History:  Social History   Tobacco Use  Smoking Status Never Smoker  Smokeless Tobacco Never Used    Diagnosis:  ST elevation myocardial infarction (STEMI), unspecified artery ([place])  ADL UCSD:   Initial Exercise Prescription:  Initial Exercise Prescription - 04/11/20 1400      Date of Initial Exercise RX and Referring Provider   Date 04/11/20    Referring Provider [facility]      Treadmill   MPH 2.5    Grade 3    Minutes 15    METs 4      Recumbant Bike   Level 3    RPM 60    Watts 41    Minutes 15    METs 4      NuStep   Level 3    SPM 80    Minutes 15    METs 4      REL-XR   Level 4    Speed 50    Minutes 15    METs 4      T5 Nustep   Level 2    SPM 80    Minutes 15    METs 4      Prescription Details   Frequency (times per week) 3      Intensity   THRR 40-80% of Max Heartrate 111-144    Ratings of Perceived Exertion 11-15    Perceived Dyspnea 0-4      Resistance Training   Training Prescription Yes    Weight 3 lb    Reps 10-15           Discharge Exercise Prescription (Final Exercise Prescription Changes):  Exercise Prescription Changes - 06/22/20 1100      Response to Exercise   Blood Pressure (Admit) 130/64    Blood Pressure (Exercise) 142/74    Blood Pressure (Exit) 124/72    Heart Rate (Admit) 76 bpm    Heart Rate (Exercise) 108 bpm    Heart Rate (Exit) 82 bpm    Rating of Perceived Exertion (Exercise) 12    Symptoms none    Duration Progress to 30 minutes of  aerobic without signs/symptoms of physical distress    Intensity THRR unchanged      Progression   Progression Continue to progress workloads to  maintain intensity without signs/symptoms of physical distress.    Average METs 4.67      Resistance Training   Training Prescription Yes    Weight 3 lb    Reps 10-15      Interval Training   Interval Training No      Treadmill   MPH 3.4    Grade 3.5    Minutes 15    METs 5.24      REL-XR   Level 4    Minutes 15    METs 2.3           Functional Capacity:  6 Minute Walk    Row Name 04/11/20 1436         6 Minute Walk   Phase Initial     Distance 1300 feet     Walk Time 6 minutes     #  of Rest Breaks 0     MPH 2.46     METS 3.99     RPE 11     Perceived Dyspnea  1     VO2 Peak 13.96     Symptoms No     Resting HR 78 bpm     Resting BP 118/64     Resting Oxygen Saturation  98 %     Exercise Oxygen Saturation  during 6 min walk 100 %     Max Ex. HR 115 bpm     Max Ex. BP 130/62     2 Minute Post BP 122/64            Psychological, QOL, Others - Outcomes: PHQ 2/9: Depression screen [facility] 2/9 04/11/2020 10/20/2019  Decreased Interest 0 0  Down, Depressed, Hopeless 0 0  PHQ - 2 Score 0 0  Altered sleeping 1 -  Tired, decreased energy 1 -  Change in appetite 0 -  Feeling bad or failure about yourself  0 -  Trouble concentrating 0 -  Moving slowly or fidgety/restless 0 -  Suicidal thoughts 0 -  PHQ-9 Score 2 -  Difficult doing work/chores Not difficult at all -      Nutrition & Weight - Outcomes:  Pre Biometrics - 04/11/20 1448      Pre Biometrics   Height 5\' 8"  (1.727 m)    Weight 143 lb 14.4 oz (65.3 kg)    BMI (Calculated) 21.89    Single Leg Stand 30 seconds            Nutrition:  Nutrition Therapy & Goals - 06/15/20 0927      Nutrition Therapy   Diet Heart healthy, Low Na, T2DM    Protein (specify units) 55g    Fiber 25 grams    Whole Grain Foods 3 servings    Saturated Fats 12 max. grams    Fruits and Vegetables 5 servings/day    Sodium 1.5 grams      Personal Nutrition Goals   Nutrition Goal ST: read labels to ensure  managing sodium, variety  LT: continue with current changes    Comments Usually cooks. B: Almond milk muffin with butter (usually does peanut butter). L: salad D: protein with vegetables like broccoli or asparagus. uses a variety of protein foods such as meat, fish, and chicken. Usually cooks with olive oil, uses salt, but goes light. Mixture of whole grains and refined grains. Tries not to snack and when she does she will have fruit or another healhty snack. BG is doing well around 130; 6.1 A1C. T2DM. Discussed heart healthy eating and T2DM eating.      Intervention Plan   Intervention Prescribe, educate and counsel regarding individualized specific dietary modifications aiming towards targeted core components such as weight, hypertension, lipid management, diabetes, heart failure and other comorbidities.;Nutrition handout(s) given to patient.    Expected Outcomes Short Term Goal: Understand basic principles of dietary content, such as calories, fat, sodium, cholesterol and nutrients.;Short Term Goal: A plan has been developed with personal nutrition goals set during dietitian appointment.;Long Term Goal: Adherence to prescribed nutrition plan.           Nutrition Discharge:  Nutrition Assessments - 04/11/20 1451      MEDFICTS Scores   Pre Score 18           Education Questionnaire Score:  Knowledge Questionnaire Score - 04/11/20 1449      Knowledge Questionnaire Score   Pre Score  25/26           Goals reviewed with patient; copy given to patient.

## 2020-07-04 NOTE — Telephone Encounter (Signed)
Melissa Lamb called to discharge from program.  She said that her knee is starting to bother her again.

## 2020-07-04 NOTE — Progress Notes (Signed)
Cardiac Individual Treatment Plan  Patient Details  Name: Taci Sterling  MRN: [id number] Date of Birth: 06/21/1961 Referring Provider:     Cardiac Rehab from 04/11/2020 in [facility] Cardiac and Pulmonary Rehab  Referring Provider [facility]      Initial Encounter Date:    Cardiac Rehab from 04/11/2020 in [facility] Cardiac and Pulmonary Rehab  Date 04/11/20      Visit Diagnosis: ST elevation myocardial infarction (STEMI), unspecified artery ([place])  Patient's Home Medications on Admission:  Current Outpatient Medications:  .  acetic acid-hydrocortisone (VOSOL-HC) OTIC solution, 2 (two) times daily., Disp: , Rfl:  .  aspirin 81 MG chewable tablet, Chew 1 tablet (81 mg total) by mouth daily. Need to take for at least a year., Disp: , Rfl:  .  Blood Glucose Monitoring Suppl (ACCU-CHEK AVIVA PLUS) w/Device KIT, USE TO CHECK BLOOD SUGAR ONCE DAILY, Disp: , Rfl:  .  Cholecalciferol (VITAMIN D-1000 MAX ST) 25 MCG (1000 UT) tablet, Take 2,000 Units by mouth daily. , Disp: , Rfl:  .  fenofibrate 160 MG tablet, Take 1 tablet (160 mg total) by mouth daily., Disp: 90 tablet, Rfl: 2 .  fluocinolone (SYNALAR) 0.01 % external solution, Apply topically 2 (two) times daily., Disp: , Rfl:  .  glipiZIDE (GLUCOTROL) 5 MG tablet, Take 1.5 tablets (7.5 mg total) by mouth daily., Disp: 45 tablet, Rfl: 5 .  glucose blood (ACCU-CHEK AVIVA PLUS) test strip, USE TO CHECK BLOOD SUGAR ONCE DAILY, Disp: , Rfl:  .  levothyroxine (SYNTHROID) 88 MCG tablet, Take 88 mcg by mouth daily. , Disp: , Rfl:  .  losartan (COZAAR) 25 MG tablet, Take 25 mg by mouth daily., Disp: , Rfl:  .  metFORMIN (GLUCOPHAGE) 500 MG tablet, Take 1 tablet (500 mg total) by mouth 2 (two) times daily., Disp: 60 tablet, Rfl: 5 .  metoprolol succinate (TOPROL-XL) 50 MG 24 hr tablet, Take 1 tablet (50 mg total) by mouth daily. Take with or immediately following a meal., Disp: 30 tablet, Rfl: 0 .  neomycin-polymyxin-hydrocortisone (CORTISPORIN) 3.5-10000-1  OTIC suspension, Place 3 drops into the right ear 4 (four) times daily., Disp: 10 mL, Rfl: 0 .  rosuvastatin (CRESTOR) 40 MG tablet, Take 1 tablet (40 mg total) by mouth daily., Disp: 30 tablet, Rfl: 2 .  ticagrelor (BRILINTA) 90 MG TABS tablet, Take 1 tablet (90 mg total) by mouth 2 (two) times daily. Need to take this for at least a year., Disp: 60 tablet, Rfl: 11  Past Medical History: Past Medical History:  Diagnosis Date  . Diabetes mellitus without complication ([place])   . History of chickenpox   . Hyperlipidemia   . Hypertension   . Thyroid cancer ([place])     Tobacco Use: Social History   Tobacco Use  Smoking Status Never Smoker  Smokeless Tobacco Never Used    Labs: Recent Review Flowsheet Data    Labs for ITP Cardiac and Pulmonary Rehab Latest Ref Rng & Units 07/10/2018 10/20/2019 02/22/2020 04/01/2020 04/02/2020   Cholestrol 0 - 200 mg/dL 250(A) - - - 182   LDLCALC 0 - 99 mg/dL 144 - - - 96   HDL >40 mg/dL 31(A) - - - 29(L)   Trlycerides <150 mg/dL 375(A) - - - 284(H)   Hemoglobin A1c 4.8 - 5.6 % 6.0 5.8(A) 6.1(A) 6.0(H) -       Exercise Target Goals: Exercise Program Goal: Individual exercise prescription set using results from initial 6 min walk test and THRR while considering  patient's activity barriers  and safety.   Exercise Prescription Goal: Initial exercise prescription builds to 30-45 minutes a day of aerobic activity, 2-3 days per week.  Home exercise guidelines will be given to patient during program as part of exercise prescription that the participant will acknowledge.   Education: Aerobic Exercise & Resistance Training: - Gives group verbal and written instruction on the various components of exercise. Focuses on aerobic and resistive training programs and the benefits of this training and how to safely progress through these programs..   Education: Exercise & Equipment Safety: - Individual verbal instruction and demonstration of equipment use and safety  with use of the equipment.   Cardiac Rehab from 04/11/2020 in [facility] Cardiac and Pulmonary Rehab  Date 04/11/20  Educator AS  Instruction Review Code 1- Verbalizes Understanding      Education: Exercise Physiology & General Exercise Guidelines: - Group verbal and written instruction with models to review the exercise physiology of the cardiovascular system and associated critical values. Provides general exercise guidelines with specific guidelines to those with heart or lung disease.    Education: Flexibility, Balance, Mind/Body Relaxation: Provides group verbal/written instruction on the benefits of flexibility and balance training, including mind/body exercise modes such as yoga, pilates and tai chi.  Demonstration and skill practice provided.   Activity Barriers & Risk Stratification:   6 Minute Walk:  6 Minute Walk    Row Name 04/11/20 1436         6 Minute Walk   Phase Initial     Distance 1300 feet     Walk Time 6 minutes     # of Rest Breaks 0     MPH 2.46     METS 3.99     RPE 11     Perceived Dyspnea  1     VO2 Peak 13.96     Symptoms No     Resting HR 78 bpm     Resting BP 118/64     Resting Oxygen Saturation  98 %     Exercise Oxygen Saturation  during 6 min walk 100 %     Max Ex. HR 115 bpm     Max Ex. BP 130/62     2 Minute Post BP 122/64            Oxygen Initial Assessment:   Oxygen Re-Evaluation:   Oxygen Discharge (Final Oxygen Re-Evaluation):   Initial Exercise Prescription:  Initial Exercise Prescription - 04/11/20 1400      Date of Initial Exercise RX and Referring Provider   Date 04/11/20    Referring Provider [facility]      Treadmill   MPH 2.5    Grade 3    Minutes 15    METs 4      Recumbant Bike   Level 3    RPM 60    Watts 41    Minutes 15    METs 4      NuStep   Level 3    SPM 80    Minutes 15    METs 4      REL-XR   Level 4    Speed 50    Minutes 15    METs 4      T5 Nustep   Level 2    SPM 80     Minutes 15    METs 4      Prescription Details   Frequency (times per week) 3      Intensity  THRR 40-80% of Max Heartrate 111-144    Ratings of Perceived Exertion 11-15    Perceived Dyspnea 0-4      Resistance Training   Training Prescription Yes    Weight 3 lb    Reps 10-15           Perform Capillary Blood Glucose checks as needed.  Exercise Prescription Changes:  Exercise Prescription Changes    Row Name 04/11/20 1400 04/25/20 1600 05/10/20 1400 05/24/20 1400 06/07/20 1300     Response to Exercise   Blood Pressure (Admit) 118/64 116/72 102/60 126/62 110/62   Blood Pressure (Exercise) 130/62 128/70 130/70 134/68 124/74   Blood Pressure (Exit) 122/64 120/62 122/70 120/60 110/60   Heart Rate (Admit) 78 bpm 76 bpm 71 bpm 75 bpm 78 bpm   Heart Rate (Exercise) 115 bpm 112 bpm 113 bpm 113 bpm 109 bpm   Heart Rate (Exit) 70 bpm 85 bpm 77 bpm 78 bpm 82 bpm   Oxygen Saturation (Admit) 98 % -- -- -- --   Oxygen Saturation (Exercise) 100 % -- -- -- --   Rating of Perceived Exertion (Exercise) '11 12 12 13 13   '$ Perceived Dyspnea (Exercise) 1 -- -- -- --   Symptoms none none none none none   Comments -- third full day of exercise -- -- --   Duration -- Continue with 30 min of aerobic exercise without signs/symptoms of physical distress. Continue with 30 min of aerobic exercise without signs/symptoms of physical distress. Continue with 30 min of aerobic exercise without signs/symptoms of physical distress. Continue with 30 min of aerobic exercise without signs/symptoms of physical distress.   Intensity -- THRR unchanged THRR unchanged THRR unchanged THRR unchanged     Progression   Progression -- Continue to progress workloads to maintain intensity without signs/symptoms of physical distress. Continue to progress workloads to maintain intensity without signs/symptoms of physical distress. Continue to progress workloads to maintain intensity without signs/symptoms of physical distress.  Continue to progress workloads to maintain intensity without signs/symptoms of physical distress.   Average METs -- 3.95 4.2 3.76 3.85     Resistance Training   Training Prescription -- Yes Yes Yes Yes   Weight -- 3 lb 3 lb 3 lb 3 lb   Reps -- 10-15 10-15 10-15 10-15     Interval Training   Interval Training -- No No No No     Treadmill   MPH -- 2.'8 3 3 '$ 3.2   Grade -- 3 3.5 3.5 3.5   Minutes -- '15 15 15 15   '$ METs -- 4.3 4.3 3.75 4.99     Elliptical   Level -- -- -- 2 --   Speed -- -- -- 2.3 --   Minutes -- -- -- 15 --   METs -- -- -- 2.6 --     REL-XR   Level -- '4 4 4 4   '$ Speed -- -- 50 -- 50   Minutes -- '15 15 15 15   '$ METs -- 3.6 4.1 3.8 2.7   Row Name 06/22/20 1100             Response to Exercise   Blood Pressure (Admit) 130/64       Blood Pressure (Exercise) 142/74       Blood Pressure (Exit) 124/72       Heart Rate (Admit) 76 bpm       Heart Rate (Exercise) 108 bpm       Heart Rate (Exit) 82  bpm       Rating of Perceived Exertion (Exercise) 12       Symptoms none       Duration Progress to 30 minutes of  aerobic without signs/symptoms of physical distress       Intensity THRR unchanged         Progression   Progression Continue to progress workloads to maintain intensity without signs/symptoms of physical distress.       Average METs 4.67         Resistance Training   Training Prescription Yes       Weight 3 lb       Reps 10-15         Interval Training   Interval Training No         Treadmill   MPH 3.4       Grade 3.5       Minutes 15       METs 5.24         REL-XR   Level 4       Minutes 15       METs 2.3              Exercise Comments:  Exercise Comments    Row Name 04/15/20 1118           Exercise Comments First full day of exercise!  Patient was oriented to gym and equipment including functions, settings, policies, and procedures.  Patient's individual exercise prescription and treatment plan were reviewed.  All starting  workloads were established based on the results of the 6 minute walk test done at initial orientation visit.  The plan for exercise progression was also introduced and progression will be customized based on patient's performance and goals.              Exercise Goals and Review:  Exercise Goals    Row Name 04/11/20 1442             Exercise Goals   Increase Physical Activity Yes       Intervention Provide advice, education, support and counseling about physical activity/exercise needs.;Develop an individualized exercise prescription for aerobic and resistive training based on initial evaluation findings, risk stratification, comorbidities and participant's personal goals.       Expected Outcomes Short Term: Attend rehab on a regular basis to increase amount of physical activity.;Long Term: Add in home exercise to make exercise part of routine and to increase amount of physical activity.;Long Term: Exercising regularly at least 3-5 days a week.       Increase Strength and Stamina Yes       Intervention Provide advice, education, support and counseling about physical activity/exercise needs.;Develop an individualized exercise prescription for aerobic and resistive training based on initial evaluation findings, risk stratification, comorbidities and participant's personal goals.       Expected Outcomes Short Term: Increase workloads from initial exercise prescription for resistance, speed, and METs.;Short Term: Perform resistance training exercises routinely during rehab and add in resistance training at home;Long Term: Improve cardiorespiratory fitness, muscular endurance and strength as measured by increased METs and functional capacity (6MWT)       Able to understand and use rate of perceived exertion (RPE) scale Yes       Intervention Provide education and explanation on how to use RPE scale       Expected Outcomes Short Term: Able to use RPE daily in rehab to express subjective intensity  level;Long Term:  Able  to use RPE to guide intensity level when exercising independently       Knowledge and understanding of Target Heart Rate Range (THRR) Yes       Intervention Provide education and explanation of THRR including how the numbers were predicted and where they are located for reference       Expected Outcomes Short Term: Able to state/look up THRR;Short Term: Able to use daily as guideline for intensity in rehab;Long Term: Able to use THRR to govern intensity when exercising independently       Able to check pulse independently Yes       Intervention Provide education and demonstration on how to check pulse in carotid and radial arteries.;Review the importance of being able to check your own pulse for safety during independent exercise       Expected Outcomes Short Term: Able to explain why pulse checking is important during independent exercise;Long Term: Able to check pulse independently and accurately       Understanding of Exercise Prescription Yes       Intervention Provide education, explanation, and written materials on patient's individual exercise prescription       Expected Outcomes Short Term: Able to explain program exercise prescription;Long Term: Able to explain home exercise prescription to exercise independently              Exercise Goals Re-Evaluation :  Exercise Goals Re-Evaluation    Row Name 04/15/20 1119 04/25/20 1600 05/10/20 1431 05/18/20 0934 05/24/20 1407     Exercise Goal Re-Evaluation   Exercise Goals Review Knowledge and understanding of Target Heart Rate Range (THRR);Able to understand and use rate of perceived exertion (RPE) scale;Understanding of Exercise Prescription Increase Strength and Stamina;Increase Physical Activity;Understanding of Exercise Prescription Increase Strength and Stamina;Increase Physical Activity;Understanding of Exercise Prescription Increase Strength and Stamina;Increase Physical Activity;Understanding of Exercise  Prescription Increase Strength and Stamina;Increase Physical Activity;Understanding of Exercise Prescription   Comments Reviewed RPE and dyspnea scales, THR and program prescription with pt today.  Pt voiced understanding and was given a copy of goals to take home. Shraddha is off to a good start in rehab.  She has completed her first three full days of exercise and is already up to 2.8 mph on the treadmill.  We will continue to monitor her progress. Yocheved has increased workloads on TM and XR.  She reaches THR range on TM, but needs to increase level on XR.  Staff will monitor progress. Loida has been doing well. She is walking at home on her off days, 30-45 minutes, each time and an extra 2x/week,  reports no issues. She also uses handweights at home, although states she does not do it as much as she would like to. Encouraged to incorporate more hand weights along with her endurance exercise. Continues to take her BP and HR at home and reports normal ranges. Cailee is doing well in ehab.  She is up to level 2 on the ellipitcal now!  We will conitnue to monitor her progress.   Expected Outcomes Short: Use RPE daily to regulate intensity. Long: Follow program prescription in THR. Short: Continue to attend rehab regularly Long: Continue to follow program prescription. Short: Continue to attend rehab regularly Long: Continue to follow program prescription. Short: Continue to incorporate more hand weights Long: Increase strength/stamina, progress overall MET level Short: Talk about adding in intervals on seated equipment Long; Continue to improve stamina.   [place] Name 06/07/20 1316 06/13/20 0936 06/22/20 1120  Exercise Goal Re-Evaluation   Exercise Goals Review Increase Strength and Stamina;Increase Physical Activity;Understanding of Exercise Prescription Increase Strength and Stamina;Increase Physical Activity;Understanding of Exercise Prescription Increase Strength and Stamina;Increase Physical  Activity;Understanding of Exercise Prescription     Comments Isebella continues to progress well and is gradually increasing levels. Hibba is walking outside on some days not at [facility]  She has a fitness center in her neighborhood she can use if needed.  We discussed interval trining on seated machines Jaeanna is doing well in rehab. She has increased her speed on the treadmill. She is maintaining appropriate THR and RPE scale. Will continue to monitor.     Expected Outcomes Short: try intervals Long: increase overall MET level Short:  add intervals on XR Long: increase stamina Short: Continue attending rehab Long: Progress MET level and increase workloads            Discharge Exercise Prescription (Final Exercise Prescription Changes):  Exercise Prescription Changes - 06/22/20 1100      Response to Exercise   Blood Pressure (Admit) 130/64    Blood Pressure (Exercise) 142/74    Blood Pressure (Exit) 124/72    Heart Rate (Admit) 76 bpm    Heart Rate (Exercise) 108 bpm    Heart Rate (Exit) 82 bpm    Rating of Perceived Exertion (Exercise) 12    Symptoms none    Duration Progress to 30 minutes of  aerobic without signs/symptoms of physical distress    Intensity THRR unchanged      Progression   Progression Continue to progress workloads to maintain intensity without signs/symptoms of physical distress.    Average METs 4.67      Resistance Training   Training Prescription Yes    Weight 3 lb    Reps 10-15      Interval Training   Interval Training No      Treadmill   MPH 3.4    Grade 3.5    Minutes 15    METs 5.24      REL-XR   Level 4    Minutes 15    METs 2.3           Nutrition:  Target Goals: Understanding of nutrition guidelines, daily intake of sodium '1500mg'$ , cholesterol '200mg'$ , calories 30% from fat and 7% or less from saturated fats, daily to have 5 or more servings of fruits and vegetables.  Education: Controlling Sodium/Reading Food Labels -Group verbal and  written material supporting the discussion of sodium use in heart healthy nutrition. Review and explanation with models, verbal and written materials for utilization of the food label.   Education: General Nutrition Guidelines/Fats and Fiber: -Group instruction provided by verbal, written material, models and posters to present the general guidelines for heart healthy nutrition. Gives an explanation and review of dietary fats and fiber.   Biometrics:  Pre Biometrics - 04/11/20 1448      Pre Biometrics   Height '5\' 8"'$  (1.727 m)    Weight 143 lb 14.4 oz (65.3 kg)    BMI (Calculated) 21.89    Single Leg Stand 30 seconds            Nutrition Therapy Plan and Nutrition Goals:  Nutrition Therapy & Goals - 06/15/20 0927      Nutrition Therapy   Diet Heart healthy, Low Na, T2DM    Protein (specify units) 55g    Fiber 25 grams    Whole Grain Foods 3 servings    Saturated Fats 12 max. grams  Fruits and Vegetables 5 servings/day    Sodium 1.5 grams      Personal Nutrition Goals   Nutrition Goal ST: read labels to ensure managing sodium, variety  LT: continue with current changes    Comments Usually cooks. B: Almond milk muffin with butter (usually does peanut butter). L: salad D: protein with vegetables like broccoli or asparagus. uses a variety of protein foods such as meat, fish, and chicken. Usually cooks with olive oil, uses salt, but goes light. Mixture of whole grains and refined grains. Tries not to snack and when she does she will have fruit or another healhty snack. BG is doing well around 130; 6.1 A1C. T2DM. Discussed heart healthy eating and T2DM eating.      Intervention Plan   Intervention Prescribe, educate and counsel regarding individualized specific dietary modifications aiming towards targeted core components such as weight, hypertension, lipid management, diabetes, heart failure and other comorbidities.;Nutrition handout(s) given to patient.    Expected Outcomes Short  Term Goal: Understand basic principles of dietary content, such as calories, fat, sodium, cholesterol and nutrients.;Short Term Goal: A plan has been developed with personal nutrition goals set during dietitian appointment.;Long Term Goal: Adherence to prescribed nutrition plan.           Nutrition Assessments:  Nutrition Assessments - 04/11/20 1451      MEDFICTS Scores   Pre Score 18           MEDIFICTS Score Key:          ?70 Need to make dietary changes          40-70 Heart Healthy Diet         ? 40 Therapeutic Level Cholesterol Diet  Nutrition Goals Re-Evaluation:  Nutrition Goals Re-Evaluation    [place] Name 05/18/20 0954             Goals   Current Weight 141 lb 3.2 oz (64 kg)       Comment Arnold has not met with Melissa, our RD as of yet. Patient is interested in speaking with her and will plan to set up a tine for the future,       Expected Outcome Short: Meet with RD to discuss nutrition goals Long: Maintain eating a heart healthy diet              Nutrition Goals Discharge (Final Nutrition Goals Re-Evaluation):  Nutrition Goals Re-Evaluation - 05/18/20 0954      Goals   Current Weight 141 lb 3.2 oz (64 kg)    Comment Cortlynn has not met with Lenna Sciara, our RD as of yet. Patient is interested in speaking with her and will plan to set up a tine for the future,    Expected Outcome Short: Meet with RD to discuss nutrition goals Long: Maintain eating a heart healthy diet           Psychosocial: Target Goals: Acknowledge presence or absence of significant depression and/or stress, maximize coping skills, provide positive support system. Participant is able to verbalize types and ability to use techniques and skills needed for reducing stress and depression.   Education: Depression - Provides group verbal and written instruction on the correlation between heart/lung disease and depressed mood, treatment options, and the stigmas associated with seeking  treatment.   Education: Sleep Hygiene -Provides group verbal and written instruction about how sleep can affect your health.  Define sleep hygiene, discuss sleep cycles and impact of sleep habits. Review good sleep hygiene tips.  Education: Stress and Anxiety: - Provides group verbal and written instruction about the health risks of elevated stress and causes of high stress.  Discuss the correlation between heart/lung disease and anxiety and treatment options. Review healthy ways to manage with stress and anxiety.    Initial Review & Psychosocial Screening:  Initial Psych Review & Screening - 04/07/20 1331      Initial Review   Current issues with None Identified      Family Dynamics   Good Support System? Yes    Comments She can look to her children, daughter, mom and dad for support. Overall she is a positive person but she her husband had passed away last year.      Barriers   Psychosocial barriers to participate in program There are no identifiable barriers or psychosocial needs.;The patient should benefit from training in stress management and relaxation.      Screening Interventions   Interventions Encouraged to exercise;Program counselor consult;Provide feedback about the scores to participant;To provide support and resources with identified psychosocial needs    Expected Outcomes Short Term goal: Utilizing psychosocial counselor, staff and physician to assist with identification of specific Stressors or current issues interfering with healing process. Setting desired goal for each stressor or current issue identified.;Long Term Goal: Stressors or current issues are controlled or eliminated.;Short Term goal: Identification and review with participant of any Quality of Life or Depression concerns found by scoring the questionnaire.;Long Term goal: The participant improves quality of Life and PHQ9 Scores as seen by post scores and/or verbalization of changes            Quality of Life Scores:   Quality of Life - 04/11/20 1449      Quality of Life   Select Quality of Life      Quality of Life Scores   Health/Function Pre 22.39 %    Socioeconomic Pre 22.5 %    Psych/Spiritual Pre 21.71 %    Family Pre 25.5 %          Scores of 19 and below usually indicate a poorer quality of life in these areas.  A difference of  2-3 points is a clinically meaningful difference.  A difference of 2-3 points in the total score of the Quality of Life Index has been associated with significant improvement in overall quality of life, self-image, physical symptoms, and general health in studies assessing change in quality of life.  PHQ-9: Recent Review Flowsheet Data    Depression screen [facility] 2/9 04/11/2020 10/20/2019   Decreased Interest 0 0   Down, Depressed, Hopeless 0 0   PHQ - 2 Score 0 0   Altered sleeping 1 -   Tired, decreased energy 1 -   Change in appetite 0 -   Feeling bad or failure about yourself  0 -   Trouble concentrating 0 -   Moving slowly or fidgety/restless 0 -   Suicidal thoughts 0 -   PHQ-9 Score 2 -   Difficult doing work/chores Not difficult at all -     Interpretation of Total Score  Total Score Depression Severity:  1-4 = Minimal depression, 5-9 = Mild depression, 10-14 = Moderate depression, 15-19 = Moderately severe depression, 20-27 = Severe depression   Psychosocial Evaluation and Intervention:  Psychosocial Evaluation - 04/07/20 1333      Psychosocial Evaluation & Interventions   Interventions Encouraged to exercise with the program and follow exercise prescription    Comments She can look to her children,  daughter, mom and dad for support. Overall she is a positive person but she her husband had passed away last year.    Expected Outcomes Short: start Cardiac rehab to improve mood. Long: keep stress at a minimum.    Continue Psychosocial Services  Follow up required by staff           Psychosocial Re-Evaluation:   Psychosocial Re-Evaluation    [place] Name 05/18/20 [phone] 06/13/20 0933           Psychosocial Re-Evaluation   Current issues with Current Stress Concerns Current Stress Concerns      Comments Kashawna reports she has been holding up mentally very well.  She keeps busy gardening and has great support from her kids/family. She enjoys giving herself projects to do to stay busy. Encarnacion is maintaining a positive attitude and is hopeful for a good year. Nya celebrated her 59th birthday last weekend with family and friends.  She feels she is doing well mentally.  She is living with her son (and dog) and that is going well.      Expected Outcomes Short: Continue to exercise for mental health benefits Long: Maintain positive attitude, avoid big stressors Short: continue to exercise Long: maintain positive outlook      Continue Psychosocial Services  Follow up required by staff --             Psychosocial Discharge (Final Psychosocial Re-Evaluation):  Psychosocial Re-Evaluation - 06/13/20 0933      Psychosocial Re-Evaluation   Current issues with Current Stress Concerns    Comments Luvia celebrated her 59th birthday last weekend with family and friends.  She feels she is doing well mentally.  She is living with her son (and dog) and that is going well.    Expected Outcomes Short: continue to exercise Long: maintain positive outlook           Vocational Rehabilitation: Provide vocational rehab assistance to qualifying candidates.   Vocational Rehab Evaluation & Intervention:   Education: Education Goals: Education classes will be provided on a variety of topics geared toward better understanding of heart health and risk factor modification. Participant will state understanding/return demonstration of topics presented as noted by education test scores.  Learning Barriers/Preferences:  Learning Barriers/Preferences - 04/07/20 1334      Learning Barriers/Preferences   Learning Barriers None     Learning Preferences None           General Cardiac Education Topics:  AED/CPR: - Group verbal and written instruction with the use of models to demonstrate the basic use of the AED with the basic ABC's of resuscitation.   Anatomy & Physiology of the Heart: - Group verbal and written instruction and models provide basic cardiac anatomy and physiology, with the coronary electrical and arterial systems. Review of Valvular disease and Heart Failure   Cardiac Procedures: - Group verbal and written instruction to review commonly prescribed medications for heart disease. Reviews the medication, class of the drug, and side effects. Includes the steps to properly store meds and maintain the prescription regimen. (beta blockers and nitrates)   Cardiac Medications I: - Group verbal and written instruction to review commonly prescribed medications for heart disease. Reviews the medication, class of the drug, and side effects. Includes the steps to properly store meds and maintain the prescription regimen.   Cardiac Medications II: -Group verbal and written instruction to review commonly prescribed medications for heart disease. Reviews the medication, class of the drug, and side effects. (  all other drug classes)    Go Sex-Intimacy & Heart Disease, Get SMART - Goal Setting: - Group verbal and written instruction through game format to discuss heart disease and the return to sexual intimacy. Provides group verbal and written material to discuss and apply goal setting through the application of the S.M.A.R.T. Method.   Other Matters of the Heart: - Provides group verbal, written materials and models to describe Stable Angina and Peripheral Artery. Includes description of the disease process and treatment options available to the cardiac patient.   Infection Prevention: - Provides verbal and written material to individual with discussion of infection control including proper hand washing  and proper equipment cleaning during exercise session.   Cardiac Rehab from 04/11/2020 in [facility] Cardiac and Pulmonary Rehab  Date 04/11/20  Educator AS  Instruction Review Code 1- Verbalizes Understanding      Falls Prevention: - Provides verbal and written material to individual with discussion of falls prevention and safety.   Cardiac Rehab from 04/11/2020 in [facility] Cardiac and Pulmonary Rehab  Date 04/11/20  Educator AS  Instruction Review Code 1- Verbalizes Understanding      Other: -Provides group and verbal instruction on various topics (see comments)   Knowledge Questionnaire Score:  Knowledge Questionnaire Score - 04/11/20 1449      Knowledge Questionnaire Score   Pre Score 25/26           Core Components/Risk Factors/Patient Goals at Admission:  Personal Goals and Risk Factors at Admission - 04/11/20 1452      Core Components/Risk Factors/Patient Goals on Admission    Weight Management Yes    Intervention Weight Management: Develop a combined nutrition and exercise program designed to reach desired caloric intake, while maintaining appropriate intake of nutrient and fiber, sodium and fats, and appropriate energy expenditure required for the weight goal.;Weight Management: Provide education and appropriate resources to help participant work on and attain dietary goals.;Weight Management/Obesity: Establish reasonable short term and long term weight goals.    Admit Weight 143 lb 14.4 oz (65.3 kg)    Goal Weight: Short Term 143 lb (64.9 kg)    Goal Weight: Long Term 143 lb (64.9 kg)    Expected Outcomes Weight Maintenance: Understanding of the daily nutrition guidelines, which includes 25-35% calories from fat, 7% or less cal from saturated fats, less than '200mg'$  cholesterol, less than 1.5gm of sodium, & 5 or more servings of fruits and vegetables daily;Long Term: Adherence to nutrition and physical activity/exercise program aimed toward attainment of established weight  goal;Short Term: Continue to assess and modify interventions until short term weight is achieved;Understanding recommendations for meals to include 15-35% energy as protein, 25-35% energy from fat, 35-60% energy from carbohydrates, less than '200mg'$  of dietary cholesterol, 20-35 gm of total fiber daily;Understanding of distribution of calorie intake throughout the day with the consumption of 4-5 meals/snacks    Diabetes Yes    Intervention Provide education about signs/symptoms and action to take for hypo/hyperglycemia.;Provide education about proper nutrition, including hydration, and aerobic/resistive exercise prescription along with prescribed medications to achieve blood glucose in normal ranges: Fasting glucose 65-99 mg/dL    Expected Outcomes Short Term: Participant verbalizes understanding of the signs/symptoms and immediate care of hyper/hypoglycemia, proper foot care and importance of medication, aerobic/resistive exercise and nutrition plan for blood glucose control.;Long Term: Attainment of HbA1C < 7%.    Intervention Provide education on lifestyle modifcations including regular physical activity/exercise, weight management, moderate sodium restriction and increased consumption of fresh fruit, vegetables,  and low fat dairy, alcohol moderation, and smoking cessation.;Monitor prescription use compliance.    Expected Outcomes Short Term: Continued assessment and intervention until BP is < 140/68m HG in hypertensive participants. < 130/863mHG in hypertensive participants with diabetes, heart failure or chronic kidney disease.;Long Term: Maintenance of blood pressure at goal levels.    Intervention Provide education and support for participant on nutrition & aerobic/resistive exercise along with prescribed medications to achieve LDL '70mg'$ , HDL >'40mg'$ .    Expected Outcomes Short Term: Participant states understanding of desired cholesterol values and is compliant with medications prescribed. Participant  is following exercise prescription and nutrition guidelines.;Long Term: Cholesterol controlled with medications as prescribed, with individualized exercise RX and with personalized nutrition plan. Value goals: LDL < '70mg'$ , HDL > 40 mg.           Education:Diabetes - Individual verbal and written instruction to review signs/symptoms of diabetes, desired ranges of glucose level fasting, after meals and with exercise. Acknowledge that pre and post exercise glucose checks will be done for 3 sessions at entry of program.   [facility]  Date 04/07/20  [facility]ruction Review Code 1- Verbalizes Understanding      Education: Know Your Numbers and Risk Factors: -Group verbal and written instruction about important numbers in your health.  Discussion of what are risk factors and how they play a role in the disease process.  Review of Cholesterol, Blood Pressure, Diabetes, and BMI and the role they play in your overall health.   Core Components/Risk Factors/Patient Goals Review:   Goals and Risk Factor Review    Row Name 05/18/20 0941 06/13/20 0931           Core Components/Risk Factors/Patient Goals Review   Personal Goals Review Stress;Diabetes;Hypertension;Lipids Stress;Diabetes;Hypertension;Lipids      Review Elverta's BP's have been stable, ranging in about 11213or systolic. KaGearldeneays her weight has been maintaining and she is focusing on eating a heart healthy diet, She still has the big stress of the passing of her husband which happened recently this past year. She has great support from her son and daughter and other family. Continues to maintain normal blood glucose levels and monitors it at home. She is due to get an updated lipid panel at the end of July. KaSiniyahas been monitoring BG and BP at home and all are staying in range her Dr recommends.  She eats heart healthy most of the time.      Expected Outcomes Short: Focus on  stress management, continue regular attendance to rehab Long: Manage risk factors Short:  continue to monitor risk factors Long:  manage risk factors             Core Components/Risk Factors/Patient Goals at Discharge (Final Review):   Goals and Risk Factor Review - 06/13/20 0931      Core Components/Risk Factors/Patient Goals Review   Personal Goals Review Stress;Diabetes;Hypertension;Lipids    Review KaEvelineas been monitoring BG and BP at home and all are staying in range her Dr recommends.  She eats heart healthy most of the time.    Expected Outcomes Short:  continue to monitor risk factors Long:  manage risk factors           ITP Comments:  ITP Comments    Row Name 04/07/20 1337 04/11/20 1455 04/15/20 1117 05/04/20 0643 06/01/20 1107   ITP Comments Virtual Visit completed. Patient informed on EP and RD  appointment and 6 Minute walk test. Patient also informed of patient health questionnaires on My Chart. Patient Verbalizes understanding. Visit diagnosis can be found in [facility] 04/01/2020. Completed 6MWT and gym orientation.  Initial ITP created and sent for review to Dr. Emily Filbert, Medical Director. First full day of exercise!  Patient was oriented to gym and equipment including functions, settings, policies, and procedures.  Patient's individual exercise prescription and treatment plan were reviewed.  All starting workloads were established based on the results of the 6 minute walk test done at initial orientation visit.  The plan for exercise progression was also introduced and progression will be customized based on patient's performance and goals. 30 Day review completed. Medical Director ITP review done, changes made as directed, and signed approval by Medical Director. 30 Day review completed. Medical Director ITP review done, changes made as directed, and signed approval by Medical Director.   [place] Name 06/29/20 0731 07/04/20 0819         ITP Comments 30 Day review completed.  Medical Director ITP review done, changes made as directed, and signed approval by Medical Director. [place] called to discharge from program.  She said that her knee is starting to bother her again.             Comments: Discharge ITP

## 2020-07-11 DIAGNOSIS — H6063 Unspecified chronic otitis externa, bilateral: Secondary | ICD-10-CM | POA: Diagnosis not present

## 2020-07-11 DIAGNOSIS — H6123 Impacted cerumen, bilateral: Secondary | ICD-10-CM | POA: Diagnosis not present

## 2020-08-19 ENCOUNTER — Other Ambulatory Visit: Payer: Self-pay

## 2020-08-30 ENCOUNTER — Telehealth: Payer: Self-pay

## 2020-08-30 NOTE — Telephone Encounter (Signed)
Voice message has been left for patient regarding Aspirin and Brilinta prior to colonoscopy scheduled on Monday 10/24/20 with Dr. Marius Ditch.  Patient has been advised to stop Aspirin and Brilinta 3 days prior to colonoscopy.  Restart 1 day after.  Cardiac Clearance granted also.  Advise obtained from [name] via faxed request.  Thanks ,  Sharyn Lull, [place]

## 2020-09-01 DIAGNOSIS — I2510 Atherosclerotic heart disease of native coronary artery without angina pectoris: Secondary | ICD-10-CM | POA: Diagnosis not present

## 2020-09-01 DIAGNOSIS — I208 Other forms of angina pectoris: Secondary | ICD-10-CM | POA: Diagnosis not present

## 2020-09-01 DIAGNOSIS — Z23 Encounter for immunization: Secondary | ICD-10-CM | POA: Diagnosis not present

## 2020-09-01 DIAGNOSIS — I2121 ST elevation (STEMI) myocardial infarction involving left circumflex coronary artery: Secondary | ICD-10-CM | POA: Diagnosis not present

## 2020-09-15 ENCOUNTER — Other Ambulatory Visit: Payer: Self-pay | Admitting: Family Medicine

## 2020-09-17 LAB — HM DIABETES EYE EXAM

## 2020-10-03 ENCOUNTER — Other Ambulatory Visit: Payer: Self-pay

## 2020-10-03 ENCOUNTER — Ambulatory Visit (INDEPENDENT_AMBULATORY_CARE_PROVIDER_SITE_OTHER): Payer: BC Managed Care – PPO | Admitting: Family Medicine

## 2020-10-03 VITALS — BP 122/80 | HR 64 | Temp 97.6°F | Ht 68.0 in | Wt 141.0 lb

## 2020-10-03 DIAGNOSIS — E119 Type 2 diabetes mellitus without complications: Secondary | ICD-10-CM

## 2020-10-03 DIAGNOSIS — E785 Hyperlipidemia, unspecified: Secondary | ICD-10-CM

## 2020-10-03 DIAGNOSIS — I7300 Raynaud's syndrome without gangrene: Secondary | ICD-10-CM

## 2020-10-03 DIAGNOSIS — Z0000 Encounter for general adult medical examination without abnormal findings: Secondary | ICD-10-CM | POA: Diagnosis not present

## 2020-10-03 DIAGNOSIS — I10 Essential (primary) hypertension: Secondary | ICD-10-CM | POA: Diagnosis not present

## 2020-10-03 DIAGNOSIS — Z1159 Encounter for screening for other viral diseases: Secondary | ICD-10-CM

## 2020-10-03 DIAGNOSIS — E1169 Type 2 diabetes mellitus with other specified complication: Secondary | ICD-10-CM | POA: Diagnosis not present

## 2020-10-03 LAB — LIPID PANEL
Cholesterol: 137 mg/dL (ref 0–200)
HDL: 36.6 mg/dL — ABNORMAL LOW (ref >39.00)
LDL Cholesterol: 71 mg/dL (ref 0–99)
NonHDL: 100.26
Total CHOL/HDL Ratio: 4
Triglycerides: 148 mg/dL (ref 0.0–149.0)
VLDL: 29.6 mg/dL (ref 0.0–40.0)

## 2020-10-03 LAB — COMPREHENSIVE METABOLIC PANEL
ALT: 16 U/L (ref 0–35)
AST: 25 U/L (ref 0–37)
Albumin: 5.1 g/dL (ref 3.5–5.2)
Alkaline Phosphatase: 64 U/L (ref 39–117)
BUN: 15 mg/dL (ref 6–23)
CO2: 30 mEq/L (ref 19–32)
Calcium: 9.5 mg/dL (ref 8.4–10.5)
Chloride: 102 mEq/L (ref 96–112)
Creatinine, Ser: 0.97 mg/dL (ref 0.40–1.20)
GFR: 63.6 mL/min (ref >60.00)
Glucose, Bld: 127 mg/dL — ABNORMAL HIGH (ref 70–99)
Potassium: 4.5 mEq/L (ref 3.5–5.1)
Sodium: 140 mEq/L (ref 135–145)
Total Bilirubin: 2.1 mg/dL — ABNORMAL HIGH (ref 0.2–1.2)
Total Protein: 7 g/dL (ref 6.0–8.3)

## 2020-10-03 LAB — CBC
HCT: 41 % (ref 36.0–46.0)
Hemoglobin: 13.6 g/dL (ref 12.0–15.0)
MCHC: 33.1 g/dL (ref 30.0–36.0)
MCV: 91.5 fl (ref 78.0–100.0)
Platelets: 222 10*3/uL (ref 150.0–400.0)
RBC: 4.48 Mil/uL (ref 3.87–5.11)
RDW: 13.3 % (ref 11.5–15.5)
WBC: 4 10*3/uL (ref 4.0–10.5)

## 2020-10-03 LAB — HEMOGLOBIN A1C: Hgb A1c MFr Bld: 6.4 % (ref 4.6–6.5)

## 2020-10-03 MED ORDER — ACCU-CHEK AVIVA PLUS VI STRP: ORAL_STRIP | 4 refills | Status: AC

## 2020-10-03 NOTE — Assessment & Plan Note (Signed)
Pt notes intermittent cold fingers and white finger tips. No other symptoms and responds to warming. Advised continued monitoring and f/u if worsening or new symptoms

## 2020-10-03 NOTE — Patient Instructions (Addendum)
Can try Melatonin 3-5 mg  Sleep hygiene checklist: 1. Avoid naps during the day 2. Avoid stimulants such as caffeine and nicotine. Avoid bedtime alcohol (it can speed onset of sleep but the body's metabolism can cause awakenings). At least 2 hours before bedtime 3. All forms of exercise help ensure sound sleep - limit vigorous exercise to morning or late afternoon 4. Avoid food too close to bedtime including chocolate (which contains caffeine) 5. Soak up natural light 6. Establish regular bedtime routine. 7. Associate bed with sleep - avoid TV, computer or phone, reading while in bed. 8. Ensure pleasant, relaxing sleep environment - quiet, dark, cool room.  Good Sleep Hygiene Habits -- Got to bed and wake up within an hour of the same time every day -- Avoid bright screens (from laptop, phone, TV) within at least 30 minutes before bed. The "blue light" supresses the sleep hormone melatonin and the content may stimulate as well -- Maintain a quiet and dark sleep environment (blackout curtains, turn on a fan or white noise to block out disruptive sounds) -- Practicing relaxing activites before bed (taking a shower, reading a book, journaling, meditation app) -- To quiet a busy mind -- consider journaling before bed (jotting down reminders, worry thoughts, as well as positive things like a gratitude list)   Begin a Mindfulness/Meditation practice -- this can take a little as 3 minutes -- You can find resources in books -- Or you can download apps like  ---- Headspace App (which currently has free content called "Weathering the Storm") ---- Calm (which has a few free options)  ---- Insignt Timer ---- Stop, Breathe & Think  # With each of these Apps - you should decline the "start free trial" offer and as you search through the App should be able to access some of their free content. You can also chose to pay for the content if you find one that works well for you.   # Many of them also  offer sleep specific content which may help with insomnia

## 2020-10-03 NOTE — Progress Notes (Signed)
Annual Exam   Chief Complaint:  Chief Complaint  Patient presents with  . Annual Exam    no concerns/ colonoscopy scheduled for next month     History of Present Illness:  Ms. Melissa Lamb is a 59 y.o. No obstetric history on file. who LMP was No LMP recorded. Patient has had a hysterectomy., presents today for her annual examination.     Nutrition She does get adequate calcium and Vitamin D in her diet. Diet: tries to eat variety of foods - fish, veggies Exercise: jogged today, 5 days a week - 1-2 miles walking/jogging  Safety The patient wears seatbelts: yes.     The patient feels safe at home and in their relationships: yes.   Menstrual:  Occasional hot flashes Otherwise well   GYN She is not sexually active.    Cervical Cancer Screening (21-65):   Post hysterectomy - no cervix  Breast Cancer Screening (Age 53-74):  There is no FH of breast cancer. There is no FH of ovarian cancer. BRCA screening Not Indicated.  Last Mammogram: 10/2019 The patient does want a mammogram this year.    Colon Cancer Screening:  Age 73-75 yo - benefits outweigh the risk. Adults 74-85 yo who have never been screened benefit.  Benefits: 134000 people in 2016 will be diagnosed and 49,000 will die - early detection helps [place]: Complications 2/2 to colonoscopy High Risk (Colonoscopy): genetic disorder (Lynch syndrome or familial adenomatous polyposis), personal hx of IBD, previous adenomatous polyp, or previous colorectal cancer, FamHx start 10 years before the age at diagnosis, increased in males and black race  Options:  FIT - looks for hemoglobin (blood in the stool) - specific and fairly sensitive - must be done annually Cologuard - looks for DNA and blood - more sensitive - therefore can have more false positives, every 3 years Colonoscopy - every 10 years if normal - sedation, bowl prep, must have someone drive you  Shared decision making and the patient had decided to do  colonoscopy - scheduled next month.   Social History   Tobacco Use  Smoking Status Never Smoker  Smokeless Tobacco Never Used    Lung Cancer Screening (Ages 20-60): not applicable   Weight Wt Readings from Last 3 Encounters:  10/03/20 141 lb (64 kg)  06/07/20 142 lb 8 oz (64.6 kg)  04/18/20 143 lb 14.4 oz (65.3 kg)   Patient has normal BMI  BMI Readings from Last 1 Encounters:  10/03/20 21.44 kg/m     Chronic disease screening Blood pressure monitoring:  BP Readings from Last 3 Encounters:  10/03/20 122/80  06/07/20 (!) 150/80  04/18/20 122/67    Lipid Monitoring: Indication for screening: age >8, obesity, diabetes, family hx, CV risk factors.  Lipid screening: Yes  Lab Results  Component Value Date   CHOL 182 04/02/2020   HDL 29 (L) 04/02/2020   LDLCALC 96 04/02/2020   TRIG 284 (H) 04/02/2020   CHOLHDL 6.3 04/02/2020     Diabetes Screening: age >42, overweight, family hx, PCOS, hx of gestational diabetes, at risk ethnicity Diabetes Screening screening: Yes  Lab Results  Component Value Date   HGBA1C 6.0 (H) 04/01/2020     Past Medical History:  Diagnosis Date  . Diabetes mellitus without complication ([place])   . History of chickenpox   . Hyperlipidemia   . Hypertension   . Thyroid cancer [facility])     Past Surgical History:  Procedure Laterality Date  . ABDOMINAL HYSTERECTOMY  2014  has both ovaries still.  . APPENDECTOMY  2019  . BREAST BIOPSY Right    benign-seed was put in to mark it  . BREAST REDUCTION SURGERY    . CHOLECYSTECTOMY  2019  . CORONARY/GRAFT ACUTE MI REVASCULARIZATION N/A 04/01/2020   Procedure: Coronary/Graft Acute MI Revascularization;  Surgeon: Yolonda Kida, MD;  Location: [place] [facility];  Service: Cardiovascular;  Laterality: N/A;  . LEFT HEART CATH AND CORONARY ANGIOGRAPHY N/A 04/01/2020   Procedure: LEFT HEART CATH AND CORONARY ANGIOGRAPHY;  Surgeon: Yolonda Kida, MD;  Location: [place] [facility];   Service: Cardiovascular;  Laterality: N/A;  . REDUCTION MAMMAPLASTY    . THYROIDECTOMY  2014   papillary well-differentiated lymph nodes.  . TONSILLECTOMY AND ADENOIDECTOMY      Prior to Admission medications   Medication Sig Start Date End Date Taking? Authorizing Provider  acetic acid-hydrocortisone (VOSOL-HC) OTIC solution 2 (two) times daily.   Yes [provider]  aspirin 81 MG chewable tablet Chew 1 tablet (81 mg total) by mouth daily. Need to take for at least a year. 04/04/20 04/04/21 Yes Enzo Bi, MD  Blood Glucose Monitoring Suppl (ACCU-CHEK AVIVA PLUS) w/Device KIT USE TO CHECK BLOOD SUGAR ONCE DAILY 09/03/19  Yes [provider]  Cholecalciferol (VITAMIN D-1000 MAX ST) 25 MCG (1000 UT) tablet Take 2,000 Units by mouth daily.    Yes [provider]  fenofibrate 160 MG tablet Take 1 tablet (160 mg total) by mouth daily. 10/20/19  Yes [name], MD  fluocinolone (SYNALAR) 0.01 % external solution Apply topically 2 (two) times daily.   Yes [provider]  glipiZIDE (GLUCOTROL) 5 MG tablet Take 1.5 tablets (7.5 mg total) by mouth daily. 04/06/20  Yes [name], MD  glucose blood (ACCU-CHEK AVIVA PLUS) test strip USE TO CHECK BLOOD SUGAR ONCE DAILY 10/03/20  Yes [name], MD  levothyroxine (SYNTHROID) 88 MCG tablet Take 88 mcg by mouth daily.    Yes [provider]  losartan (COZAAR) 25 MG tablet Take 50 mg by mouth daily.  04/08/20  Yes [provider]  metFORMIN (GLUCOPHAGE) 500 MG tablet Take 1 tablet (500 mg total) by mouth 2 (two) times daily. 04/06/20  Yes [name], MD  metoprolol succinate (TOPROL-XL) 50 MG 24 hr tablet Take 1 tablet (50 mg total) by mouth daily. Take with or immediately following a meal. 04/18/20  Yes Swayze, Ava, DO  neomycin-polymyxin-hydrocortisone (CORTISPORIN) 3.5-10000-1 OTIC suspension Place 3 drops into the right ear 4 (four) times daily. 06/08/20  Yes [name], MD  ticagrelor  (BRILINTA) 90 MG TABS tablet Take 1 tablet (90 mg total) by mouth 2 (two) times daily. Need to take this for at least a year. 04/03/20 04/03/21 Yes Enzo Bi, MD  rosuvastatin (CRESTOR) 40 MG tablet Take 1 tablet (40 mg total) by mouth daily. 04/04/20 07/03/20  Enzo Bi, MD    Allergies  Allergen Reactions  . Lisinopril Tinitus and Cough    Gynecologic History: No LMP recorded. Patient has had a hysterectomy.  Obstetric History: No obstetric history on file.  Social History   Socioeconomic History  . Marital status: Widowed    Spouse name: Vicente Males  . Number of children: 2  . Years of education: Some college  . Highest education level: Not on file  Occupational History  . Not on file  Tobacco Use  . Smoking status: Never Smoker  . Smokeless tobacco: Never Used  Vaping Use  .  Vaping Use: Never used  Substance and Sexual Activity  . Alcohol use: Yes    Comment: rare  . Drug use: Never  . Sexual activity: Not Currently  Other Topics Concern  . Not on file  Social History Narrative   10/20/19   From: [place]   Living: with son - [name]   Work: not currently, caring for her grandchild   Widowed: husband, Vicente Males, passed away in 18-Jan-2019 - Dr who trained at [facility] [employer]      Family: Daughter - Colletta [place] (nearby), has 1 grandchild - Minette Brine 01-18-2018)      Enjoys: crafts, doing projects, gardening      Exercise: not as often, tries to walk   Diet: well rounded, does eat some unhealthy food, tries to follow diabetic diet      Safety   Seat belts: Yes    Guns: No   Safe in relationships: Yes    Social Determinants of Radio broadcast assistant Strain: Low Risk   . Difficulty of Paying Living Expenses: Not hard at all  Food Insecurity:   . Worried About Charity fundraiser in the Last Year: Not on file  . Ran Out of Food in the Last Year: Not on file  Transportation Needs:   . Lack of Transportation (Medical): Not on file  . Lack of Transportation (Non-Medical): Not on file   Physical Activity:   . Days of Exercise per Week: Not on file  . Minutes of Exercise per Session: Not on file  Stress:   . Feeling of Stress : Not on file  Social Connections:   . Frequency of Communication with Friends and Family: Not on file  . Frequency of Social Gatherings with Friends and Family: Not on file  . Attends Religious Services: Not on file  . Active Member of Clubs or Organizations: Not on file  . Attends Archivist Meetings: Not on file  . Marital Status: Not on file  Intimate Partner Violence:   . Fear of Current or Ex-Partner: Not on file  . Emotionally Abused: Not on file  . Physically Abused: Not on file  . Sexually Abused: Not on file    Family History  Problem Relation Age of Onset  . Diabetes Mother   . Hyperlipidemia Mother   . Hypertension Mother   . Hyperlipidemia Father   . Heart disease Father   . Heart attack Father 66       had defibrillator put in  . Diabetes Sister   . Hypertension Sister   . Diabetes Brother   . Hypertension Brother   . Cervical cancer Maternal Aunt   . Uterine cancer Maternal Grandmother   . Breast cancer Maternal Grandmother   . Diabetes Sister   . Hypertension Sister   . Breast cancer Maternal Aunt 01-18-57    Review of Systems  Constitutional: Negative for chills and fever.  HENT: Negative for congestion and sore throat.   Eyes: Negative for blurred vision and double vision.  Respiratory: Negative for shortness of breath.   Cardiovascular: Negative for chest pain.  Gastrointestinal: Negative for heartburn, nausea and vomiting.  Genitourinary: Negative.   Musculoskeletal: Negative for myalgias.       White fingers in cold  Skin: Negative for rash.  Neurological: Negative for dizziness and headaches.  Endo/Heme/Allergies: Does not bruise/bleed easily.  Psychiatric/Behavioral: Negative for depression. The patient is not nervous/anxious.      Physical Exam BP 122/80   Pulse 64  Temp 97.6 F (36.4 C)  (Temporal)   Ht _0  (1.727 m)   Wt 141 lb (64 kg)   SpO2 100%   BMI 21.44 kg/m    BP Readings from Last 3 Encounters:  10/03/20 122/80  06/07/20 (!) 150/80  04/18/20 122/67      Physical Exam Constitutional:      General: She is not in acute distress.    Appearance: She is well-developed. She is not diaphoretic.  HENT:     Head: Normocephalic and atraumatic.     Right Ear: External ear normal.     Left Ear: External ear normal.     Nose: Nose normal.  Eyes:     General: No scleral icterus.    Conjunctiva/sclera: Conjunctivae normal.  Cardiovascular:     Rate and Rhythm: Normal rate and regular rhythm.     Heart sounds: No murmur heard.   Pulmonary:     Effort: Pulmonary effort is normal. No respiratory distress.     Breath sounds: Normal breath sounds. No wheezing.  Abdominal:     General: Bowel sounds are normal. There is no distension.     Palpations: Abdomen is soft. There is no mass.     Tenderness: There is no abdominal tenderness. There is no guarding or rebound.  Musculoskeletal:        General: Normal range of motion.     Cervical back: Neck supple.  Lymphadenopathy:     Cervical: No cervical adenopathy.  Skin:    General: Skin is warm and dry.     Capillary Refill: Capillary refill takes less than 2 seconds.  Neurological:     Mental Status: She is alert and oriented to person, place, and time.     Deep Tendon Reflexes: Reflexes normal.  Psychiatric:        Behavior: Behavior normal.     Results:  PHQ-9:    Cardiac Rehab from 04/11/2020 in [facility] Cardiac [facility] Pul[facility]  PHQ-9 Total Score 2        Assessment: 59 y.o. No obstetric history on file. female here for routine annual physical examination.  Plan: Problem List Items Addressed This Visit      Cardiovascular and Mediastinum   Essential hypertension   Relevant Orders   Comprehensive metabolic panel   CBC     Endocrine   Diabetes mellitus without complication ([place])    Relevant Orders   Hemoglobin A1c   Type 2 diabetes mellitus with hyperlipidemia ([place]) - Primary   Relevant Orders   Lipid panel   Hemoglobin A1c     Other   Raynaud's phenomenon without gangrene    Pt notes intermittent cold fingers and white finger tips. No other symptoms and responds to warming. Advised continued monitoring and f/u if worsening or new symptoms       Other Visit Diagnoses    Annual physical exam       Need for hepatitis C screening test       Relevant Orders   Hepatitis C antibody      Screening: -- Blood pressure screen normal -- cholesterol screening: will obtain -- Weight screening: normal -- Diabetes Screening: has diabetes - will check -- Nutrition: Encouraged healthy diet  The ASCVD Risk score Mikey Bussing DC Jr., et al., 2013) failed to calculate for the following reasons:   The patient has a prior MI or stroke diagnosis  -- Statin therapy for Age 23-75 with CVD risk >7.5%  Psych -- Depression screening (PHQ-9):  Cardiac Rehab from 04/11/2020 in [facility]  PHQ-9 Total Score 2       Safety -- tobacco screening: not using -- alcohol screening:  low-risk usage. -- no evidence of domestic violence or intimate partner violence.   Cancer Screening -- pap smear not collected per ASCCP guidelines - s/p hysterectomy -- family history of breast cancer screening: done. not at high risk. -- Mammogram - she will call -- Colon cancer (age 20+)-- scheduled  Immunizations Immunization History  Administered Date(s) Administered  . Influenza, Quadrivalent, Recombinant, Inj, Pf 10/04/2019  . Influenza,inj,Quad PF,6+ Mos 09/12/2020  . PFIZER SARS-COV-2 Vaccination 01/27/2020, 02/17/2020    -- flu vaccine up to date -- TDAP q10 years up to date -- Shingles (age >71) - she will get eventually -- PPSV-23 (19-64 with chronic disease or smoking) defer today  -- Covid-19 Vaccine - up to date   Encouraged healthy diet and exercise.  Encouraged regular vision and dental care.    [name], MD

## 2020-10-04 LAB — HEPATITIS C ANTIBODY
Hepatitis C Ab: NONREACTIVE
SIGNAL TO CUT-OFF: 0.01 (ref <1.00)

## 2020-10-07 ENCOUNTER — Other Ambulatory Visit: Payer: Self-pay | Admitting: Family Medicine

## 2020-10-07 DIAGNOSIS — Z1231 Encounter for screening mammogram for malignant neoplasm of breast: Secondary | ICD-10-CM

## 2020-10-10 ENCOUNTER — Encounter: Payer: Self-pay | Admitting: Family Medicine

## 2020-10-20 ENCOUNTER — Other Ambulatory Visit: Payer: Self-pay

## 2020-10-20 ENCOUNTER — Other Ambulatory Visit
Admission: RE | Admit: 2020-10-20 | Discharge: 2020-10-20 | Disposition: A | Discharge status: Home / Self Care | Payer: BC Managed Care – PPO | Source: Ambulatory Visit | Attending: Gastroenterology | Admitting: Gastroenterology

## 2020-10-20 DIAGNOSIS — E890 Postprocedural hypothyroidism: Secondary | ICD-10-CM | POA: Diagnosis not present

## 2020-10-20 DIAGNOSIS — Z01812 Encounter for preprocedural laboratory examination: Secondary | ICD-10-CM | POA: Diagnosis not present

## 2020-10-20 DIAGNOSIS — C73 Malignant neoplasm of thyroid gland: Secondary | ICD-10-CM | POA: Diagnosis not present

## 2020-10-20 DIAGNOSIS — Z20822 Contact with and (suspected) exposure to covid-19: Secondary | ICD-10-CM | POA: Insufficient documentation

## 2020-10-20 LAB — SARS CORONAVIRUS 2 (TAT 6-24 HRS): SARS Coronavirus 2: NEGATIVE

## 2020-10-21 ENCOUNTER — Other Ambulatory Visit: Payer: BC Managed Care – PPO

## 2020-10-24 ENCOUNTER — Encounter
Admission: RE | Disposition: A | Discharge status: Home / Self Care | Payer: Self-pay | Source: Home / Self Care | Attending: Gastroenterology

## 2020-10-24 ENCOUNTER — Encounter: Payer: Self-pay | Admitting: Gastroenterology

## 2020-10-24 ENCOUNTER — Ambulatory Visit: Payer: BC Managed Care – PPO | Admitting: Anesthesiology

## 2020-10-24 ENCOUNTER — Ambulatory Visit
Admission: RE | Admit: 2020-10-24 | Discharge: 2020-10-24 | Disposition: A | Discharge status: Home / Self Care | Payer: BC Managed Care – PPO | Attending: Gastroenterology | Admitting: Gastroenterology

## 2020-10-24 ENCOUNTER — Other Ambulatory Visit: Payer: Self-pay

## 2020-10-24 DIAGNOSIS — Z79890 Hormone replacement therapy (postmenopausal): Secondary | ICD-10-CM | POA: Insufficient documentation

## 2020-10-24 DIAGNOSIS — K5730 Diverticulosis of large intestine without perforation or abscess without bleeding: Secondary | ICD-10-CM | POA: Insufficient documentation

## 2020-10-24 DIAGNOSIS — Z860100 Personal history of colon polyps, unspecified: Secondary | ICD-10-CM

## 2020-10-24 DIAGNOSIS — Z79899 Other long term (current) drug therapy: Secondary | ICD-10-CM | POA: Diagnosis not present

## 2020-10-24 DIAGNOSIS — K5790 Diverticulosis of intestine, part unspecified, without perforation or abscess without bleeding: Secondary | ICD-10-CM | POA: Diagnosis not present

## 2020-10-24 DIAGNOSIS — Z1211 Encounter for screening for malignant neoplasm of colon: Secondary | ICD-10-CM | POA: Diagnosis not present

## 2020-10-24 DIAGNOSIS — Z7984 Long term (current) use of oral hypoglycemic drugs: Secondary | ICD-10-CM | POA: Diagnosis not present

## 2020-10-24 DIAGNOSIS — K635 Polyp of colon: Secondary | ICD-10-CM

## 2020-10-24 DIAGNOSIS — Z7982 Long term (current) use of aspirin: Secondary | ICD-10-CM | POA: Insufficient documentation

## 2020-10-24 DIAGNOSIS — K644 Residual hemorrhoidal skin tags: Secondary | ICD-10-CM | POA: Insufficient documentation

## 2020-10-24 DIAGNOSIS — D125 Benign neoplasm of sigmoid colon: Secondary | ICD-10-CM | POA: Insufficient documentation

## 2020-10-24 DIAGNOSIS — Z85850 Personal history of malignant neoplasm of thyroid: Secondary | ICD-10-CM | POA: Diagnosis not present

## 2020-10-24 HISTORY — DX: Acute myocardial infarction, unspecified: I21.9

## 2020-10-24 HISTORY — PX: COLONOSCOPY WITH PROPOFOL: SHX5780

## 2020-10-24 SURGERY — COLONOSCOPY WITH PROPOFOL
Anesthesia: General

## 2020-10-24 MED ORDER — PROPOFOL 500 MG/50ML IV EMUL
INTRAVENOUS | Status: DC | PRN
  Administered 2020-10-24: 180 ug/kg/min via INTRAVENOUS

## 2020-10-24 MED ORDER — PROPOFOL 10 MG/ML IV BOLUS
INTRAVENOUS | Status: DC | PRN
  Administered 2020-10-24: 40 mg via INTRAVENOUS

## 2020-10-24 MED ORDER — LIDOCAINE HCL (CARDIAC) PF 100 MG/5ML IV SOSY
PREFILLED_SYRINGE | INTRAVENOUS | Status: DC | PRN
  Administered 2020-10-24: 80 mg via INTRATRACHEAL

## 2020-10-24 MED ORDER — SODIUM CHLORIDE 0.9 % IV SOLN: INTRAVENOUS | Status: DC

## 2020-10-24 MED ORDER — ONDANSETRON HCL 4 MG/2ML IJ SOLN
INTRAMUSCULAR | Status: DC | PRN
  Administered 2020-10-24: 4 mg via INTRAVENOUS

## 2020-10-24 MED ORDER — PROPOFOL 500 MG/50ML IV EMUL
INTRAVENOUS | Status: AC
  Filled 2020-10-24: qty 50

## 2020-10-24 NOTE — Transfer of Care (Signed)
Immediate Anesthesia Transfer of Care Note  Patient: Rema Fendt  Procedure(s) Performed: COLONOSCOPY WITH PROPOFOL (N/A )  Patient Location: PACU  Anesthesia Type:General  Level of Consciousness: awake, alert  and oriented  Airway & Oxygen Therapy: Patient Spontanous Breathing and Patient connected to nasal cannula oxygen  Post-op Assessment: Report given to RN and Post -op Vital signs reviewed and stable  Post vital signs: Reviewed and stable  Last Vitals:  Vitals Value Taken Time  BP    Temp    Pulse 60 10/24/20 0903  Resp 15 10/24/20 0903  SpO2 98 % 10/24/20 0903  Vitals shown include unvalidated device data.  Last Pain:  Vitals:   10/24/20 0858  TempSrc: Tympanic  PainSc: 0-No pain         Complications: No complications documented.

## 2020-10-24 NOTE — H&P (Signed)
Melissa Darby, MD [address]  [place]  [place], [place] [address]  Main: [phone]  Fax: [phone] Pager: [phone]  Primary Care Physician:  [name], MD Primary Gastroenterologist:  Dr. Cephas Lamb  Pre-Procedure History & Physical: HPI:  Melissa Lamb is a 59 y.o. female is here for an colonoscopy.   Past Medical History:  Diagnosis Date  . Diabetes mellitus without complication ([place])   . History of chickenpox   . Hyperlipidemia   . Hypertension   . Myocardial infarction ([place])   . Thyroid cancer [facility])     Past Surgical History:  Procedure Laterality Date  . ABDOMINAL HYSTERECTOMY  2014   has both ovaries still.  . APPENDECTOMY  2019  . BREAST BIOPSY Right    benign-seed was put in to mark it  . BREAST REDUCTION SURGERY    . CHOLECYSTECTOMY  2019  . CORONARY/GRAFT ACUTE MI REVASCULARIZATION N/A 04/01/2020   Procedure: Coronary/Graft Acute MI Revascularization;  Surgeon: Yolonda Kida, MD;  Location: [place] [facility];  Service: Cardiovascular;  Laterality: N/A;  . LEFT HEART CATH AND CORONARY ANGIOGRAPHY N/A 04/01/2020   Procedure: LEFT HEART CATH AND CORONARY ANGIOGRAPHY;  Surgeon: Yolonda Kida, MD;  Location: [place] [facility];  Service: Cardiovascular;  Laterality: N/A;  . REDUCTION MAMMAPLASTY    . THYROIDECTOMY  2014   papillary well-differentiated lymph nodes.  . TONSILLECTOMY AND ADENOIDECTOMY      Prior to Admission medications   Medication Sig Start Date End Date Taking? Authorizing Provider  aspirin 81 MG chewable tablet Chew 1 tablet (81 mg total) by mouth daily. Need to take for at least a year. 04/04/20 04/04/21 Yes Enzo Bi, MD  Cholecalciferol (VITAMIN D-1000 MAX ST) 25 MCG (1000 UT) tablet Take 2,000 Units by mouth daily.    Yes [provider]  fenofibrate 160 MG tablet Take 1 tablet (160 mg total) by mouth daily. 10/20/19  Yes [name], MD  glipiZIDE (GLUCOTROL) 5 MG tablet Take 1.5  tablets (7.5 mg total) by mouth daily. 04/06/20  Yes [name], MD  levothyroxine (SYNTHROID) 88 MCG tablet Take 88 mcg by mouth daily.    Yes [provider]  losartan (COZAAR) 25 MG tablet Take 50 mg by mouth daily.  04/08/20  Yes [provider]  metFORMIN (GLUCOPHAGE) 500 MG tablet Take 1 tablet (500 mg total) by mouth 2 (two) times daily. 04/06/20  Yes [name], MD  metoprolol succinate (TOPROL-XL) 50 MG 24 hr tablet Take 1 tablet (50 mg total) by mouth daily. Take with or immediately following a meal. 04/18/20  Yes Swayze, Ava, DO  ticagrelor (BRILINTA) 90 MG TABS tablet Take 1 tablet (90 mg total) by mouth 2 (two) times daily. Need to take this for at least a year. 04/03/20 04/03/21 Yes Enzo Bi, MD  acetic acid-hydrocortisone (VOSOL-HC) OTIC solution 2 (two) times daily.    [provider]  Blood Glucose Monitoring Suppl (ACCU-CHEK AVIVA PLUS) w/Device KIT USE TO CHECK BLOOD SUGAR ONCE DAILY 09/03/19   [provider]  fluocinolone (SYNALAR) 0.01 % external solution Apply topically 2 (two) times daily.    [provider]  glucose blood (ACCU-CHEK AVIVA PLUS) test strip USE TO CHECK BLOOD SUGAR ONCE DAILY 10/03/20   [name], MD  neomycin-polymyxin-hydrocortisone (CORTISPORIN) 3.5-10000-1 OTIC suspension Place 3 drops into the right ear 4 (four) times daily. 06/08/20   [name], MD  rosuvastatin (CRESTOR) 40 MG  tablet Take 1 tablet (40 mg total) by mouth daily. 04/04/20 07/03/20  Enzo Bi, MD    Allergies as of 06/16/2020 - Review Complete 06/16/2020  Allergen Reaction Noted  . Lisinopril Tinitus and Cough 04/01/2020    Family History  Problem Relation Age of Onset  . Diabetes Mother   . Hyperlipidemia Mother   . Hypertension Mother   . Hyperlipidemia Father   . Heart disease Father   . Heart attack Father 23       had defibrillator put in  . Diabetes Sister   . Hypertension Sister   . Diabetes Brother   .  Hypertension Brother   . Cervical cancer Maternal Aunt   . Uterine cancer Maternal Grandmother   . Breast cancer Maternal Grandmother   . Diabetes Sister   . Hypertension Sister   . Breast cancer Maternal Aunt 58    Social History   Socioeconomic History  . Marital status: Widowed    Spouse name: Vicente Males  . Number of children: 2  . Years of education: Some college  . Highest education level: Not on file  Occupational History  . Not on file  Tobacco Use  . Smoking status: Never Smoker  . Smokeless tobacco: Never Used  Vaping Use  . Vaping Use: Never used  Substance and Sexual Activity  . Alcohol use: Yes    Comment: rare  . Drug use: Never  . Sexual activity: Not Currently  Other Topics Concern  . Not on file  Social History Narrative   10/20/19   From: [place]   Living: with son - [name]   Work: not currently, caring for her grandchild   Widowed: husband, Vicente Males, passed away in Jan 22, 2019 - Dr who trained at [facility]      Family: Daughter - Colletta [place] (nearby), has 1 grandchild - Minette Brine 2018/01/22)      Enjoys: crafts, doing projects, gardening      Exercise: not as often, tries to walk   Diet: well rounded, does eat some unhealthy food, tries to follow diabetic diet      Safety   Seat belts: Yes    Guns: No   Safe in relationships: Yes    Social Determinants of Radio broadcast assistant Strain:   . Difficulty of Paying Living Expenses: Not on file  Food Insecurity:   . Worried About Charity fundraiser in the Last Year: Not on file  . Ran Out of Food in the Last Year: Not on file  Transportation Needs:   . Lack of Transportation (Medical): Not on file  . Lack of Transportation (Non-Medical): Not on file  Physical Activity:   . Days of Exercise per Week: Not on file  . Minutes of Exercise per Session: Not on file  Stress:   . Feeling of Stress : Not on file  Social Connections:   . Frequency of Communication with Friends and Family: Not on file  . Frequency  of Social Gatherings with Friends and Family: Not on file  . Attends Religious Services: Not on file  . Active Member of Clubs or Organizations: Not on file  . Attends Archivist Meetings: Not on file  . Marital Status: Not on file  Intimate Partner Violence:   . Fear of Current or Ex-Partner: Not on file  . Emotionally Abused: Not on file  . Physically Abused: Not on file  . Sexually Abused: Not on file    Review of Systems: See HPI, otherwise negative  ROS  Physical Exam: BP (!) 141/80   Pulse 84   Temp 98.5 F (36.9 C) (Temporal)   Resp 17   Ht _0  (1.727 m)   Wt 64 kg   SpO2 100%   BMI 21.44 kg/m  General:   Alert,  pleasant and cooperative in NAD Head:  Normocephalic and atraumatic. Neck:  Supple; no masses or thyromegaly. Lungs:  Clear throughout to auscultation.    Heart:  Regular rate and rhythm. Abdomen:  Soft, nontender and nondistended. Normal bowel sounds, without guarding, and without rebound.   Neurologic:  Alert and  oriented x4;  grossly normal neurologically.  Impression/Plan: Imo [place] is here for an colonoscopy to be performed for personal h/o colon polyps  Risks, benefits, limitations, and alternatives regarding  colonoscopy have been reviewed with the patient.  Questions have been answered.  All parties agreeable.   Sherri Sear, MD  10/24/2020, 8:24 AM

## 2020-10-24 NOTE — Anesthesia Preprocedure Evaluation (Signed)
Anesthesia Evaluation  Patient identified by MRN, date of birth, ID band Patient awake    Reviewed: Allergy & Precautions, NPO status , Patient's Chart, lab work & pertinent test results  Airway Mallampati: II  TM Distance: <3 FB     Dental   Pulmonary neg pulmonary ROS,    Pulmonary exam normal        Cardiovascular hypertension, + Past MI  Normal cardiovascular exam     Neuro/Psych negative neurological ROS  negative psych ROS   GI/Hepatic negative GI ROS, Neg liver ROS,   Endo/Other  diabetesHypothyroidism   Renal/GU negative Renal ROS  negative genitourinary   Musculoskeletal   Abdominal Normal abdominal exam  (+)   Peds negative pediatric ROS (+)  Hematology negative hematology ROS (+)   Anesthesia Other Findings Past Medical History: No date: Diabetes mellitus without complication ([facility]) No date: History of chickenpox No date: Hyperlipidemia No date: Hypertension No date: Myocardial infarction ([place]) No date: Thyroid cancer ([place])  Reproductive/Obstetrics                             Anesthesia Physical Anesthesia Plan  ASA: III  Anesthesia Plan: General   Post-op Pain Management:    Induction: Intravenous  PONV Risk Score and Plan: Propofol infusion  Airway Management Planned: Nasal Cannula  Additional Equipment:   Intra-op Plan:   Post-operative Plan:   Informed Consent: I have reviewed the patients History and Physical, chart, labs and discussed the procedure including the risks, benefits and alternatives for the proposed anesthesia with the patient or authorized representative who has indicated his/her understanding and acceptance.     Dental advisory given  Plan Discussed with: CRNA and Surgeon  Anesthesia Plan Comments:         Anesthesia Quick Evaluation

## 2020-10-24 NOTE — Op Note (Signed)
[facility] Gastroenterology Patient Name: Perel Hauschild Procedure Date: 10/24/2020 7:20 AM MRN: [id number] Account #: [id number] Date of Birth: 10/20/1961 Admit Type: Outpatient Age: 59 Room: [facility] ENDO ROOM 1 Gender: Female Note Status: Finalized Procedure:             Colonoscopy Indications:           Surveillance: Personal history of colonic polyps                         (unknown histology) on last colonoscopy more than 5                         years ago Providers:             Lin Landsman MD, MD Medicines:             General Anesthesia Complications:         No immediate complications. Estimated blood loss: None. Procedure:             Pre-Anesthesia Assessment:                        - Prior to the procedure, a History and Physical was                         performed, and patient medications and allergies were                         reviewed. The patient is competent. The risks and                         benefits of the procedure and the sedation options and                         risks were discussed with the patient. All questions                         were answered and informed consent was obtained.                         Patient identification and proposed procedure were                         verified by the physician, the nurse, the                         anesthesiologist, the anesthetist and the technician                         in the pre-procedure area in the procedure room in the                         endoscopy suite. Mental Status Examination: alert and                         oriented. Airway Examination: normal oropharyngeal                         airway and neck mobility. Respiratory Examination:  clear to auscultation. CV Examination: normal.                         Prophylactic Antibiotics: The patient does not require                         prophylactic antibiotics. Prior Anticoagulants: The                          patient has taken antiplatelet medication, last dose                         was 3 days prior to procedure. ASA Grade Assessment:                         III - A patient with severe systemic disease. After                         reviewing the risks and benefits, the patient was                         deemed in satisfactory condition to undergo the                         procedure. The anesthesia plan was to use general                         anesthesia. Immediately prior to administration of                         medications, the patient was re-assessed for adequacy                         to receive sedatives. The heart rate, respiratory                         rate, oxygen saturations, blood pressure, adequacy of                         pulmonary ventilation, and response to care were                         monitored throughout the procedure. The physical                         status of the patient was re-assessed after the                         procedure.                        After obtaining informed consent, the colonoscope was                         passed under direct vision. Throughout the procedure,                         the patient's blood pressure, pulse, and oxygen  saturations were monitored continuously. The                         Colonoscope was introduced through the anus and                         advanced to the the cecum, identified by appendiceal                         orifice and ileocecal valve. The colonoscopy was                         performed without difficulty. The patient tolerated                         the procedure well. The quality of the bowel                         preparation was evaluated using the [facility] Bowel                         Preparation Scale) with scores of: Right Colon = 3,                         Transverse Colon = 3 and Left Colon = 3 (entire mucosa                          seen well with no residual staining, small fragments                         of stool or opaque liquid). The total BBPS score                         equals 9. Findings:      Skin tags were found on perianal exam.      A 5 mm polyp was found in the sigmoid colon. The polyp was sessile. The       polyp was removed with a cold snare. Resection and retrieval were       complete.      Multiple diverticula were found in the sigmoid colon.      Non-bleeding external hemorrhoids were found during retroflexion. The       hemorrhoids were medium-sized. Impression:            - Perianal skin tags found on perianal exam.                        - One 5 mm polyp in the sigmoid colon, removed with a                         cold snare. Resected and retrieved.                        - Diverticulosis in the sigmoid colon.                        - Non-bleeding external hemorrhoids. Recommendation:        - Discharge patient to home (with escort).                        -  Resume previous diet today.                        - Continue present medications.                        - Await pathology results.                        - Resume antiplatelet medication at prior dose                         tomorrow. Refer to managing physician for further                         adjustment of therapy.                        - Repeat colonoscopy in 5 years for surveillance. Procedure Code(s):     --- Professional ---                        [phone], Colonoscopy, flexible; with removal of                         tumor(s), polyp(s), or other lesion(s) by snare                         technique Diagnosis Code(s):     --- Professional ---                        K64.4, Residual hemorrhoidal skin tags                        Z86.010, Personal history of colonic polyps                        K63.5, Polyp of colon                        K57.30, Diverticulosis of large intestine without                         perforation  or abscess without bleeding CPT copyright 2019 American Medical Association. All rights reserved. The codes documented in this report are preliminary and upon coder review may  be revised to meet current compliance requirements. Dr. Ulyess Mort Lin Landsman MD, MD 10/24/2020 8:49:50 AM This report has been signed electronically. Number of Addenda: 0 Note Initiated On: 10/24/2020 7:20 AM Scope Withdrawal Time: 0 hours 8 minutes 44 seconds  Total Procedure Duration: 0 hours 13 minutes 50 seconds  Estimated Blood Loss:  Estimated blood loss: none.      [facility]

## 2020-10-25 ENCOUNTER — Encounter: Payer: Self-pay | Admitting: Gastroenterology

## 2020-10-25 LAB — SURGICAL PATHOLOGY

## 2020-10-25 NOTE — Anesthesia Postprocedure Evaluation (Signed)
Anesthesia Post Note  Patient: Camri Molloy  Procedure(s) Performed: COLONOSCOPY WITH PROPOFOL (N/A )  Patient location during evaluation: Endoscopy Anesthesia Type: General Level of consciousness: awake and alert and oriented Pain management: pain level controlled Vital Signs Assessment: post-procedure vital signs reviewed and stable Respiratory status: spontaneous breathing Cardiovascular status: blood pressure returned to baseline Anesthetic complications: no   No complications documented.   Last Vitals:  Vitals:   10/24/20 0918 10/24/20 0928  BP: 126/74 119/68  Pulse: (!) 57 (!) 57  Resp: 13 13  Temp:    SpO2: 98% 100%    Last Pain:  Vitals:   10/25/20 0748  TempSrc:   PainSc: 0-No pain                 Anielle Headrick

## 2020-10-26 ENCOUNTER — Encounter: Payer: Self-pay | Admitting: Gastroenterology

## 2020-10-28 DIAGNOSIS — I208 Other forms of angina pectoris: Secondary | ICD-10-CM | POA: Diagnosis not present

## 2020-10-28 DIAGNOSIS — I2510 Atherosclerotic heart disease of native coronary artery without angina pectoris: Secondary | ICD-10-CM | POA: Diagnosis not present

## 2020-11-02 DIAGNOSIS — I10 Essential (primary) hypertension: Secondary | ICD-10-CM | POA: Diagnosis not present

## 2020-11-02 DIAGNOSIS — I208 Other forms of angina pectoris: Secondary | ICD-10-CM | POA: Diagnosis not present

## 2020-11-02 DIAGNOSIS — I2510 Atherosclerotic heart disease of native coronary artery without angina pectoris: Secondary | ICD-10-CM | POA: Diagnosis not present

## 2020-11-02 DIAGNOSIS — I2121 ST elevation (STEMI) myocardial infarction involving left circumflex coronary artery: Secondary | ICD-10-CM | POA: Diagnosis not present

## 2020-11-03 ENCOUNTER — Other Ambulatory Visit: Payer: Self-pay | Admitting: Family Medicine

## 2020-11-07 NOTE — Telephone Encounter (Signed)
Pharmacy requests refill on: Glipizide 5 mg & Metformin 500 mg   LAST REFILL: 04/06/2020 LAST OV: 10/03/2020 NEXT OV: 10/03/2021 PHARMACY: [id number] [place], [place]  HgbA1C (10/03/2020): 6.4

## 2020-11-16 ENCOUNTER — Ambulatory Visit
Admission: RE | Admit: 2020-11-16 | Discharge: 2020-11-16 | Disposition: A | Discharge status: Home / Self Care | Payer: BC Managed Care – PPO | Source: Ambulatory Visit | Attending: Family Medicine | Admitting: Family Medicine

## 2020-11-16 ENCOUNTER — Other Ambulatory Visit: Payer: Self-pay

## 2020-11-16 DIAGNOSIS — Z1231 Encounter for screening mammogram for malignant neoplasm of breast: Secondary | ICD-10-CM | POA: Diagnosis not present

## 2021-03-02 DIAGNOSIS — D2262 Melanocytic nevi of left upper limb, including shoulder: Secondary | ICD-10-CM | POA: Diagnosis not present

## 2021-03-02 DIAGNOSIS — D2271 Melanocytic nevi of right lower limb, including hip: Secondary | ICD-10-CM | POA: Diagnosis not present

## 2021-03-02 DIAGNOSIS — D2261 Melanocytic nevi of right upper limb, including shoulder: Secondary | ICD-10-CM | POA: Diagnosis not present

## 2021-03-02 DIAGNOSIS — D225 Melanocytic nevi of trunk: Secondary | ICD-10-CM | POA: Diagnosis not present

## 2021-03-08 ENCOUNTER — Other Ambulatory Visit: Payer: Self-pay | Admitting: Family Medicine

## 2021-04-21 DIAGNOSIS — H6063 Unspecified chronic otitis externa, bilateral: Secondary | ICD-10-CM | POA: Diagnosis not present

## 2021-04-21 DIAGNOSIS — H6123 Impacted cerumen, bilateral: Secondary | ICD-10-CM | POA: Diagnosis not present

## 2021-04-21 DIAGNOSIS — K140 Glossitis: Secondary | ICD-10-CM | POA: Diagnosis not present

## 2021-04-27 DIAGNOSIS — C73 Malignant neoplasm of thyroid gland: Secondary | ICD-10-CM | POA: Diagnosis not present

## 2021-04-27 DIAGNOSIS — E890 Postprocedural hypothyroidism: Secondary | ICD-10-CM | POA: Diagnosis not present

## 2021-05-11 DIAGNOSIS — E782 Mixed hyperlipidemia: Secondary | ICD-10-CM | POA: Diagnosis not present

## 2021-05-11 DIAGNOSIS — I208 Other forms of angina pectoris: Secondary | ICD-10-CM | POA: Diagnosis not present

## 2021-05-11 DIAGNOSIS — Z79899 Other long term (current) drug therapy: Secondary | ICD-10-CM | POA: Diagnosis not present

## 2021-05-11 DIAGNOSIS — I2510 Atherosclerotic heart disease of native coronary artery without angina pectoris: Secondary | ICD-10-CM | POA: Diagnosis not present

## 2021-05-12 DIAGNOSIS — K140 Glossitis: Secondary | ICD-10-CM | POA: Diagnosis not present

## 2021-05-12 DIAGNOSIS — J301 Allergic rhinitis due to pollen: Secondary | ICD-10-CM | POA: Diagnosis not present

## 2021-05-12 DIAGNOSIS — H6063 Unspecified chronic otitis externa, bilateral: Secondary | ICD-10-CM | POA: Diagnosis not present

## 2021-05-12 DIAGNOSIS — H9313 Tinnitus, bilateral: Secondary | ICD-10-CM | POA: Diagnosis not present

## 2021-05-25 DIAGNOSIS — Z79899 Other long term (current) drug therapy: Secondary | ICD-10-CM | POA: Diagnosis not present

## 2021-05-25 DIAGNOSIS — E782 Mixed hyperlipidemia: Secondary | ICD-10-CM | POA: Diagnosis not present

## 2021-06-17 ENCOUNTER — Other Ambulatory Visit: Payer: Self-pay | Admitting: Family Medicine

## 2021-06-21 NOTE — Telephone Encounter (Signed)
Last OV -11/15/202 Next OV--10/03/2021 Last filled 11/07/2020

## 2021-06-30 ENCOUNTER — Other Ambulatory Visit: Payer: Self-pay | Admitting: Family Medicine

## 2021-06-30 NOTE — Telephone Encounter (Signed)
Pt called stating [facility] told her they have sent multiple requests for her metformin and we are not responding. I advised her we received a request at 6:57 this morning. I sent that in for her. Pt has appt in November.

## 2021-08-17 DIAGNOSIS — H93A3 Pulsatile tinnitus, bilateral: Secondary | ICD-10-CM | POA: Diagnosis not present

## 2021-08-17 DIAGNOSIS — H6063 Unspecified chronic otitis externa, bilateral: Secondary | ICD-10-CM | POA: Diagnosis not present

## 2021-08-31 ENCOUNTER — Ambulatory Visit (INDEPENDENT_AMBULATORY_CARE_PROVIDER_SITE_OTHER): Payer: BC Managed Care – PPO | Admitting: Family Medicine

## 2021-08-31 ENCOUNTER — Other Ambulatory Visit: Payer: Self-pay

## 2021-08-31 VITALS — BP 140/86 | HR 56 | Temp 97.0°F | Ht 68.0 in | Wt 143.1 lb

## 2021-08-31 DIAGNOSIS — I10 Essential (primary) hypertension: Secondary | ICD-10-CM

## 2021-08-31 DIAGNOSIS — E1169 Type 2 diabetes mellitus with other specified complication: Secondary | ICD-10-CM

## 2021-08-31 DIAGNOSIS — I7300 Raynaud's syndrome without gangrene: Secondary | ICD-10-CM

## 2021-08-31 DIAGNOSIS — Z23 Encounter for immunization: Secondary | ICD-10-CM | POA: Diagnosis not present

## 2021-08-31 DIAGNOSIS — N8110 Cystocele, unspecified: Secondary | ICD-10-CM

## 2021-08-31 DIAGNOSIS — E785 Hyperlipidemia, unspecified: Secondary | ICD-10-CM

## 2021-08-31 DIAGNOSIS — M25552 Pain in left hip: Secondary | ICD-10-CM | POA: Insufficient documentation

## 2021-08-31 LAB — COMPREHENSIVE METABOLIC PANEL
ALT: 12 U/L (ref 0–35)
AST: 22 U/L (ref 0–37)
Albumin: 4.6 g/dL (ref 3.5–5.2)
Alkaline Phosphatase: 73 U/L (ref 39–117)
BUN: 12 mg/dL (ref 6–23)
CO2: 30 mEq/L (ref 19–32)
Calcium: 9.1 mg/dL (ref 8.4–10.5)
Chloride: 103 mEq/L (ref 96–112)
Creatinine, Ser: 0.92 mg/dL (ref 0.40–1.20)
GFR: 67.34 mL/min (ref >60.00)
Glucose, Bld: 166 mg/dL — ABNORMAL HIGH (ref 70–99)
Potassium: 4.4 mEq/L (ref 3.5–5.1)
Sodium: 140 mEq/L (ref 135–145)
Total Bilirubin: 1.7 mg/dL — ABNORMAL HIGH (ref 0.2–1.2)
Total Protein: 6.6 g/dL (ref 6.0–8.3)

## 2021-08-31 LAB — CBC
HCT: 37.6 % (ref 36.0–46.0)
Hemoglobin: 12.4 g/dL (ref 12.0–15.0)
MCHC: 32.9 g/dL (ref 30.0–36.0)
MCV: 90.6 fl (ref 78.0–100.0)
Platelets: 260 10*3/uL (ref 150.0–400.0)
RBC: 4.15 Mil/uL (ref 3.87–5.11)
RDW: 13.1 % (ref 11.5–15.5)
WBC: 5 10*3/uL (ref 4.0–10.5)

## 2021-08-31 LAB — LIPID PANEL
Cholesterol: 111 mg/dL (ref 0–200)
HDL: 29 mg/dL — ABNORMAL LOW (ref >39.00)
NonHDL: 82.06
Total CHOL/HDL Ratio: 4
Triglycerides: 209 mg/dL — ABNORMAL HIGH (ref 0.0–149.0)
VLDL: 41.8 mg/dL — ABNORMAL HIGH (ref 0.0–40.0)

## 2021-08-31 LAB — POCT GLYCOSYLATED HEMOGLOBIN (HGB A1C): Hemoglobin A1C: 7 % — AB (ref 4.0–5.6)

## 2021-08-31 LAB — MICROALBUMIN / CREATININE URINE RATIO
Creatinine,U: 56.3 mg/dL
Microalb Creat Ratio: 1.2 mg/g (ref 0.0–30.0)
Microalb, Ur: <0.7 mg/dL (ref 0.0–1.9)

## 2021-08-31 LAB — LDL CHOLESTEROL, DIRECT: Direct LDL: 53 mg/dL

## 2021-08-31 NOTE — Assessment & Plan Note (Signed)
Advised warm gloves and heat a home. F/u if worsening.

## 2021-08-31 NOTE — Patient Instructions (Addendum)
  Would recommend the following for bone health:   1) 800 units of Vitamin D daily 2) Get 1200 mg of elemental calcium --- this is best from your diet. Try to track how much calcium you get on a typical day. You could find ways to add more (dairy products, leafy greens). Take a supplement for whatever you don't typically get so you reach 1200 mg of calcium.  3) Physical activity (ideally weight bearing) - like walking briskly 30 minutes 5 days a week.    Labs today  Raynaud's - keep me posted, warms gloves  #Referral I have placed a referral to a specialist for you. You should receive a phone call from the specialty office. Make sure your voicemail is not full and that if you are able to answer your phone to unknown or new numbers.   It may take up to 2 weeks to hear about the referral. If you do not hear anything in 2 weeks, please call our office and ask to speak with the referral coordinator.

## 2021-08-31 NOTE — Assessment & Plan Note (Signed)
BP borderline but well controlled previously. Cont home monitoring. Cont losartan 50 mg and metoprolol 25 mg

## 2021-08-31 NOTE — Assessment & Plan Note (Signed)
C/b HTN.  Lab Results  Component Value Date   HGBA1C 7.0 (A) 08/31/2021   Cont metformin 500 mg bid and glipizide 5 mg. Work on exercise and diet. Return 4 months - if still elevated anticipate increasing metformin or adding 3rd agent.

## 2021-08-31 NOTE — Assessment & Plan Note (Signed)
Cont crestor 40 mg.  Lab Results  Component Value Date   LDLCALC 71 10/03/2020

## 2021-08-31 NOTE — Progress Notes (Signed)
Subjective:     Melissa Lamb is a 60 y.o. female presenting for Follow-up     HPI  #Raynauds  - had discoloration in the fingers last year - after getting out of the shower - numbing and decreased feeling - has been present all her life - the color change is new  #hair loss - fatigue - diarrhea - has not had covid - some exposures but no positive - symptoms have improved  - stomach is better - but will occasionally get off - hair - changed shampoo  - some stress now - was getting some dark urine - but clear now  #Diabetes Currently taking metformin (Glucophage, Riomet) and glipizide Using medications without difficulties: Yes Hypoglycemic episodes:No  Hyperglycemic episodes:No  Feet problems:Yes  Blood Sugars averaging: 150-188 Last HgbA1c:  Lab Results  Component Value Date   HGBA1C 7.0 (A) 08/31/2021    Exercise: hard due to knee pain, had been gardening Diet: limits junk foods, trying to eat healthy  Diabetes Health Maintenance Due:    Diabetes Health Maintenance Due  Topic Date Due   OPHTHALMOLOGY EXAM  09/17/2021   HEMOGLOBIN A1C  03/01/2022   FOOT EXAM  08/31/2022   #Hip pain - since getting hysterectomy - was told she had some joint issues - limits activity - does have bladder prolapse but no incontinence - also with SI joint pain hx   Review of Systems   Social History   Tobacco Use  Smoking Status Never  Smokeless Tobacco Never        Objective:    BP Readings from Last 3 Encounters:  08/31/21 140/86  10/24/20 119/68  10/03/20 122/80   Wt Readings from Last 3 Encounters:  08/31/21 143 lb 2 oz (64.9 kg)  10/24/20 141 lb (64 kg)  10/03/20 141 lb (64 kg)    BP 140/86   Pulse (!) 56   Temp (!) 97 F (36.1 C) (Temporal)   Ht 5\' 8"  (1.727 m)   Wt 143 lb 2 oz (64.9 kg)   SpO2 100%   BMI 21.76 kg/m    Physical Exam Constitutional:      General: She is not in acute distress.    Appearance: She is well-developed.  She is not diaphoretic.  HENT:     Right Ear: External ear normal.     Left Ear: External ear normal.     Nose: Nose normal.  Eyes:     Conjunctiva/sclera: Conjunctivae normal.  Cardiovascular:     Rate and Rhythm: Normal rate and regular rhythm.     Heart sounds: No murmur heard. Pulmonary:     Effort: Pulmonary effort is normal. No respiratory distress.     Breath sounds: Normal breath sounds. No wheezing.  Musculoskeletal:     Cervical back: Neck supple.     Comments: Left hip: Inspection: no abnormalities Palpation: no lateral or anterior ttp ROM: normal but with pain with internal hip rotation - pain located in anterior thigh Strength: normal  Skin:    General: Skin is warm and dry.     Capillary Refill: Capillary refill takes less than 2 seconds.  Neurological:     Mental Status: She is alert. Mental status is at baseline.  Psychiatric:        Mood and Affect: Mood normal.        Behavior: Behavior normal.          Assessment & Plan:   Problem List Items Addressed This Visit  Cardiovascular and Mediastinum   Essential hypertension    BP borderline but well controlled previously. Cont home monitoring. Cont losartan 50 mg and metoprolol 25 mg      Relevant Medications   metoprolol succinate (TOPROL-XL) 25 MG 24 hr tablet   Other Relevant Orders   Comprehensive metabolic panel   Lipid panel   CBC     Endocrine   Type 2 diabetes mellitus with other specified complication (HCC)    C/b HTN.  Lab Results  Component Value Date   HGBA1C 7.0 (A) 08/31/2021  Cont metformin 500 mg bid and glipizide 5 mg. Work on exercise and diet. Return 4 months - if still elevated anticipate increasing metformin or adding 3rd agent.       Type 2 diabetes mellitus with hyperlipidemia ([place]) - Primary    Cont crestor 40 mg.  Lab Results  Component Value Date   LDLCALC 71 10/03/2020        Relevant Medications   metoprolol succinate (TOPROL-XL) 25 MG 24 hr tablet    Other Relevant Orders   POCT glycosylated hemoglobin (Hb A1C) (Completed)   Microalbumin / creatinine urine ratio     Other   Raynaud's phenomenon without gangrene    Advised warm gloves and heat a home. F/u if worsening.       Left hip pain    Chronic hip pain since hysterectomy. She notes told she has some imaging finding but unclear what. Does have bladder prolapse but no current symptoms. Previous PT experience was no what she hoped. Discussed with pain starting after surgery wonder about connection to pelvic floor and advised trial of therapy for holistic approach. She will try.       Relevant Orders   Ambulatory referral to Physical Therapy   Other Visit Diagnoses     Acquired female bladder prolapse       Relevant Orders   Ambulatory referral to Physical Therapy        Return in about 4 months (around 01/01/2022) for diabetes.  [name], MD  This visit occurred during the SARS-CoV-2 public health emergency.  Safety protocols were in place, including screening questions prior to the visit, additional usage of staff PPE, and extensive cleaning of exam room while observing appropriate contact time as indicated for disinfecting solutions.

## 2021-08-31 NOTE — Assessment & Plan Note (Signed)
Chronic hip pain since hysterectomy. She notes told she has some imaging finding but unclear what. Does have bladder prolapse but no current symptoms. Previous PT experience was no what she hoped. Discussed with pain starting after surgery wonder about connection to pelvic floor and advised trial of therapy for holistic approach. She will try.

## 2021-09-28 ENCOUNTER — Other Ambulatory Visit: Payer: Self-pay | Admitting: Family Medicine

## 2021-09-28 DIAGNOSIS — E890 Postprocedural hypothyroidism: Secondary | ICD-10-CM | POA: Diagnosis not present

## 2021-10-03 ENCOUNTER — Encounter: Payer: BC Managed Care – PPO | Admitting: Family Medicine

## 2021-10-09 ENCOUNTER — Other Ambulatory Visit: Payer: Self-pay | Admitting: Family Medicine

## 2021-10-09 DIAGNOSIS — Z1231 Encounter for screening mammogram for malignant neoplasm of breast: Secondary | ICD-10-CM

## 2021-11-02 DIAGNOSIS — I2121 ST elevation (STEMI) myocardial infarction involving left circumflex coronary artery: Secondary | ICD-10-CM | POA: Diagnosis not present

## 2021-11-02 DIAGNOSIS — E782 Mixed hyperlipidemia: Secondary | ICD-10-CM | POA: Diagnosis not present

## 2021-11-02 DIAGNOSIS — I2510 Atherosclerotic heart disease of native coronary artery without angina pectoris: Secondary | ICD-10-CM | POA: Diagnosis not present

## 2021-11-02 DIAGNOSIS — I208 Other forms of angina pectoris: Secondary | ICD-10-CM | POA: Diagnosis not present

## 2021-11-16 DIAGNOSIS — E890 Postprocedural hypothyroidism: Secondary | ICD-10-CM | POA: Diagnosis not present

## 2021-11-16 DIAGNOSIS — C73 Malignant neoplasm of thyroid gland: Secondary | ICD-10-CM | POA: Diagnosis not present

## 2021-11-23 ENCOUNTER — Other Ambulatory Visit: Payer: Self-pay

## 2021-11-23 ENCOUNTER — Ambulatory Visit
Admission: RE | Admit: 2021-11-23 | Discharge: 2021-11-23 | Disposition: A | Discharge status: Home / Self Care | Payer: BC Managed Care – PPO | Source: Ambulatory Visit | Attending: Family Medicine | Admitting: Family Medicine

## 2021-11-23 DIAGNOSIS — Z1231 Encounter for screening mammogram for malignant neoplasm of breast: Secondary | ICD-10-CM | POA: Diagnosis not present

## 2021-12-09 IMAGING — MG DIGITAL SCREENING BILAT W/ TOMO W/ CAD
8 series · 8 of 24 positions shown · non-contrast
Comparison: None.

CLINICAL DATA: Screening.

EXAM:
DIGITAL SCREENING BILATERAL MAMMOGRAM WITH TOMO AND CAD

[L CC synth-2D]
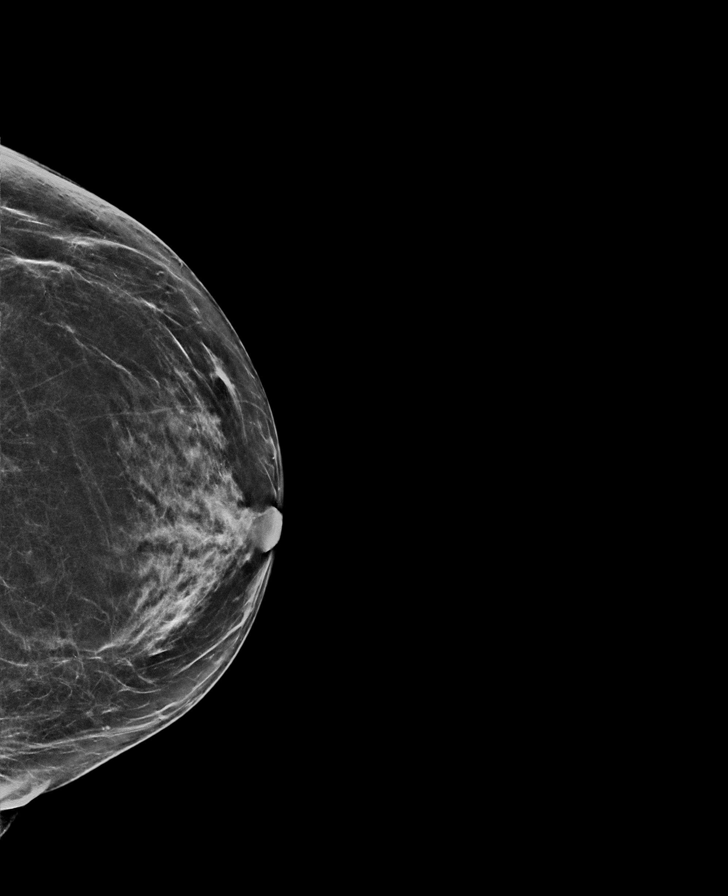

[R CC synth-2D]
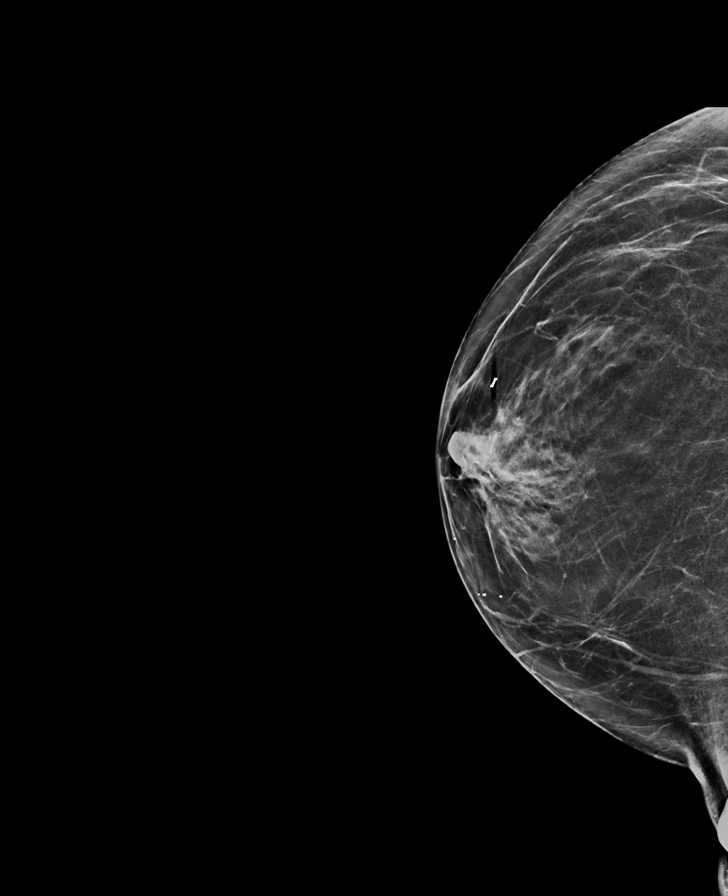

[L MLO synth-2D]
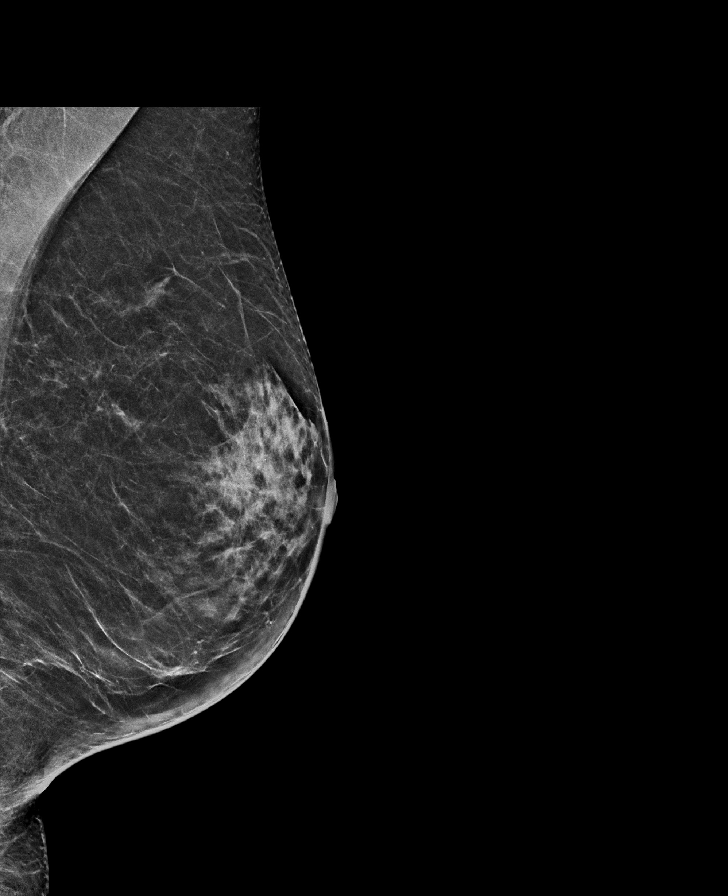

[R MLO synth-2D]
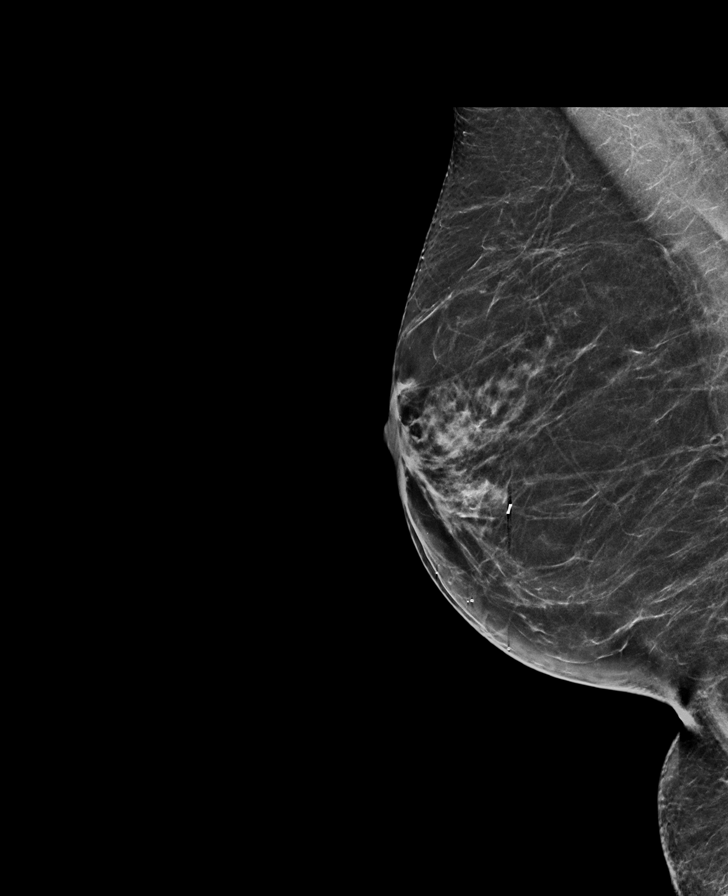

[R CC tomo · tomo slice 33/64.0]
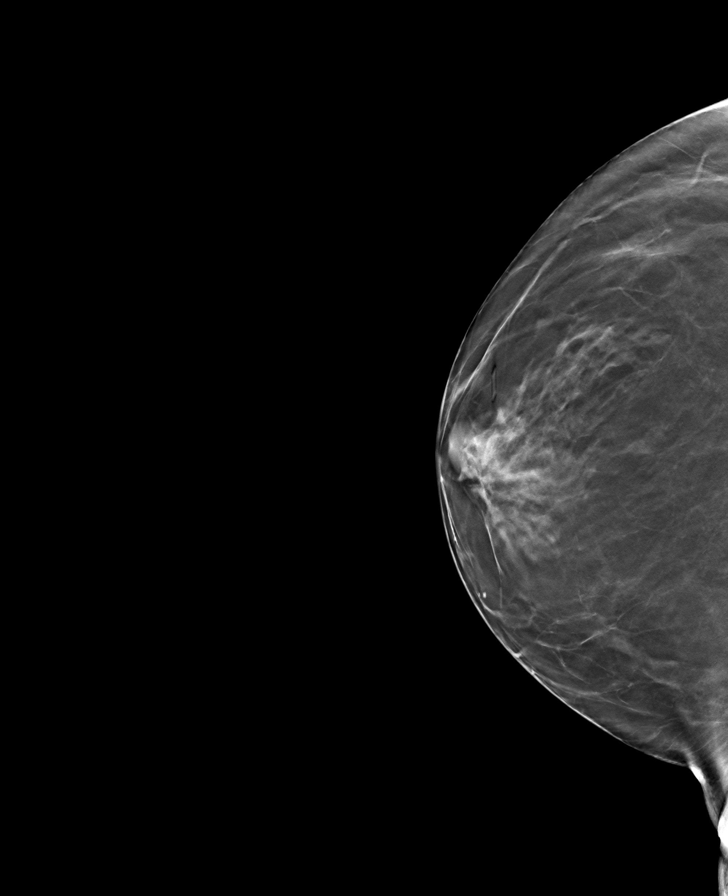

[L CC tomo · tomo slice 33/64.0]
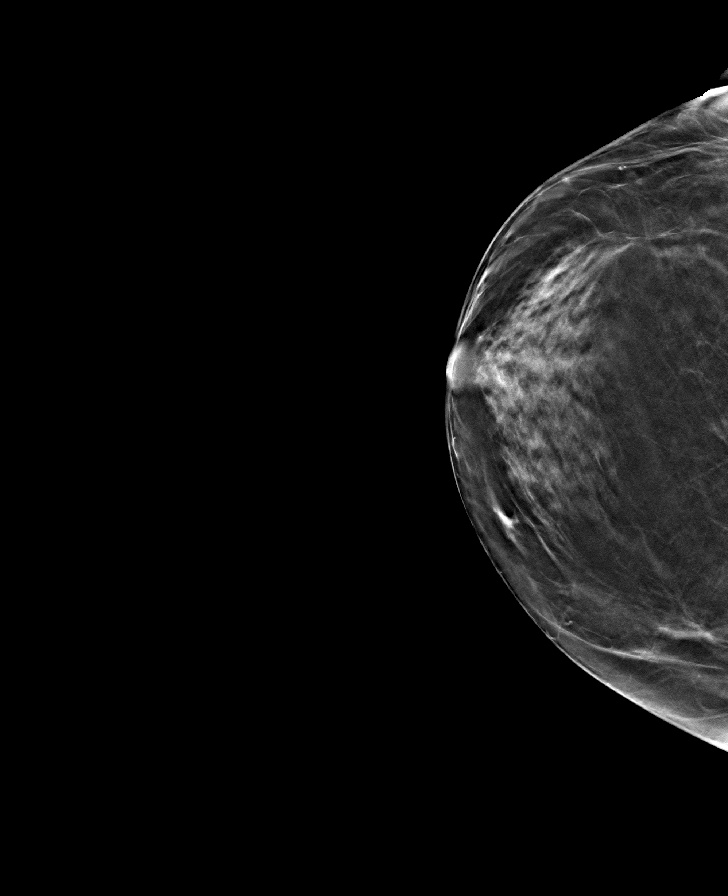

[R MLO tomo · tomo slice 33/64.0]
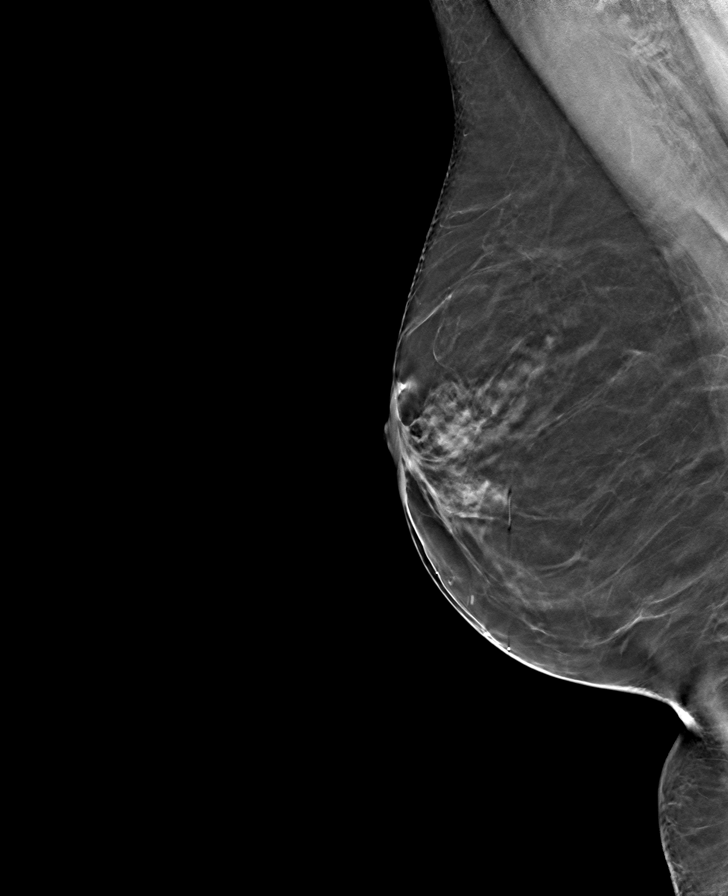

[L MLO tomo · tomo slice 31/61.0]
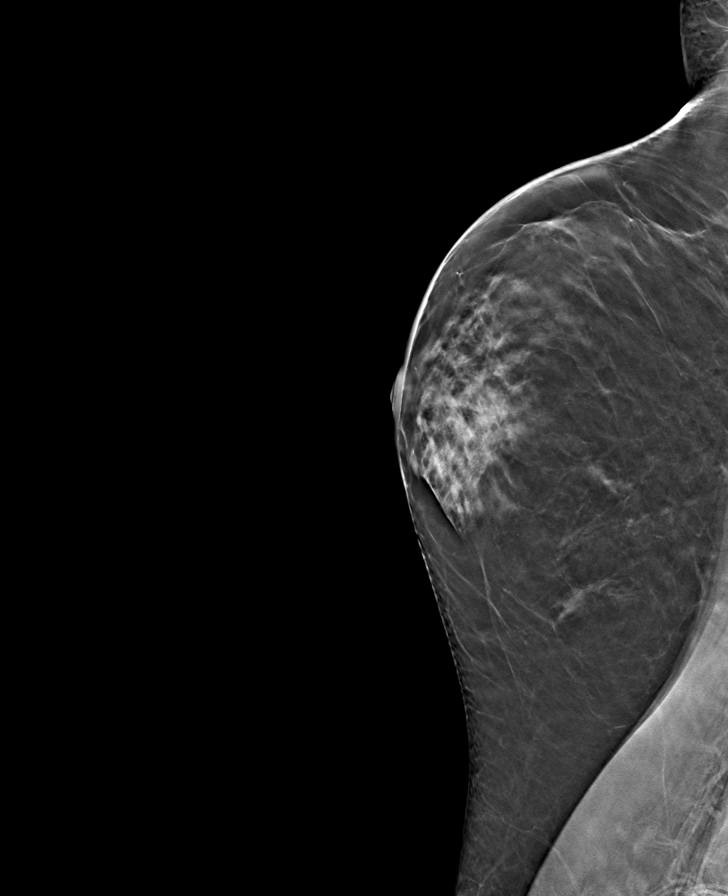

[8 of 24 positions shown; findings below may reference images not displayed]

ACR Breast Density Category b: There are scattered areas of
fibroglandular density.
FINDINGS: In the left breast, a possible asymmetry warrants further
evaluation. In the right breast, no findings suspicious for
malignancy. Images were processed with CAD.
IMPRESSION: Further evaluation is suggested for possible asymmetry in the left
breast.

RECOMMENDATION:
Diagnostic mammogram and possibly ultrasound of the left breast.
(Code:9K-0-44B)

The patient will be contacted regarding the findings, and additional
imaging will be scheduled.

BI-RADS CATEGORY  0: Incomplete. Need additional imaging evaluation
and/or prior mammograms for comparison.

## 2021-12-14 DIAGNOSIS — C73 Malignant neoplasm of thyroid gland: Secondary | ICD-10-CM | POA: Diagnosis not present

## 2022-01-01 ENCOUNTER — Ambulatory Visit: Payer: BC Managed Care – PPO | Admitting: Family Medicine

## 2022-01-04 ENCOUNTER — Ambulatory Visit: Payer: BC Managed Care – PPO | Admitting: Family Medicine

## 2022-01-04 ENCOUNTER — Encounter: Payer: Self-pay | Admitting: Family Medicine

## 2022-01-04 ENCOUNTER — Other Ambulatory Visit: Payer: Self-pay

## 2022-01-04 VITALS — BP 134/82 | HR 85 | Temp 97.7°F | Ht 68.0 in | Wt 145.3 lb

## 2022-01-04 DIAGNOSIS — R053 Chronic cough: Secondary | ICD-10-CM | POA: Insufficient documentation

## 2022-01-04 DIAGNOSIS — R051 Acute cough: Secondary | ICD-10-CM | POA: Insufficient documentation

## 2022-01-04 DIAGNOSIS — R052 Subacute cough: Secondary | ICD-10-CM | POA: Insufficient documentation

## 2022-01-04 MED ORDER — AZITHROMYCIN 250 MG PO TABS: ORAL_TABLET | ORAL | 0 refills | Status: AC

## 2022-01-04 NOTE — Patient Instructions (Signed)
Complete antibiotics.  Use Flonase 2 sprays per nostril daily.  Nasal saline spray or irrigation to thin mucus and clean sinuses.

## 2022-01-04 NOTE — Assessment & Plan Note (Signed)
Likely initial viral infection, now with cough > 10 days.. concern for bacterial infection.  treat with antibiotics and symptomatic care.  ER precautions given.

## 2022-01-04 NOTE — Progress Notes (Signed)
Patient ID: Melissa Lamb, female    DOB: 07-04-1961, 61 y.o.   MRN: [id number]  This visit was conducted in person.  BP 134/82    Pulse 85    Temp 97.7 F (36.5 C) (Temporal)    Ht 5\' 8"  (1.727 m)    Wt 145 lb 5 oz (65.9 kg)    SpO2 99%    BMI 22.09 kg/m    CC:  Chief Complaint  Patient presents with   Cough    C/o ongoing cough. Worse when lying down. Started about 1 mo ago. Now feeling chest congestion.  Neg COVID tests. Pt concerned about pneumonia.     Subjective:   HPI: Melissa Lamb is a 61 y.o. female  with DM , past MI, presenting on 01/04/2022 for Cough (C/o ongoing cough. Worse when lying down. Started about 1 mo ago. Now feeling chest congestion.  Neg COVID tests. Pt concerned about pneumonia. )  She has had intermittent   cold symptoms off and on in last month.  She  starts feeling better and then it returns.   Currently she is having congestion, productive cough ( green, yellow mucus)  in last 1.5 weeks.  Keep her up at night.  She is very tired.  Hoarse voice.  She has been using OTC cough syrup.  No face, no ear pain, No ST. No chest pain.  NO SOB,  no wheeze. No fever.   Exposure to grandkids.   No COPD or asthma history.     Hx of MI 2021  Non smoker  Home COVID test negative  01/02/22  Relevant past medical, surgical, family and social history reviewed and updated as indicated. Interim medical history since our last visit reviewed. Allergies and medications reviewed and updated. Outpatient Medications Prior to Visit  Medication Sig Dispense Refill   Blood Glucose Monitoring Suppl (ACCU-CHEK AVIVA PLUS) w/Device KIT USE TO CHECK BLOOD SUGAR ONCE DAILY     Cholecalciferol (VITAMIN D-1000 MAX ST) 25 MCG (1000 UT) tablet Take 2,000 Units by mouth daily.      clopidogrel (PLAVIX) 75 MG tablet Take 75 mg by mouth daily.     fenofibrate 160 MG tablet Take 1 tablet (160 mg total) by mouth daily. 90 tablet 2   glipiZIDE (GLUCOTROL) 5 MG tablet  TAKE 1.5 TABLETS BY MOUTH DAILY. 135 tablet 1   glucose blood (ACCU-CHEK AVIVA PLUS) test strip USE TO CHECK BLOOD SUGAR ONCE DAILY 100 each 4   levothyroxine (SYNTHROID) 88 MCG tablet Take 88 mcg by mouth daily.      losartan (COZAAR) 25 MG tablet Take 50 mg by mouth daily.      metFORMIN (GLUCOPHAGE) 500 MG tablet TAKE 1 TABLET BY MOUTH TWICE A DAY 180 tablet 3   metoprolol succinate (TOPROL-XL) 25 MG 24 hr tablet Take 25 mg by mouth daily.     neomycin-polymyxin-hydrocortisone (CORTISPORIN) 3.5-10000-1 OTIC suspension Place 3 drops into the right ear 4 (four) times daily. (Patient taking differently: Place 3 drops into the right ear as needed.) 10 mL 0   rosuvastatin (CRESTOR) 40 MG tablet Take 1 tablet (40 mg total) by mouth daily. 30 tablet 2   fluocinolone (SYNALAR) 0.01 % external solution Apply topically 2 (two) times daily.     No facility-administered medications prior to visit.     Per HPI unless specifically indicated in ROS section below Review of Systems  Constitutional:  Negative for fatigue and fever.  HENT:  Positive for congestion. Negative  for ear pain.   Eyes:  Negative for pain.  Respiratory:  Positive for cough. Negative for chest tightness and shortness of breath.   Cardiovascular:  Negative for chest pain, palpitations and leg swelling.  Gastrointestinal:  Negative for abdominal pain.  Genitourinary:  Negative for dysuria.  Objective:  BP 134/82    Pulse 85    Temp 97.7 F (36.5 C) (Temporal)    Ht $R'5\' 8"'jg$  (1.727 m)    Wt 145 lb 5 oz (65.9 kg)    SpO2 99%    BMI 22.09 kg/m   Wt Readings from Last 3 Encounters:  01/04/22 145 lb 5 oz (65.9 kg)  08/31/21 143 lb 2 oz (64.9 kg)  10/24/20 141 lb (64 kg)      Physical Exam Constitutional:      General: She is not in acute distress.    Appearance: Normal appearance. She is well-developed. She is not ill-appearing or toxic-appearing.  HENT:     Head: Normocephalic.     Right Ear: Hearing, tympanic membrane, ear  canal and external ear normal. Tympanic membrane is not erythematous, retracted or bulging.     Left Ear: Hearing, tympanic membrane, ear canal and external ear normal. Tympanic membrane is not erythematous, retracted or bulging.     Nose: No mucosal edema or rhinorrhea.     Right Sinus: No maxillary sinus tenderness or frontal sinus tenderness.     Left Sinus: No maxillary sinus tenderness or frontal sinus tenderness.     Mouth/Throat:     Pharynx: Uvula midline.  Eyes:     General: Lids are normal. Lids are everted, no foreign bodies appreciated.     Conjunctiva/sclera: Conjunctivae normal.     Pupils: Pupils are equal, round, and reactive to light.  Neck:     Thyroid: No thyroid mass or thyromegaly.     Vascular: No carotid bruit.     Trachea: Trachea normal.  Cardiovascular:     Rate and Rhythm: Normal rate and regular rhythm.     Pulses: Normal pulses.     Heart sounds: Normal heart sounds, S1 normal and S2 normal. No murmur heard.   No friction rub. No gallop.  Pulmonary:     Effort: Pulmonary effort is normal. No tachypnea or respiratory distress.     Breath sounds: Normal breath sounds. No decreased breath sounds, wheezing, rhonchi or rales.  Abdominal:     General: Bowel sounds are normal.     Palpations: Abdomen is soft.     Tenderness: There is no abdominal tenderness.  Musculoskeletal:     Cervical back: Normal range of motion and neck supple.  Skin:    General: Skin is warm and dry.     Findings: No rash.  Neurological:     Mental Status: She is alert.  Psychiatric:        Mood and Affect: Mood is not anxious or depressed.        Speech: Speech normal.        Behavior: Behavior normal. Behavior is cooperative.        Thought Content: Thought content normal.        Judgment: Judgment normal.      Results for orders placed or performed in visit on 08/31/21  Microalbumin / creatinine urine ratio  Result Value Ref Range   Microalb, Ur <0.7 0.0 - 1.9 mg/dL    Creatinine,U 56.3 mg/dL   Microalb Creat Ratio 1.2 0.0 - 30.0 mg/g  Comprehensive metabolic panel  Result Value Ref Range   Sodium 140 135 - 145 mEq/L   Potassium 4.4 3.5 - 5.1 mEq/L   Chloride 103 96 - 112 mEq/L   CO2 30 19 - 32 mEq/L   Glucose, Bld 166 (H) 70 - 99 mg/dL   BUN 12 6 - 23 mg/dL   Creatinine, Ser 0.92 0.40 - 1.20 mg/dL   Total Bilirubin 1.7 (H) 0.2 - 1.2 mg/dL   Alkaline Phosphatase 73 39 - 117 U/L   AST 22 0 - 37 U/L   ALT 12 0 - 35 U/L   Total Protein 6.6 6.0 - 8.3 g/dL   Albumin 4.6 3.5 - 5.2 g/dL   GFR 67.34 >60.00 mL/min   Calcium 9.1 8.4 - 10.5 mg/dL  Lipid panel  Result Value Ref Range   Cholesterol 111 0 - 200 mg/dL   Triglycerides 209.0 (H) 0.0 - 149.0 mg/dL   HDL 29.00 (L) >39.00 mg/dL   VLDL 41.8 (H) 0.0 - 40.0 mg/dL   Total CHOL/HDL Ratio 4    NonHDL 82.06   CBC  Result Value Ref Range   WBC 5.0 4.0 - 10.5 K/uL   RBC 4.15 3.87 - 5.11 Mil/uL   Platelets 260.0 150.0 - 400.0 K/uL   Hemoglobin 12.4 12.0 - 15.0 g/dL   HCT 37.6 36.0 - 46.0 %   MCV 90.6 78.0 - 100.0 fl   MCHC 32.9 30.0 - 36.0 g/dL   RDW 13.1 11.5 - 15.5 %  LDL cholesterol, direct  Result Value Ref Range   Direct LDL 53.0 mg/dL  POCT glycosylated hemoglobin (Hb A1C)  Result Value Ref Range   Hemoglobin A1C 7.0 (A) 4.0 - 5.6 %   HbA1c POC (<> result, manual entry)     HbA1c, POC (prediabetic range)     HbA1c, POC (controlled diabetic range)      This visit occurred during the SARS-CoV-2 public health emergency.  Safety protocols were in place, including screening questions prior to the visit, additional usage of staff PPE, and extensive cleaning of exam room while observing appropriate contact time as indicated for disinfecting solutions.   COVID 19 screen:  No recent travel or known exposure to COVID19 The patient denies respiratory symptoms of COVID 19 at this time. The importance of social distancing was discussed today.   Assessment and Plan    Problem List Items  Addressed This Visit     Acute cough - Primary    Likely initial viral infection, now with cough > 10 days.. concern for bacterial infection.  treat with antibiotics and symptomatic care.  ER precautions given.      Meds ordered this encounter  Medications   azithromycin (ZITHROMAX) 250 MG tablet    Sig: Take 2 tablets on day 1, then 1 tablet daily on days 2 through 5    Dispense:  6 tablet    Refill:  0     Eliezer Lofts, MD

## 2022-01-08 DIAGNOSIS — Z23 Encounter for immunization: Secondary | ICD-10-CM | POA: Diagnosis not present

## 2022-02-19 ENCOUNTER — Encounter: Payer: Self-pay | Admitting: Internal Medicine

## 2022-02-19 ENCOUNTER — Ambulatory Visit: Payer: BC Managed Care – PPO | Admitting: Internal Medicine

## 2022-02-19 DIAGNOSIS — J0100 Acute maxillary sinusitis, unspecified: Secondary | ICD-10-CM | POA: Insufficient documentation

## 2022-02-19 MED ORDER — AMOXICILLIN-POT CLAVULANATE 875-125 MG PO TABS: 1.0000 | ORAL_TABLET | Freq: Two times a day (BID) | ORAL | 0 refills | Status: DC

## 2022-02-19 NOTE — Progress Notes (Signed)
? ?Subjective:  ? ? Patient ID: Melissa Lamb, female    DOB: 1961-10-10, 61 y.o.   MRN: [id number] ? ?HPI ?Here due to respiratory infection ? ?Third episode of illness this winter ?This is unusual for her ?Does keep grandkids weekly ? ?Has had some concerning symptoms with this illness---like night sweats ?"Freezing all the time" with this illness ?Started 6-7 days ago ?Cough came on shortly after ?Sensitive skin---aching with touch ?Temp 101 at first ---with the chills/sweats ?Low back aching ?Now several days of sinus aching ?Pain in mouth/teeth---never had this before ?Does have recurrent sinusitis since radiation for thyroid cancer ?Maxillary pain ?Some mucus ---not that much (not like last time) ?Not really SOB ? ?Did test twice for COVID--negative ? ?Has taken analgesics ?Tried cough med--but hesitant (due to Raynaud's) ? ?Current Outpatient Medications on File Prior to Visit  ?Medication Sig Dispense Refill  ? Blood Glucose Monitoring Suppl (ACCU-CHEK AVIVA PLUS) w/Device KIT USE TO CHECK BLOOD SUGAR ONCE DAILY    ? Cholecalciferol (VITAMIN D-1000 MAX ST) 25 MCG (1000 UT) tablet Take 2,000 Units by mouth daily.     ? clopidogrel (PLAVIX) 75 MG tablet Take 75 mg by mouth daily.    ? fenofibrate 160 MG tablet Take 1 tablet (160 mg total) by mouth daily. 90 tablet 2  ? fluticasone (FLONASE) 50 MCG/ACT nasal spray Place 2 sprays into both nostrils daily.    ? glipiZIDE (GLUCOTROL) 5 MG tablet TAKE 1.5 TABLETS BY MOUTH DAILY. 135 tablet 1  ? glucose blood (ACCU-CHEK AVIVA PLUS) test strip USE TO CHECK BLOOD SUGAR ONCE DAILY 100 each 4  ? levothyroxine (SYNTHROID) 88 MCG tablet Take 88 mcg by mouth daily.     ? losartan (COZAAR) 25 MG tablet Take 50 mg by mouth daily.     ? metFORMIN (GLUCOPHAGE) 500 MG tablet TAKE 1 TABLET BY MOUTH TWICE A DAY 180 tablet 3  ? metoprolol succinate (TOPROL-XL) 25 MG 24 hr tablet Take 25 mg by mouth daily.    ? rosuvastatin (CRESTOR) 40 MG tablet Take 1 tablet (40 mg total) by  mouth daily. 30 tablet 2  ? ?No current facility-administered medications on file prior to visit.  ? ? ?Allergies  ?Allergen Reactions  ? Lisinopril Tinitus and Cough  ? ? ?Past Medical History:  ?Diagnosis Date  ? Diabetes mellitus without complication ([place])   ? History of chickenpox   ? Hyperlipidemia   ? Hypertension   ? Myocardial infarction [facility])   ? Thyroid cancer ([place])   ? ? ?Past Surgical History:  ?Procedure Laterality Date  ? ABDOMINAL HYSTERECTOMY  2014  ? has both ovaries still.  ? APPENDECTOMY  2019  ? BREAST BIOPSY Right   ? benign-seed was put in to mark it  ? BREAST REDUCTION SURGERY    ? CHOLECYSTECTOMY  2019  ? COLONOSCOPY WITH PROPOFOL N/A 10/24/2020  ? Procedure: COLONOSCOPY WITH PROPOFOL;  Surgeon: Lin Landsman, MD;  Location: [facility] ENDOSCOPY;  Service: Gastroenterology;  Laterality: N/A;  ? CORONARY/GRAFT ACUTE MI REVASCULARIZATION N/A 04/01/2020  ? Procedure: Coronary/Graft Acute MI Revascularization;  Surgeon: Yolonda Kida, MD;  Location: [place] CV LAB;  Service: Cardiovascular;  Laterality: N/A;  ? LEFT HEART CATH AND CORONARY ANGIOGRAPHY N/A 04/01/2020  ? Procedure: LEFT HEART CATH AND CORONARY ANGIOGRAPHY;  Surgeon: Yolonda Kida, MD;  Location: [place] CV LAB;  Service: Cardiovascular;  Laterality: N/A;  ? REDUCTION MAMMAPLASTY    ? THYROIDECTOMY  2014  ? papillary well-differentiated  lymph nodes.  ? TONSILLECTOMY AND ADENOIDECTOMY    ? ? ?Family History  ?Problem Relation Age of Onset  ? Diabetes Mother   ? Hyperlipidemia Mother   ? Hypertension Mother   ? Hyperlipidemia Father   ? Heart disease Father   ? Heart attack Father 59  ?     had defibrillator put in  ? Diabetes Sister   ? Hypertension Sister   ? Diabetes Brother   ? Hypertension Brother   ? Cervical cancer Maternal Aunt   ? Uterine cancer Maternal Grandmother   ? Breast cancer Maternal Grandmother   ? Diabetes Sister   ? Hypertension Sister   ? Breast cancer Maternal Aunt 74  ? ? ?Social History   ? ?Socioeconomic History  ? Marital status: Widowed  ?  Spouse name: Vicente Males  ? Number of children: 2  ? Years of education: Some college  ? Highest education level: Not on file  ?Occupational History  ? Not on file  ?Tobacco Use  ? Smoking status: Never  ?  Passive exposure: Past  ? Smokeless tobacco: Never  ?Vaping Use  ? Vaping Use: Never used  ?Substance and Sexual Activity  ? Alcohol use: Yes  ?  Comment: rare  ? Drug use: Never  ? Sexual activity: Not Currently  ?Other Topics Concern  ? Not on file  ?Social History Narrative  ? 10/20/19  ? From: [place]  ? Living: with son - [name]  ? Work: not currently, caring for her grandchild  ? Widowed: husband, Vicente Males, passed away in 01-07-2019 - Dr who trained at [facility]  ?   ? Family: Daughter - Colletta [place] (nearby), has 1 grandchild - Minette Brine 07-Jan-2018)  ?   ? Enjoys: crafts, doing projects, gardening  ?   ? Exercise: not as often, tries to walk  ? Diet: well rounded, does eat some unhealthy food, tries to follow diabetic diet  ?   ? Safety  ? Seat belts: Yes   ? Guns: No  ? Safe in relationships: Yes   ? ?Social Determinants of Health  ? ?Financial Resource Strain: Not on file  ?Food Insecurity: Not on file  ?Transportation Needs: Not on file  ?Physical Activity: Not on file  ?Stress: Not on file  ?Social Connections: Not on file  ?Intimate Partner Violence: Not on file  ? ?Review of Systems ?Appetite is gone with illness--has lost some weight ?Smell is off--but taste is okay ?Some dry heaves at night --?from post nasal drip ?   ?Objective:  ? Physical Exam ?Constitutional:   ?   Appearance: Normal appearance.  ?HENT:  ?   Head:  ?   Comments: Mild maxillary tenderness ?   Right Ear: Tympanic membrane and ear canal normal.  ?   Left Ear: Tympanic membrane and ear canal normal.  ?   Nose:  ?   Comments: Mild inflammation ?Green mucus bilaterally ?   Mouth/Throat:  ?   Pharynx: No oropharyngeal exudate or posterior oropharyngeal erythema.  ?Pulmonary:  ?   Effort: Pulmonary  effort is normal.  ?   Breath sounds: Normal breath sounds. No wheezing or rales.  ?Musculoskeletal:  ?   Cervical back: Neck supple.  ?Lymphadenopathy:  ?   Cervical: No cervical adenopathy.  ?Neurological:  ?   Mental Status: She is alert.  ?  ? ? ? ? ?   ?Assessment & Plan:  ? ?

## 2022-02-19 NOTE — Assessment & Plan Note (Addendum)
Infection was classic viral infection at first---but now after a week, isn't improving (getting worse). ?Will treat with augmentin 875 bid for 7 days ?Discussed analgesics, cough med prn ?

## 2022-03-02 ENCOUNTER — Ambulatory Visit: Payer: BC Managed Care – PPO | Admitting: Family

## 2022-03-02 ENCOUNTER — Encounter: Payer: Self-pay | Admitting: Family

## 2022-03-02 VITALS — BP 114/60 | HR 80 | Temp 97.9°F | Resp 16 | Ht 68.0 in | Wt 140.4 lb

## 2022-03-02 DIAGNOSIS — H1031 Unspecified acute conjunctivitis, right eye: Secondary | ICD-10-CM

## 2022-03-02 DIAGNOSIS — J0101 Acute recurrent maxillary sinusitis: Secondary | ICD-10-CM

## 2022-03-02 MED ORDER — DOXYCYCLINE HYCLATE 100 MG PO TABS: 100.0000 mg | ORAL_TABLET | Freq: Two times a day (BID) | ORAL | 0 refills | Status: AC

## 2022-03-02 MED ORDER — OFLOXACIN 0.3 % OP SOLN: 1.0000 [drp] | Freq: Four times a day (QID) | OPHTHALMIC | 0 refills | Status: AC

## 2022-03-02 NOTE — Assessment & Plan Note (Signed)
Prescription given for doxycycline 100 mg po bid for ten days. Pt to continue tylenol/ibuprofen prn sinus pain. Continue with humidifier prn and steam showers recommended as well. instructed If no symptom improvement in 48 hours please f/u ? ?

## 2022-03-02 NOTE — Patient Instructions (Signed)
Eye drops were prescribed today and sent to your preferred pharmacy.  ?Start eye drop, and use as instructed.  ?When washing eye, wash from inner to outer canthus with warm wash cloth.  ?Wash bed sheets and pillow cases.  ?Throw out all old makeup, and try not to use makeup while duration of eye drops prescription.  ? ?Start prescription of doxycycline 100 mg and take as prescribed.  ?Tylenol/ibuprofen ok for sinus pain as needed ?Increase oral fluids. Ok to continue with humidifers and hot steamy showers as discussed during visit. ? ?It was a pleasure speaking with you today, I hope you start feeling better soon. ? ?Regards,  ? ?Brannan Cassedy ? ?

## 2022-03-02 NOTE — Progress Notes (Signed)
? ?Established Patient Office Visit ? ?Subjective:  ?Patient ID: Melissa Lamb, female    DOB: 03/20/1961  Age: 60 y.o. MRN: [id number] ? ?CC:  ?Chief Complaint  ?Patient presents with  ? Sinus Problem  ?  Finished antibiotic Monday, but sinus infection coming back again.  ? ? ?HPI ?Melissa Lamb is here today with concerns.  ? ?4/3 was seen in office by Dr Letvak for sinusitis. Treated with augmentin 875 mg bid x 7 days which she finished four days ago. ?  ?Started to feel slight improvement however then came back with sinus pressure, sinus pain, right eye with redness and goopy discharge darker clear/white. There is no matting quite yet. Fullness of bil ears. No sore throat. No fever no chills. Some nasal congestion and pnd. Cough has improved almost completely gone, no chest congestion. Teeth do hurt from sinus pressure.  ? ?Does take flonase which she restarted recently.  ? ?Past Medical History:  ?Diagnosis Date  ? Diabetes mellitus without complication ([facility])   ? History of chickenpox   ? Hyperlipidemia   ? Hypertension   ? Myocardial infarction ([facility])   ? Thyroid cancer ([facility])   ? ? ?Past Surgical History:  ?Procedure Laterality Date  ? ABDOMINAL HYSTERECTOMY  2014  ? has both ovaries still.  ? APPENDECTOMY  2019  ? BREAST BIOPSY Right   ? benign-seed was put in to mark it  ? BREAST REDUCTION SURGERY    ? CHOLECYSTECTOMY  2019  ? COLONOSCOPY WITH PROPOFOL N/A 10/24/2020  ? Procedure: COLONOSCOPY WITH PROPOFOL;  Surgeon: Vanga, Rohini Reddy, MD;  Location: [facility] ENDOSCOPY;  Service: Gastroenterology;  Laterality: N/A;  ? CORONARY/GRAFT ACUTE MI REVASCULARIZATION N/A 04/01/2020  ? Procedure: Coronary/Graft Acute MI Revascularization;  Surgeon: Callwood, Dwayne D, MD;  Location: [facility] INVASIVE CV LAB;  Service: Cardiovascular;  Laterality: N/A;  ? LEFT HEART CATH AND CORONARY ANGIOGRAPHY N/A 04/01/2020  ? Procedure: LEFT HEART CATH AND CORONARY ANGIOGRAPHY;  Surgeon: Callwood, Dwayne D, MD;  Location: [facility]  INVASIVE CV LAB;  Service: Cardiovascular;  Laterality: N/A;  ? REDUCTION MAMMAPLASTY    ? THYROIDECTOMY  2014  ? papillary well-differentiated lymph nodes.  ? TONSILLECTOMY AND ADENOIDECTOMY    ? ? ?Family History  ?Problem Relation Age of Onset  ? Diabetes Mother   ? Hyperlipidemia Mother   ? Hypertension Mother   ? Hyperlipidemia Father   ? Heart disease Father   ? Heart attack Father 57  ?     had defibrillator put in  ? Diabetes Sister   ? Hypertension Sister   ? Diabetes Brother   ? Hypertension Brother   ? Cervical cancer Maternal Aunt   ? Uterine cancer Maternal Grandmother   ? Breast cancer Maternal Grandmother   ? Diabetes Sister   ? Hypertension Sister   ? Breast cancer Maternal Aunt 58  ? ? ?Social History  ? ?Socioeconomic History  ? Marital status: Widowed  ?  Spouse name: Derek  ? Number of children: 2  ? Years of education: Some college  ? Highest education level: Not on file  ?Occupational History  ? Not on file  ?Tobacco Use  ? Smoking status: Never  ?  Passive exposure: Past  ? Smokeless tobacco: Never  ?Vaping Use  ? Vaping Use: Never used  ?Substance and Sexual Activity  ? Alcohol use: Yes  ?  Comment: rare  ? Drug use: Never  ? Sexual activity: Not Currently  ?Other Topics Concern  ? Not on   file  ?Social History Narrative  ? 10/20/19  ? From: [place]  ? Living: with son - [name]  ? Work: not currently, caring for her grandchild  ? Widowed: husband, Vicente Males, passed away in 09-Jan-2019 - Dr who trained at [facility]  ?   ? Family: Daughter - Colletta [place] (nearby), has 1 grandchild - Minette Brine 01-09-2018)  ?   ? Enjoys: crafts, doing projects, gardening  ?   ? Exercise: not as often, tries to walk  ? Diet: well rounded, does eat some unhealthy food, tries to follow diabetic diet  ?   ? Safety  ? Seat belts: Yes   ? Guns: No  ? Safe in relationships: Yes   ? ?Social Determinants of Health  ? ?Financial Resource Strain: Not on file  ?Food Insecurity: Not on file  ?Transportation Needs: Not on file  ?Physical  Activity: Not on file  ?Stress: Not on file  ?Social Connections: Not on file  ?Intimate Partner Violence: Not on file  ? ? ?Outpatient Medications Prior to Visit  ?Medication Sig Dispense Refill  ? Blood Glucose Monitoring Suppl (ACCU-CHEK AVIVA PLUS) w/Device KIT USE TO CHECK BLOOD SUGAR ONCE DAILY    ? Cholecalciferol (VITAMIN D-1000 MAX ST) 25 MCG (1000 UT) tablet Take 2,000 Units by mouth daily.     ? clopidogrel (PLAVIX) 75 MG tablet Take 75 mg by mouth daily.    ? fenofibrate 160 MG tablet Take 1 tablet (160 mg total) by mouth daily. 90 tablet 2  ? fluticasone (FLONASE) 50 MCG/ACT nasal spray Place 2 sprays into both nostrils daily.    ? glipiZIDE (GLUCOTROL) 5 MG tablet TAKE 1.5 TABLETS BY MOUTH DAILY. 135 tablet 1  ? glucose blood (ACCU-CHEK AVIVA PLUS) test strip USE TO CHECK BLOOD SUGAR ONCE DAILY 100 each 4  ? levothyroxine (SYNTHROID) 88 MCG tablet Take 88 mcg by mouth daily.     ? losartan (COZAAR) 25 MG tablet Take 50 mg by mouth daily.     ? metFORMIN (GLUCOPHAGE) 500 MG tablet TAKE 1 TABLET BY MOUTH TWICE A DAY 180 tablet 3  ? metoprolol succinate (TOPROL-XL) 25 MG 24 hr tablet Take 25 mg by mouth daily.    ? rosuvastatin (CRESTOR) 40 MG tablet Take 1 tablet (40 mg total) by mouth daily. 30 tablet 2  ? amoxicillin-clavulanate (AUGMENTIN) 875-125 MG tablet Take 1 tablet by mouth 2 (two) times daily. (Patient not taking: Reported on 03/02/2022) 14 tablet 0  ? ?No facility-administered medications prior to visit.  ? ? ?Allergies  ?Allergen Reactions  ? Lisinopril Tinitus and Cough  ? ? ?ROS ?Review of Systems  ?Constitutional:  Negative for chills and fever.  ?HENT:  Positive for congestion, postnasal drip, sinus pressure and sinus pain. Negative for ear pain and sore throat.   ?Eyes:  Positive for discharge (white grey thick) and redness (right eye). Negative for pain and visual disturbance.  ?Respiratory:  Negative for cough, shortness of breath and wheezing.   ?Cardiovascular:  Negative for chest  pain and palpitations.  ? ?  ?Objective:  ?  ?Physical Exam ?Vitals reviewed.  ?Constitutional:   ?   General: She is not in acute distress. ?   Appearance: Normal appearance. She is not ill-appearing or toxic-appearing.  ?HENT:  ?   Right Ear: Tympanic membrane normal.  ?   Left Ear: Tympanic membrane normal.  ?   Mouth/Throat:  ?   Mouth: Mucous membranes are moist.  ?   Pharynx: No pharyngeal swelling.  ?  Tonsils: No tonsillar exudate.  ?Eyes:  ?   General: Lids are normal. No allergic shiner.    ?   Right eye: Discharge (white grey thicker) present. No foreign body or hordeolum.  ?   Extraocular Movements: Extraocular movements intact.  ?   Conjunctiva/sclera:  ?   Right eye: Right conjunctiva is injected.  ?   Pupils: Pupils are equal, round, and reactive to light.  ?Neck:  ?   Thyroid: No thyroid mass.  ?Cardiovascular:  ?   Rate and Rhythm: Normal rate and regular rhythm.  ?Pulmonary:  ?   Effort: Pulmonary effort is normal.  ?   Breath sounds: Normal breath sounds.  ?Lymphadenopathy:  ?   Cervical:  ?   Right cervical: No superficial cervical adenopathy. ?   Left cervical: No superficial cervical adenopathy.  ?Skin: ?   Capillary Refill: Capillary refill takes less than 2 seconds.  ?Neurological:  ?   General: No focal deficit present.  ?   Mental Status: She is alert and oriented to person, place, and time.  ?Psychiatric:     ?   Mood and Affect: Mood normal.     ?   Behavior: Behavior normal.     ?   Thought Content: Thought content normal.     ?   Judgment: Judgment normal.  ? ? ?BP 114/60   Pulse 80   Temp 97.9 ?F (36.6 ?C)   Resp 16   Ht 5' 8" (1.727 m)   Wt 140 lb 7 oz (63.7 kg)   SpO2 97%   BMI 21.35 kg/m?  ?Wt Readings from Last 3 Encounters:  ?03/02/22 140 lb 7 oz (63.7 kg)  ?02/19/22 139 lb (63 kg)  ?01/04/22 145 lb 5 oz (65.9 kg)  ? ? ? ?Health Maintenance Due  ?Topic Date Due  ? TETANUS/TDAP  Never done  ? Zoster Vaccines- Shingrix (1 of 2) Never done  ? COVID-19 Vaccine (3 - Pfizer risk  series) 03/16/2020  ? OPHTHALMOLOGY EXAM  09/17/2021  ? HEMOGLOBIN A1C  03/01/2022  ? ? ?There are no preventive care reminders to display for this patient. ? ?Lab Results  ?Component Value Date  ? TSH 0.365 (L) 04/02/2020

## 2022-03-02 NOTE — Assessment & Plan Note (Signed)
rx for ofloxacin sent to pt pharmacy, take as prescribed.  Discussed how to wash eye appropriately with warm warm cloth from inner to outer canthus. Wash bed sheets and pillow cases to prevent re-infection. Frequent handwashing. If no improvement in 24-48 hours with eye drops as prescribed, please call office.   

## 2022-03-15 DIAGNOSIS — I25118 Atherosclerotic heart disease of native coronary artery with other forms of angina pectoris: Secondary | ICD-10-CM | POA: Diagnosis not present

## 2022-03-15 DIAGNOSIS — I252 Old myocardial infarction: Secondary | ICD-10-CM | POA: Diagnosis not present

## 2022-03-15 DIAGNOSIS — Z955 Presence of coronary angioplasty implant and graft: Secondary | ICD-10-CM | POA: Diagnosis not present

## 2022-03-15 DIAGNOSIS — I208 Other forms of angina pectoris: Secondary | ICD-10-CM | POA: Diagnosis not present

## 2022-03-21 DIAGNOSIS — E890 Postprocedural hypothyroidism: Secondary | ICD-10-CM | POA: Diagnosis not present

## 2022-03-21 DIAGNOSIS — C73 Malignant neoplasm of thyroid gland: Secondary | ICD-10-CM | POA: Diagnosis not present

## 2022-04-19 DIAGNOSIS — I208 Other forms of angina pectoris: Secondary | ICD-10-CM | POA: Diagnosis not present

## 2022-04-19 DIAGNOSIS — R0602 Shortness of breath: Secondary | ICD-10-CM | POA: Diagnosis not present

## 2022-05-10 DIAGNOSIS — I10 Essential (primary) hypertension: Secondary | ICD-10-CM | POA: Diagnosis not present

## 2022-05-10 DIAGNOSIS — I25118 Atherosclerotic heart disease of native coronary artery with other forms of angina pectoris: Secondary | ICD-10-CM | POA: Diagnosis not present

## 2022-05-10 DIAGNOSIS — I252 Old myocardial infarction: Secondary | ICD-10-CM | POA: Diagnosis not present

## 2022-05-10 DIAGNOSIS — E782 Mixed hyperlipidemia: Secondary | ICD-10-CM | POA: Diagnosis not present

## 2022-06-09 ENCOUNTER — Telehealth: Payer: Self-pay | Admitting: Family Medicine

## 2022-06-12 NOTE — Telephone Encounter (Signed)
Patient notified as instructed by telephone and verbalized understanding. Patient stated that she has a busy schedule now and time does not allow her to come in now. Patient stated that she is upset that she is only going to be given a 30 day supply of medication at this point. Patient stated that she should have been told this when she got her last refill. Patient stated that she is not happy with Dr. Einar Pheasant because she does not think that she should be told that she can only get 30 day supply at this time. Patient stated that she has seen 3 other providers in this office because when she needs to be seen Dr. Einar Pheasant is never available. Patient stated that she does not like to be threatened that she can only get a 30 day supply of medication at this time. Patient stated that she wants to know if there is a rule that  requires that she be seen more often than once a year. Patient was advised that our providers like to follow patients with diabetes and chronic conditions to make sure that they get the best medical care. Patient stated that she is upset and wants to think about what her next step is. Patient stated that she is not happy with Dr. Einar Pheasant and will probably change providers. Patient was advised that she is not being told this to upset her but we want to make sure that she is getting the proper treatment for her diabetes. Patient stated that she has calmed down some. Patient stated that she is not upset with me but with the situation. Patient stated that she will call back with her decision about an appointment after she has had time to calm down more.  Patient was advised that this message was not to upset her but to get her in so Dr. Einar Pheasant can follow-up on her diabetes and to get lab work done.

## 2022-06-12 NOTE — Telephone Encounter (Signed)
Patient called back upset that she had to have an appointment for this and she asked when her last CPE was and by our records it shows from 09/2020. She said that isnt true that shes been seen since, she was seen in 10/22 for a follow up and not CPE because it would have been to soon. She has seen other providers since but they were acute visits. She said she will check her records and call us back

## 2022-06-12 NOTE — Telephone Encounter (Signed)
Patient called back and said that she did have a CPE on 09-09-2021 and said that we just coded it wrong (This date probably would have been to soon to have CPE since most insurances require a year and a day and her last was 10/03/20). I told patient that Dr Einar Pheasant sent in a 30d/s and that she was requiring a Diabetes follow up before she would refill again and the patient said that she will schedule out in October for her CPE and not any sooner. I did let her know that Dr Einar Pheasant would probably not fill if shes not seen sooner and she said "If Dr Einar Pheasant wants to withhold my medication than that's fine, we will just see what happens when I'm not on my medication". I did schedule patient for her next CPE and she said it would need to be in October so her insurance would cover.

## 2022-06-12 NOTE — Telephone Encounter (Signed)
LVM for patient to return call. 

## 2022-06-12 NOTE — Telephone Encounter (Signed)
Patient was last seen on August 31, 2021 at that time she was told to follow-up in 4 months because her diabetes was not well controlled.  She needs repeat labs.  If she prefers to have the visit be an annual exam and she has no concerns she can move her annual exam up and we can address her diabetes and refill her medication during that visit.  She cannot wait until October for this she needs repeat labs done to assess whether or not she needs dose adjustments in her medication.  If she has a lot going on the summer I am happy to provide a 30-day supply until she can schedule an office visit

## 2022-06-13 NOTE — Telephone Encounter (Signed)
Appreciate nursing support.   Will approve any transfer of care if patient decides to change.   I do recommend that she have her labs assess soon as it was not at goal during our last visit.

## 2022-06-27 ENCOUNTER — Other Ambulatory Visit: Payer: Self-pay | Admitting: Family Medicine

## 2022-07-04 ENCOUNTER — Other Ambulatory Visit: Payer: Self-pay | Admitting: Family Medicine

## 2022-07-17 ENCOUNTER — Encounter: Payer: BC Managed Care – PPO | Admitting: Family Medicine

## 2022-07-24 ENCOUNTER — Other Ambulatory Visit: Payer: Self-pay | Admitting: Family Medicine

## 2022-07-27 ENCOUNTER — Encounter: Payer: Self-pay | Admitting: Nurse Practitioner

## 2022-07-27 ENCOUNTER — Ambulatory Visit: Payer: BC Managed Care – PPO | Admitting: Nurse Practitioner

## 2022-07-27 VITALS — BP 120/78 | HR 70 | Temp 97.7°F | Ht 68.5 in | Wt 139.4 lb

## 2022-07-27 DIAGNOSIS — H53452 Other localized visual field defect, left eye: Secondary | ICD-10-CM | POA: Diagnosis not present

## 2022-07-27 DIAGNOSIS — E1169 Type 2 diabetes mellitus with other specified complication: Secondary | ICD-10-CM | POA: Diagnosis not present

## 2022-07-27 DIAGNOSIS — I10 Essential (primary) hypertension: Secondary | ICD-10-CM

## 2022-07-27 DIAGNOSIS — R0789 Other chest pain: Secondary | ICD-10-CM

## 2022-07-27 DIAGNOSIS — I252 Old myocardial infarction: Secondary | ICD-10-CM

## 2022-07-27 DIAGNOSIS — E785 Hyperlipidemia, unspecified: Secondary | ICD-10-CM | POA: Diagnosis not present

## 2022-07-27 DIAGNOSIS — R5383 Other fatigue: Secondary | ICD-10-CM | POA: Diagnosis not present

## 2022-07-27 LAB — POCT GLYCOSYLATED HEMOGLOBIN (HGB A1C): Hemoglobin A1C: 6.9 % — AB (ref 4.0–5.6)

## 2022-07-27 NOTE — Assessment & Plan Note (Signed)
Patient currently managed by [name], cardiology.  Patient maintained on metoprolol, losartan, clopidogrel.  She is status post stent placement

## 2022-07-27 NOTE — Assessment & Plan Note (Signed)
Has partial vision loss has been evaluated by specialist in the past.  States it has improved some as of late.

## 2022-07-27 NOTE — Assessment & Plan Note (Signed)
Continue losartan and metoprolol.  Continue following up with cardiologist as recommended

## 2022-07-27 NOTE — Assessment & Plan Note (Signed)
Patient has had a lot of stressors over the past several years with losing of her husband and recently losing of her pet.  States she is trying to get into a rhythm of a normal sleep schedule.  We can consider checking vitamin D and vitamin B12 at her physical along with other CPE labs.  Can always consider administering an Epworth Sleepiness Scale to rule out sleep apnea, which is of lower likelihood

## 2022-07-27 NOTE — Assessment & Plan Note (Signed)
A1c in office 6.9%.  Continue taking glipizide and metformin as prescribed.

## 2022-07-27 NOTE — Patient Instructions (Addendum)
Nice to see you today A1C was 6.9 in office Follow up after 09/01/2022 for your next physical

## 2022-07-27 NOTE — Progress Notes (Signed)
Established Patient Office Visit  Subjective   Patient ID: Melissa Lamb, female    DOB: 07-30-1961  Age: 61 y.o. MRN: [id number]  Chief Complaint  Patient presents with   Establish Care    HPI  HTN: Dr Clayborn Bigness.  She is currently maintained on losartan and metoprolol.  DM2: checks her glucose daily. Fasting 145 this. No recent trouble of hypoglycemia. States if she feels low she will.  Thyroid: Patient is status post total thyroidectomy due to thyroid cancer.  She is currently followed by endocrinology and maintained on levothyroxine.  Optic neuropathy: Checks yearly with eye doctor. States that the left lower quad vision loss on the left side. Has been evaluated by eye specialist in the past.  Hx of MI: States that she is followed by [name].  Patient had a stent and currently maintained on clopidogrel    Fatigue: to the point where she is on the couch. States that over the past 3 years she has not had the best sleeping paterns. As of late she has been using eye maske and musics. States 6-7 hours of sleep.States that she does feel restored when she sleeps.  States that she started taking a 50+ multivitamin with some modest improvement.  Chest pain: left sided that is an ache sensation. Not having it currently. Interment. States does not feel like a heart.  Not releated to activity. States that position makes a difference. States that it has not happened in a couple weeks. Hurts with palpation      07/27/2022    8:33 AM 10/03/2020   10:17 AM 04/11/2020    2:48 PM  PHQ9 SCORE ONLY  PHQ-9 Total Score 3 0 2        Review of Systems  Constitutional:  Positive for malaise/fatigue. Negative for chills and fever.  Respiratory:  Positive for shortness of breath.   Cardiovascular:  Positive for chest pain.  Psychiatric/Behavioral:  Negative for hallucinations and suicidal ideas.       Objective:     BP 120/78   Pulse 70   Temp 97.7 F (36.5 C) (Temporal)   Ht 5'  8.5" (1.74 m)   Wt 139 lb 6.4 oz (63.2 kg)   SpO2 97%   BMI 20.89 kg/m  BP Readings from Last 3 Encounters:  07/27/22 120/78  03/02/22 114/60  02/19/22 122/80   Wt Readings from Last 3 Encounters:  07/27/22 139 lb 6.4 oz (63.2 kg)  03/02/22 140 lb 7 oz (63.7 kg)  02/19/22 139 lb (63 kg)      Physical Exam Vitals and nursing note reviewed.  Constitutional:      Appearance: Normal appearance.  HENT:     Right Ear: Tympanic membrane, ear canal and external ear normal.     Left Ear: Tympanic membrane, ear canal and external ear normal.     Mouth/Throat:     Mouth: Mucous membranes are moist.     Pharynx: Oropharynx is clear.  Eyes:     Extraocular Movements: Extraocular movements intact.     Pupils: Pupils are equal, round, and reactive to light.     Comments: Wears glasses  Cardiovascular:     Rate and Rhythm: Normal rate and regular rhythm.     Pulses: Normal pulses.     Heart sounds: Normal heart sounds.  Pulmonary:     Effort: Pulmonary effort is normal.     Breath sounds: Normal breath sounds.  Musculoskeletal:     Right lower leg: No  edema.     Left lower leg: No edema.  Lymphadenopathy:     Cervical: No cervical adenopathy.  Skin:    General: Skin is warm.  Neurological:     General: No focal deficit present.     Mental Status: She is alert.     Deep Tendon Reflexes:     Reflex Scores:      Bicep reflexes are 2+ on the right side and 2+ on the left side.      Patellar reflexes are 2+ on the right side and 2+ on the left side.    Comments: Bilateral upper and lower extremity strength 5/5      Results for orders placed or performed in visit on 07/27/22  POCT glycosylated hemoglobin (Hb A1C)  Result Value Ref Range   Hemoglobin A1C 6.9 (A) 4.0 - 5.6 %   HbA1c POC (<> result, manual entry)     HbA1c, POC (prediabetic range)     HbA1c, POC (controlled diabetic range)        The ASCVD Risk score (Arnett DK, et al., 2019) failed to calculate for the  following reasons:   The patient has a prior MI or stroke diagnosis    Assessment & Plan:   Problem List Items Addressed This Visit       Cardiovascular and Mediastinum   Essential hypertension    Continue losartan and metoprolol.  Continue following up with cardiologist as recommended        Endocrine   Type 2 diabetes mellitus with hyperlipidemia ([place]) - Primary    A1c in office 6.9%.  Continue taking glipizide and metformin as prescribed.      Relevant Orders   POCT glycosylated hemoglobin (Hb A1C) (Completed)     Other   Abnormal peripheral vision of left eye    Has partial vision loss has been evaluated by specialist in the past.  States it has improved some as of late.      History of ST elevation myocardial infarction (STEMI)    Patient currently managed by [name], cardiology.  Patient maintained on metoprolol, losartan, clopidogrel.  She is status post stent placement      Other fatigue    Patient has had a lot of stressors over the past several years with losing of her husband and recently losing of her pet.  States she is trying to get into a rhythm of a normal sleep schedule.  We can consider checking vitamin D and vitamin B12 at her physical along with other CPE labs.  Can always consider administering an Epworth Sleepiness Scale to rule out sleep apnea, which is of lower likelihood      Atypical chest pain    Per patient report recently had work-up with cardiologist that came back normal.  It is reproducible with palpation.       Return in about 6 weeks (around 09/07/2022) for Cpe and labs.    [name], NP

## 2022-07-27 NOTE — Assessment & Plan Note (Signed)
Per patient report recently had work-up with cardiologist that came back normal.  It is reproducible with palpation.

## 2022-08-01 ENCOUNTER — Other Ambulatory Visit: Payer: Self-pay | Admitting: Family Medicine

## 2022-08-18 ENCOUNTER — Other Ambulatory Visit: Payer: Self-pay | Admitting: Family Medicine

## 2022-09-03 ENCOUNTER — Encounter: Payer: BC Managed Care – PPO | Admitting: Family Medicine

## 2022-10-01 ENCOUNTER — Encounter: Payer: BC Managed Care – PPO | Admitting: Nurse Practitioner

## 2022-10-16 ENCOUNTER — Ambulatory Visit (INDEPENDENT_AMBULATORY_CARE_PROVIDER_SITE_OTHER)
Admission: RE | Admit: 2022-10-16 | Discharge: 2022-10-16 | Disposition: A | Discharge status: Home / Self Care | Payer: BC Managed Care – PPO | Source: Ambulatory Visit | Attending: Nurse Practitioner | Admitting: Nurse Practitioner

## 2022-10-16 ENCOUNTER — Ambulatory Visit: Payer: BC Managed Care – PPO | Admitting: Nurse Practitioner

## 2022-10-16 ENCOUNTER — Encounter: Payer: Self-pay | Admitting: Nurse Practitioner

## 2022-10-16 VITALS — BP 130/78 | HR 71 | Temp 99.0°F | Resp 10 | Ht 68.5 in | Wt 143.1 lb

## 2022-10-16 DIAGNOSIS — R052 Subacute cough: Secondary | ICD-10-CM | POA: Diagnosis not present

## 2022-10-16 DIAGNOSIS — R059 Cough, unspecified: Secondary | ICD-10-CM | POA: Diagnosis not present

## 2022-10-16 MED ORDER — AZITHROMYCIN 250 MG PO TABS: ORAL_TABLET | ORAL | 0 refills | Status: AC

## 2022-10-16 NOTE — Assessment & Plan Note (Signed)
Had exposure from her son who works in the school system.  Will obtain chest x-ray, pending result.  Treat patient with azithromycin 250 mg pack as directed.  Follow-up if no improvement She can use fluid and over-the-counter candies and/or cough drops to help aid in cough reduction

## 2022-10-16 NOTE — Patient Instructions (Signed)
Nice to see you today I will be in touch with the xray once I have the results Follow up if no improvement

## 2022-10-16 NOTE — Progress Notes (Signed)
   Acute Office Visit  Subjective:     Patient ID: Melissa Lamb, female    DOB: 1960-12-09, 61 y.o.   MRN: [id number]  Chief Complaint  Patient presents with   Cough    Since end of October or beginning of November possibly. Deep cough now, more of a dry cough with slight productivity but does not come up. Some nasal congestion. No fever.      Patient is in today for cough  States started approx 1 month ago States that she was exposed to her son that has had a cough States that she feels like she is doing better and then had a horrible weekend No covid test Covid vaccine Flu vaccine up to date Has been using some cough syrup.  Review of Systems  Constitutional:  Positive for malaise/fatigue. Negative for chills and fever.  HENT:  Negative for ear discharge, ear pain, sinus pain and sore throat.   Respiratory:  Positive for cough. Negative for shortness of breath.   Neurological:  Negative for headaches.        Objective:    BP 130/78   Pulse 71   Temp 99 F (37.2 C) (Oral)   Resp 10   Ht 5' 8.5" (1.74 m)   Wt 143 lb 2 oz (64.9 kg)   SpO2 98%   BMI 21.45 kg/m    Physical Exam Vitals and nursing note reviewed.  Constitutional:      Appearance: Normal appearance.  HENT:     Right Ear: Tympanic membrane, ear canal and external ear normal.     Left Ear: Ear canal and external ear normal.     Ears:     Comments: Fluid behind left TM    Mouth/Throat:     Mouth: Mucous membranes are moist.     Pharynx: Oropharynx is clear.  Cardiovascular:     Rate and Rhythm: Normal rate and regular rhythm.     Heart sounds: Normal heart sounds.  Pulmonary:     Effort: Pulmonary effort is normal.     Breath sounds: Normal breath sounds.  Lymphadenopathy:     Cervical: No cervical adenopathy.  Neurological:     Mental Status: She is alert.     No results found for any visits on 10/16/22.      Assessment & Plan:   Problem List Items Addressed This Visit        Other   Subacute cough - Primary    Had exposure from her son who works in the school system.  Will obtain chest x-ray, pending result.  Treat patient with azithromycin 250 mg pack as directed.  Follow-up if no improvement She can use fluid and over-the-counter candies and/or cough drops to help aid in cough reduction      Relevant Medications   azithromycin (ZITHROMAX) 250 MG tablet   Other Relevant Orders   DG Chest 2 View    Meds ordered this encounter  Medications   azithromycin (ZITHROMAX) 250 MG tablet    Sig: Take 2 tablets on day 1, then 1 tablet daily on days 2 through 5    Dispense:  6 tablet    Refill:  0    Order Specific Question:   Supervising Provider    Answer:   Loura Pardon A [[id number]    Return if symptoms worsen or fail to improve, for As scheduled for CPE.  [name], NP

## 2022-10-17 ENCOUNTER — Other Ambulatory Visit: Payer: Self-pay

## 2022-10-18 MED ORDER — GLIPIZIDE 5 MG PO TABS: 7.5000 mg | ORAL_TABLET | Freq: Every day | ORAL | 0 refills | Status: DC

## 2022-10-25 ENCOUNTER — Encounter: Payer: Self-pay | Admitting: Nurse Practitioner

## 2022-10-25 DIAGNOSIS — R052 Subacute cough: Secondary | ICD-10-CM

## 2022-10-26 MED ORDER — LEVOCETIRIZINE DIHYDROCHLORIDE 5 MG PO TABS: 5.0000 mg | ORAL_TABLET | Freq: Every evening | ORAL | 0 refills | Status: DC

## 2022-10-26 MED ORDER — FLUTICASONE PROPIONATE 50 MCG/ACT NA SUSP: 2.0000 | Freq: Every day | NASAL | 0 refills | Status: DC

## 2022-10-30 ENCOUNTER — Other Ambulatory Visit: Payer: Self-pay | Admitting: Nurse Practitioner

## 2022-10-30 DIAGNOSIS — R052 Subacute cough: Secondary | ICD-10-CM

## 2022-11-02 ENCOUNTER — Telehealth: Payer: Self-pay

## 2022-11-02 MED ORDER — METFORMIN HCL 500 MG PO TABS: 500.0000 mg | ORAL_TABLET | Freq: Two times a day (BID) | ORAL | 1 refills | Status: DC

## 2022-11-02 NOTE — Telephone Encounter (Signed)
Refill provided

## 2022-11-02 NOTE — Telephone Encounter (Signed)
Requesting refill on

## 2022-11-02 NOTE — Addendum Note (Signed)
Addended by: Michela Pitcher on: 11/02/2022 12:59 PM   Modules accepted: Orders

## 2022-11-06 ENCOUNTER — Ambulatory Visit (INDEPENDENT_AMBULATORY_CARE_PROVIDER_SITE_OTHER): Payer: BC Managed Care – PPO | Admitting: Nurse Practitioner

## 2022-11-06 ENCOUNTER — Other Ambulatory Visit: Payer: Self-pay | Admitting: Nurse Practitioner

## 2022-11-06 ENCOUNTER — Encounter: Payer: Self-pay | Admitting: Nurse Practitioner

## 2022-11-06 VITALS — BP 114/66 | HR 75 | Temp 97.6°F | Resp 16 | Ht 67.5 in | Wt 140.2 lb

## 2022-11-06 DIAGNOSIS — R5383 Other fatigue: Secondary | ICD-10-CM | POA: Diagnosis not present

## 2022-11-06 DIAGNOSIS — Z1231 Encounter for screening mammogram for malignant neoplasm of breast: Secondary | ICD-10-CM

## 2022-11-06 DIAGNOSIS — I252 Old myocardial infarction: Secondary | ICD-10-CM

## 2022-11-06 DIAGNOSIS — I7300 Raynaud's syndrome without gangrene: Secondary | ICD-10-CM

## 2022-11-06 DIAGNOSIS — E1169 Type 2 diabetes mellitus with other specified complication: Secondary | ICD-10-CM

## 2022-11-06 DIAGNOSIS — Z0000 Encounter for general adult medical examination without abnormal findings: Secondary | ICD-10-CM | POA: Diagnosis not present

## 2022-11-06 DIAGNOSIS — I10 Essential (primary) hypertension: Secondary | ICD-10-CM | POA: Diagnosis not present

## 2022-11-06 DIAGNOSIS — E890 Postprocedural hypothyroidism: Secondary | ICD-10-CM

## 2022-11-06 LAB — COMPREHENSIVE METABOLIC PANEL
ALT: 16 U/L (ref 0–35)
AST: 26 U/L (ref 0–37)
Albumin: 5.2 g/dL (ref 3.5–5.2)
Alkaline Phosphatase: 86 U/L (ref 39–117)
BUN: 15 mg/dL (ref 6–23)
CO2: 29 mEq/L (ref 19–32)
Calcium: 9.8 mg/dL (ref 8.4–10.5)
Chloride: 99 mEq/L (ref 96–112)
Creatinine, Ser: 0.95 mg/dL (ref 0.40–1.20)
GFR: 64.26 mL/min (ref >60.00)
Glucose, Bld: 124 mg/dL — ABNORMAL HIGH (ref 70–99)
Potassium: 3.9 mEq/L (ref 3.5–5.1)
Sodium: 138 mEq/L (ref 135–145)
Total Bilirubin: 2.4 mg/dL — ABNORMAL HIGH (ref 0.2–1.2)
Total Protein: 6.8 g/dL (ref 6.0–8.3)

## 2022-11-06 LAB — LDL CHOLESTEROL, DIRECT: Direct LDL: 87 mg/dL

## 2022-11-06 LAB — CBC
HCT: 43.8 % (ref 36.0–46.0)
Hemoglobin: 14.7 g/dL (ref 12.0–15.0)
MCHC: 33.5 g/dL (ref 30.0–36.0)
MCV: 90.6 fl (ref 78.0–100.0)
Platelets: 222 10*3/uL (ref 150.0–400.0)
RBC: 4.83 Mil/uL (ref 3.87–5.11)
RDW: 12.9 % (ref 11.5–15.5)
WBC: 6.4 10*3/uL (ref 4.0–10.5)

## 2022-11-06 LAB — LIPID PANEL
Cholesterol: 161 mg/dL (ref 0–200)
HDL: 36.3 mg/dL — ABNORMAL LOW (ref >39.00)
NonHDL: 124.66
Total CHOL/HDL Ratio: 4
Triglycerides: 253 mg/dL — ABNORMAL HIGH (ref 0.0–149.0)
VLDL: 50.6 mg/dL — ABNORMAL HIGH (ref 0.0–40.0)

## 2022-11-06 LAB — VITAMIN B12: Vitamin B-12: 1100 pg/mL — ABNORMAL HIGH (ref 211–911)

## 2022-11-06 LAB — HEMOGLOBIN A1C: Hgb A1c MFr Bld: 7.7 % — ABNORMAL HIGH (ref 4.6–6.5)

## 2022-11-06 LAB — VITAMIN D 25 HYDROXY (VIT D DEFICIENCY, FRACTURES): VITD: 71.15 ng/mL (ref 30.00–100.00)

## 2022-11-06 NOTE — Progress Notes (Signed)
Established Patient Office Visit  Subjective   Patient ID: Melissa Lamb, female    DOB: 30-Dec-1960  Age: 61 y.o. MRN: [id number]  Chief Complaint  Patient presents with   Annual Exam    HPI  for complete physical and follow up of chronic conditions.   HTN: Dr Clayborn Bigness.  Patient currently maintained on losartan and metoprolol.  DM2: checks glucose daily. 158 this morning. Currently glipizide and metformin   Thyroid: post total thyroidectomy. Melissa Lamb appt next week   Opic Neuropathy: yearly eye doctor visit  Immunizations: -Tetanus:2023 -Influenza:09/10/2022 -Shingles: information given -Pneumonia: Too young  -HPV: aged out  Diet: [place]. States that she is trying to eat 3 meals. States that she has been bad. States that she has been feeling tired. States avoid snacks. Ilk, water, twist diet fruit drink, sometime soda Exercise: . States that she is doing ellpitical 20-25 mins   Eye exam: Completes annually . Wears glasses. needs Dental exam: Completes semi-annually   Pap Smear: Patient had a hysterectomy.  Ovaries intact Mammogram: Completed in 11/23/2021 will be due next month  Colonoscopy: Completed in 2021, repeat in 5 years 2026 Lung Cancer Screening: N/A Dexa: Too young   Sleep: States that she does nap during the day due to the fatigue, She will go to bed around 1130- 6am. States sometimes she feels rested.      Review of Systems  Constitutional:  Positive for malaise/fatigue. Negative for chills and fever.  Respiratory:  Positive for cough. Negative for shortness of breath.   Cardiovascular:  Negative for chest pain.  Gastrointestinal:  Negative for abdominal pain, diarrhea, nausea and vomiting.      Objective:     BP 114/66   Pulse 75   Temp 97.6 F (36.4 C)   Resp 16   Ht 5' 7.5" (1.715 m)   Wt 140 lb 4 oz (63.6 kg)   SpO2 99%   BMI 21.64 kg/m  BP Readings from Last 3 Encounters:  11/06/22 114/66  10/16/22 130/78   07/27/22 120/78   Wt Readings from Last 3 Encounters:  11/06/22 140 lb 4 oz (63.6 kg)  10/16/22 143 lb 2 oz (64.9 kg)  07/27/22 139 lb 6.4 oz (63.2 kg)      Physical Exam Vitals and nursing note reviewed.  Constitutional:      Appearance: Normal appearance.  HENT:     Right Ear: Tympanic membrane, ear canal and external ear normal.     Left Ear: Tympanic membrane, ear canal and external ear normal.     Mouth/Throat:     Mouth: Mucous membranes are moist.     Pharynx: Oropharynx is clear.  Eyes:     Extraocular Movements: Extraocular movements intact.     Pupils: Pupils are equal, round, and reactive to light.     Comments: Wears glasses  Cardiovascular:     Rate and Rhythm: Normal rate and regular rhythm.     Heart sounds: Normal heart sounds.  Pulmonary:     Effort: Pulmonary effort is normal.     Breath sounds: Normal breath sounds.  Abdominal:     General: Bowel sounds are normal. There is no distension.     Palpations: There is no mass.     Tenderness: There is no abdominal tenderness.     Hernia: No hernia is present.  Musculoskeletal:     Right lower leg: No edema.     Left lower leg: No edema.  Lymphadenopathy:  Cervical: No cervical adenopathy.  Skin:    General: Skin is warm.  Neurological:     General: No focal deficit present.     Mental Status: She is alert.     Deep Tendon Reflexes:     Reflex Scores:      Bicep reflexes are 2+ on the right side and 2+ on the left side.      Patellar reflexes are 2+ on the right side and 2+ on the left side.    Comments: Bilateral upper and lower extremity strength 5/5      No results found for any visits on 11/06/22.    The ASCVD Risk score (Arnett DK, et al., 2019) failed to calculate for the following reasons:   The patient has a prior MI or stroke diagnosis    Assessment & Plan:   Problem List Items Addressed This Visit       Cardiovascular and Mediastinum   Essential hypertension    Patient  currently maintained on losartan metoprolol.  She is followed by [name] cardiologist through [place].  Continue taking medication as prescribed follow-up with cardiologist as recommended        Endocrine   Type 2 diabetes mellitus with other specified complication [facility])    Patient currently maintained on glipizide and metformin.  She does check her sugars at home.  Pending A1c today continue taking medication as prescribed      Relevant Orders   Hemoglobin A1c   Lipid panel   Post-surgical hypothyroidism    Patient is followed by Dr. Mee Hives        Other   Preventative health care - Primary    Discussed age-appropriate immunizations and screening exams.  Patient was given information at discharge about preventative healthcare maintenance and anticipatory guidance for age range.  Patient no longer requires Pap smears, up-to-date on mammogram, up-to-date on colonoscopy.      Relevant Orders   CBC   Comprehensive metabolic panel   Lipid panel   History of ST elevation myocardial infarction (STEMI)    Patient is followed by [name] cardiology at [facility] clinic.  Continue following with him patient is on clopidogrel 75 mg daily continue medication as prescribed.      Raynaud's phenomenon without gangrene    Patient is concerned with Raynaud's phenomenon.  She is followed by endocrinology.  No evidence of gangrene.      Other fatigue    Patient is been taking some B12 vitamins we will check her B12 level today along with vitamin D.  Pending lab results      Relevant Orders   Vitamin B12   VITAMIN D 25 Hydroxy (Vit-D Deficiency, Fractures)    Return in about 4 months (around 03/08/2023) for DM follow up .    [name], NP

## 2022-11-06 NOTE — Patient Instructions (Signed)
Nice to see you today I will be in touch with the labs I will see you in 4 month, pending the A1C

## 2022-11-06 NOTE — Assessment & Plan Note (Signed)
Patient is followed by Dr. Mee Hives

## 2022-11-06 NOTE — Assessment & Plan Note (Signed)
Patient is concerned with Raynaud's phenomenon.  She is followed by endocrinology.  No evidence of gangrene.

## 2022-11-06 NOTE — Assessment & Plan Note (Signed)
Patient is been taking some B12 vitamins we will check her B12 level today along with vitamin D.  Pending lab results

## 2022-11-06 NOTE — Assessment & Plan Note (Signed)
Patient currently maintained on losartan metoprolol.  She is followed by [name] cardiologist through [place].  Continue taking medication as prescribed follow-up with cardiologist as recommended

## 2022-11-06 NOTE — Assessment & Plan Note (Signed)
Patient is followed by [name] cardiology at [facility] clinic.  Continue following with him patient is on clopidogrel 75 mg daily continue medication as prescribed.

## 2022-11-06 NOTE — Assessment & Plan Note (Signed)
Discussed age-appropriate immunizations and screening exams.  Patient was given information at discharge about preventative healthcare maintenance and anticipatory guidance for age range.  Patient no longer requires Pap smears, up-to-date on mammogram, up-to-date on colonoscopy.

## 2022-11-06 NOTE — Assessment & Plan Note (Signed)
Patient currently maintained on glipizide and metformin.  She does check her sugars at home.  Pending A1c today continue taking medication as prescribed

## 2022-11-07 ENCOUNTER — Telehealth: Payer: Self-pay | Admitting: Nurse Practitioner

## 2022-11-07 NOTE — Telephone Encounter (Signed)
Spoke to pt about results.

## 2022-11-07 NOTE — Telephone Encounter (Signed)
Patient returned call to [facility] regarding her lab results. Would like a call back

## 2022-11-08 DIAGNOSIS — E890 Postprocedural hypothyroidism: Secondary | ICD-10-CM | POA: Diagnosis not present

## 2022-11-09 DIAGNOSIS — C73 Malignant neoplasm of thyroid gland: Secondary | ICD-10-CM | POA: Diagnosis not present

## 2022-11-15 DIAGNOSIS — E890 Postprocedural hypothyroidism: Secondary | ICD-10-CM | POA: Diagnosis not present

## 2022-11-15 DIAGNOSIS — C73 Malignant neoplasm of thyroid gland: Secondary | ICD-10-CM | POA: Diagnosis not present

## 2022-11-20 ENCOUNTER — Other Ambulatory Visit: Payer: Self-pay | Admitting: Nurse Practitioner

## 2022-11-20 DIAGNOSIS — R052 Subacute cough: Secondary | ICD-10-CM

## 2022-12-04 ENCOUNTER — Ambulatory Visit
Admission: RE | Admit: 2022-12-04 | Discharge: 2022-12-04 | Disposition: A | Discharge status: Home / Self Care | Payer: BC Managed Care – PPO | Source: Ambulatory Visit | Attending: Nurse Practitioner | Admitting: Nurse Practitioner

## 2022-12-04 DIAGNOSIS — Z1231 Encounter for screening mammogram for malignant neoplasm of breast: Secondary | ICD-10-CM | POA: Insufficient documentation

## 2023-01-18 ENCOUNTER — Other Ambulatory Visit: Payer: Self-pay | Admitting: Nurse Practitioner

## 2023-03-07 DIAGNOSIS — C73 Malignant neoplasm of thyroid gland: Secondary | ICD-10-CM | POA: Diagnosis not present

## 2023-03-08 ENCOUNTER — Ambulatory Visit: Payer: BC Managed Care – PPO | Admitting: Nurse Practitioner

## 2023-03-20 DIAGNOSIS — E890 Postprocedural hypothyroidism: Secondary | ICD-10-CM | POA: Diagnosis not present

## 2023-03-20 DIAGNOSIS — C73 Malignant neoplasm of thyroid gland: Secondary | ICD-10-CM | POA: Diagnosis not present

## 2023-03-26 ENCOUNTER — Encounter: Payer: Self-pay | Admitting: Nurse Practitioner

## 2023-03-26 ENCOUNTER — Ambulatory Visit: Payer: BC Managed Care – PPO | Admitting: Nurse Practitioner

## 2023-03-26 VITALS — BP 118/66 | HR 75 | Temp 98.3°F | Resp 16 | Ht 67.5 in | Wt 138.5 lb

## 2023-03-26 DIAGNOSIS — Z7984 Long term (current) use of oral hypoglycemic drugs: Secondary | ICD-10-CM

## 2023-03-26 DIAGNOSIS — E1169 Type 2 diabetes mellitus with other specified complication: Secondary | ICD-10-CM

## 2023-03-26 DIAGNOSIS — N8110 Cystocele, unspecified: Secondary | ICD-10-CM

## 2023-03-26 DIAGNOSIS — I10 Essential (primary) hypertension: Secondary | ICD-10-CM | POA: Diagnosis not present

## 2023-03-26 DIAGNOSIS — M5136 Other intervertebral disc degeneration, lumbar region: Secondary | ICD-10-CM

## 2023-03-26 DIAGNOSIS — R053 Chronic cough: Secondary | ICD-10-CM

## 2023-03-26 LAB — POCT GLYCOSYLATED HEMOGLOBIN (HGB A1C): Hemoglobin A1C: 7 % — AB (ref 4.0–5.6)

## 2023-03-26 NOTE — Assessment & Plan Note (Signed)
Patient states she still has the cough from when she was evaluated towards the end of last year.  She has tried antihistamines along with steroid nasal sprays.  We have done a antibiotic in the past.  Told her neck step would be a reflux medication such as a PPI or H2 blocker.  We can always refer patient to ENT or pulmonology if need be.

## 2023-03-26 NOTE — Assessment & Plan Note (Signed)
Per patient report bladder prolapse.  She would like to see a specialist.  Told her we would need to see a urogynecologist she will look up providers in the area and reach out to me to let me know which when she would like to see and then I will place that referral

## 2023-03-26 NOTE — Progress Notes (Signed)
Established Patient Office Visit  Subjective   Patient ID: Melissa Lamb, female    DOB: [birth year]  Age: 62 y.o. MRN: [id number]  Chief Complaint  Patient presents with   Diabetes      DM2: Patient currently maintained on glipizide 5 mg (patient takes 1-1/2 tablets daily) and metformin 500 mg twice a day. States that she was doing different techniques. Therories and food combining. States that she is prioritizing her protiens and veggies. States that her exercise should be increasing with the weather improving. States that she will check her glucose in the am fasting. States that she was 167 this am. States that she does not thnk that she has had low sugars. Hard to tell per her report  HTN: Patient currently maintained on losartan 25 mg and metoprolol 25 mg.  She is followed by Dr. Juliann Pares at [facility] cardiology.  Thyroid: recently seen by Dr. Roberts Gaudy and has [place] on 03/07/2023. Repeat in 1 year [place].  Patient is concerned that he does not want repeat imaging sooner.  Cough: states that she still has the cough. States it feels like she is having something in the back of the throat. States that she has tried antihistamine and flonase. We also did a antibiotic.  Patient denies any symptoms of reflux.  Prolapsed bladder: states that she has a hx of bladder prolapse and states that her baldder will get dry and chaffe.  She is curious if there is a cream that can be used to prevent the bladder from getting dry and bleeding.  DDD lumbar spine. States that she has noticed it over the past month or 2. States that it started on the left and went to the right side.  Patient is having discomfort/pain.    Review of Systems  Constitutional:  Negative for chills and fever.  Respiratory:  Negative for shortness of breath.   Cardiovascular:  Negative for chest pain.  Neurological:  Negative for headaches.  Psychiatric/Behavioral:  Negative for hallucinations and suicidal ideas.        Objective:     BP 118/66   Pulse 75   Temp 98.3 F (36.8 C)   Resp 16   Ht 5' 7.5" (1.715 m)   Wt 138 lb 8 oz (62.8 kg)   SpO2 99%   BMI 21.37 kg/m  BP Readings from Last 3 Encounters:  03/26/23 118/66  11/06/22 114/66  10/16/22 130/78   Wt Readings from Last 3 Encounters:  03/26/23 138 lb 8 oz (62.8 kg)  11/06/22 140 lb 4 oz (63.6 kg)  10/16/22 143 lb 2 oz (64.9 kg)      Physical Exam Vitals and nursing note reviewed.  Constitutional:      Appearance: Normal appearance.  Cardiovascular:     Rate and Rhythm: Normal rate and regular rhythm.     Heart sounds: Normal heart sounds.  Pulmonary:     Effort: Pulmonary effort is normal.     Breath sounds: Normal breath sounds.  Musculoskeletal:        General: Tenderness present.       Back:  Neurological:     Mental Status: She is alert.      Results for orders placed or performed in visit on 03/26/23  POCT glycosylated hemoglobin (Hb A1C)  Result Value Ref Range   Hemoglobin A1C 7.0 (A) 4.0 - 5.6 %   HbA1c POC (<> result, manual entry)     HbA1c, POC (prediabetic range)     HbA1c, POC (  controlled diabetic range)        The ASCVD Risk score (Arnett DK, et al., 2019) failed to calculate for the following reasons:   The patient has a prior MI or stroke diagnosis    Assessment & Plan:   Problem List Items Addressed This Visit       Cardiovascular and Mediastinum   Essential hypertension    Patient currently maintained on losartan 25 mg of metoprolol 25 mg.  Blood pressure under good control patient is followed by cardiology.  Continue medication as prescribed      Relevant Medications   rosuvastatin (CRESTOR) 40 MG tablet     Endocrine   Type 2 diabetes mellitus with other specified complication (HCC) - Primary    Patient's A1c trended down from 7.7% to 7.0.  Continue glipizide 7.5 mg daily along with metformin 500 mg twice daily.  Will leave medication the same patient to continue working on  prioritizing her eating patterns and lifestyle modifications.      Relevant Medications   rosuvastatin (CRESTOR) 40 MG tablet   Other Relevant Orders   POCT glycosylated hemoglobin (Hb A1C) (Completed)     Musculoskeletal and Integument   DDD (degenerative disc disease), lumbar    Noted to have degenerative changes in the spine on a chest x-ray.  Patient states having some discomfort in the spine we did talk about exercising to keep the muscle strong and over-the-counter analgesics such as Tylenol as needed.        Genitourinary   Bladder prolapse, female, acquired    Per patient report bladder prolapse.  She would like to see a specialist.  Told her we would need to see a urogynecologist she will look up providers in the area and reach out to me to let me know which when she would like to see and then I will place that referral        Other   Chronic cough    Patient states she still has the cough from when she was evaluated towards the end of last year.  She has tried antihistamines along with steroid nasal sprays.  We have done a antibiotic in the past.  Told her neck step would be a reflux medication such as a PPI or H2 blocker.  We can always refer patient to ENT or pulmonology if need be.       Return in about 4 months (around 07/27/2023) for DM recheck.    Audria Nine, NP

## 2023-03-26 NOTE — Assessment & Plan Note (Signed)
Patient's A1c trended down from 7.7% to 7.0.  Continue glipizide 7.5 mg daily along with metformin 500 mg twice daily.  Will leave medication the same patient to continue working on prioritizing her eating patterns and lifestyle modifications.

## 2023-03-26 NOTE — Assessment & Plan Note (Signed)
Noted to have degenerative changes in the spine on a chest x-ray.  Patient states having some discomfort in the spine we did talk about exercising to keep the muscle strong and over-the-counter analgesics such as Tylenol as needed.

## 2023-03-26 NOTE — Assessment & Plan Note (Signed)
Patient currently maintained on losartan 25 mg of metoprolol 25 mg.  Blood pressure under good control patient is followed by cardiology.  Continue medication as prescribed

## 2023-04-17 ENCOUNTER — Other Ambulatory Visit: Payer: Self-pay | Admitting: Nurse Practitioner

## 2023-04-22 ENCOUNTER — Other Ambulatory Visit: Payer: Self-pay | Admitting: Nurse Practitioner

## 2023-05-09 DIAGNOSIS — I252 Old myocardial infarction: Secondary | ICD-10-CM | POA: Diagnosis not present

## 2023-05-09 DIAGNOSIS — I2510 Atherosclerotic heart disease of native coronary artery without angina pectoris: Secondary | ICD-10-CM | POA: Diagnosis not present

## 2023-05-09 DIAGNOSIS — Z955 Presence of coronary angioplasty implant and graft: Secondary | ICD-10-CM | POA: Diagnosis not present

## 2023-05-09 DIAGNOSIS — I10 Essential (primary) hypertension: Secondary | ICD-10-CM | POA: Diagnosis not present

## 2023-06-18 LAB — HM DIABETES EYE EXAM

## 2023-07-01 DIAGNOSIS — M542 Cervicalgia: Secondary | ICD-10-CM | POA: Diagnosis not present

## 2023-07-01 DIAGNOSIS — K140 Glossitis: Secondary | ICD-10-CM | POA: Diagnosis not present

## 2023-07-01 DIAGNOSIS — K219 Gastro-esophageal reflux disease without esophagitis: Secondary | ICD-10-CM | POA: Diagnosis not present

## 2023-07-01 DIAGNOSIS — Z85850 Personal history of malignant neoplasm of thyroid: Secondary | ICD-10-CM | POA: Diagnosis not present

## 2023-07-01 DIAGNOSIS — K146 Glossodynia: Secondary | ICD-10-CM | POA: Diagnosis not present

## 2023-07-03 ENCOUNTER — Other Ambulatory Visit: Payer: Self-pay | Admitting: Otolaryngology

## 2023-07-03 DIAGNOSIS — M542 Cervicalgia: Secondary | ICD-10-CM

## 2023-07-04 ENCOUNTER — Encounter (INDEPENDENT_AMBULATORY_CARE_PROVIDER_SITE_OTHER): Payer: Self-pay

## 2023-07-18 ENCOUNTER — Ambulatory Visit
Admission: RE | Admit: 2023-07-18 | Discharge: 2023-07-18 | Disposition: A | Discharge status: Home / Self Care | Payer: BC Managed Care – PPO | Source: Ambulatory Visit | Attending: Otolaryngology | Admitting: Otolaryngology

## 2023-07-18 DIAGNOSIS — M542 Cervicalgia: Secondary | ICD-10-CM | POA: Diagnosis not present

## 2023-07-18 MED ORDER — IOPAMIDOL (ISOVUE-300) INJECTION 61%
75.0000 mL | Freq: Once | INTRAVENOUS | Status: AC | PRN
  Administered 2023-07-18: 75 mL via INTRAVENOUS

## 2023-07-23 ENCOUNTER — Other Ambulatory Visit: Payer: Self-pay | Admitting: Nurse Practitioner

## 2023-07-29 ENCOUNTER — Encounter: Payer: Self-pay | Admitting: Nurse Practitioner

## 2023-07-29 ENCOUNTER — Ambulatory Visit: Payer: BC Managed Care – PPO | Admitting: Nurse Practitioner

## 2023-07-29 VITALS — BP 122/78 | HR 68 | Temp 98.1°F | Ht 67.5 in | Wt 136.6 lb

## 2023-07-29 DIAGNOSIS — E1169 Type 2 diabetes mellitus with other specified complication: Secondary | ICD-10-CM

## 2023-07-29 DIAGNOSIS — E890 Postprocedural hypothyroidism: Secondary | ICD-10-CM | POA: Diagnosis not present

## 2023-07-29 DIAGNOSIS — Z7984 Long term (current) use of oral hypoglycemic drugs: Secondary | ICD-10-CM

## 2023-07-29 LAB — POCT GLYCOSYLATED HEMOGLOBIN (HGB A1C): Hemoglobin A1C: 6.8 % — AB (ref 4.0–5.6)

## 2023-07-29 NOTE — Progress Notes (Signed)
   Established Patient Office Visit  Subjective   Patient ID: Melissa Lamb, female    DOB: 1961/07/24  Age: 62 y.o. MRN: [id number]  Chief Complaint  Patient presents with   Diabetes    Pt complains of having pain in neck area. Complains of being tired. Sees endocrinologist for her neck.     HPI  DM2: patient is currently on glipizide 7.5mg  dialy and met formin 500 mg bid. Statea that she has had a harder time this time. States that she is under a lot of stress. States that she is being followed by ENT for mouth and throat discomfort  Checks her glucose almost every day. States that she does need a new meter.  Patient will use supplies she has got and will reach out to me if she has a specific meter she would like sent in  States that she feels like she is sleeping adaquate amounts of sleep. States that some nights are good. States that she is doing mediatation to try and get her brain in the space for sleep.      Review of Systems  Constitutional:  Negative for chills and fever.  Respiratory:  Negative for shortness of breath.   Cardiovascular:  Negative for chest pain.  Neurological:  Positive for dizziness. Negative for headaches.      Objective:     BP 122/78   Pulse 68   Temp 98.1 F (36.7 C) (Temporal)   Ht 5' 7.5" (1.715 m)   Wt 136 lb 9.6 oz (62 kg)   SpO2 99%   BMI 21.08 kg/m    Physical Exam Vitals and nursing note reviewed.  Constitutional:      Appearance: Normal appearance.  Cardiovascular:     Rate and Rhythm: Normal rate and regular rhythm.     Heart sounds: Normal heart sounds.  Pulmonary:     Effort: Pulmonary effort is normal.     Breath sounds: Normal breath sounds.  Lymphadenopathy:     Cervical: No cervical adenopathy.  Neurological:     Mental Status: She is alert.      Results for orders placed or performed in visit on 07/29/23  POCT glycosylated hemoglobin (Hb A1C)  Result Value Ref Range   Hemoglobin A1C 6.8 (A) 4.0 - 5.6 %    HbA1c POC (<> result, manual entry)     HbA1c, POC (prediabetic range)     HbA1c, POC (controlled diabetic range)        The ASCVD Risk score (Arnett DK, et al., 2019) failed to calculate for the following reasons:   The patient has a prior MI or stroke diagnosis    Assessment & Plan:   Problem List Items Addressed This Visit       Endocrine   Type 2 diabetes mellitus with other specified complication (HCC) - Primary    Patient currently on glipizide 7.5 mg daily along with metformin.  A1c 6.8%.  Continue medications prescribed continue doing healthy lifestyle modifications.      Relevant Orders   POCT glycosylated hemoglobin (Hb A1C) (Completed)   Post-surgical hypothyroidism    History of thyroid cancer she is followed by endocrine.  Last TSH within normal limits.  Patient is being followed by ENT currently recent a CT scan of neck results are pending       Return in about 3 months (around 11/08/2023) for CPE and Labs.    Audria Nine, NP

## 2023-07-29 NOTE — Assessment & Plan Note (Signed)
Patient currently on glipizide 7.5 mg daily along with metformin.  A1c 6.8%.  Continue medications prescribed continue doing healthy lifestyle modifications.

## 2023-07-29 NOTE — Patient Instructions (Signed)
Nice to see you today I will see you in 3.5

## 2023-07-29 NOTE — Assessment & Plan Note (Signed)
History of thyroid cancer she is followed by endocrine.  Last TSH within normal limits.  Patient is being followed by ENT currently recent a CT scan of neck results are pending

## 2023-10-15 ENCOUNTER — Other Ambulatory Visit: Payer: Self-pay | Admitting: Nurse Practitioner

## 2023-10-15 NOTE — Telephone Encounter (Signed)
Patient has CPE set up in December as requested at las visit.

## 2023-10-21 ENCOUNTER — Other Ambulatory Visit: Payer: Self-pay | Admitting: Nurse Practitioner

## 2023-10-21 DIAGNOSIS — Z1231 Encounter for screening mammogram for malignant neoplasm of breast: Secondary | ICD-10-CM

## 2023-10-23 DIAGNOSIS — H0102A Squamous blepharitis right eye, upper and lower eyelids: Secondary | ICD-10-CM | POA: Diagnosis not present

## 2023-10-23 DIAGNOSIS — H0102B Squamous blepharitis left eye, upper and lower eyelids: Secondary | ICD-10-CM | POA: Diagnosis not present

## 2023-11-14 ENCOUNTER — Encounter: Payer: BC Managed Care – PPO | Admitting: Nurse Practitioner

## 2023-12-06 ENCOUNTER — Ambulatory Visit
Admission: RE | Admit: 2023-12-06 | Discharge: 2023-12-06 | Disposition: A | Discharge status: Home / Self Care | Payer: BC Managed Care – PPO | Source: Ambulatory Visit | Attending: Nurse Practitioner

## 2023-12-06 DIAGNOSIS — Z1231 Encounter for screening mammogram for malignant neoplasm of breast: Secondary | ICD-10-CM | POA: Insufficient documentation

## 2023-12-10 ENCOUNTER — Encounter: Payer: Self-pay | Admitting: Nurse Practitioner

## 2024-02-26 ENCOUNTER — Ambulatory Visit: Admitting: Nurse Practitioner

## 2024-02-26 ENCOUNTER — Encounter: Payer: Self-pay | Admitting: Nurse Practitioner

## 2024-02-26 VITALS — BP 120/78 | HR 62 | Temp 98.1°F | Ht 67.5 in | Wt 139.4 lb

## 2024-02-26 DIAGNOSIS — Z0000 Encounter for general adult medical examination without abnormal findings: Secondary | ICD-10-CM | POA: Diagnosis not present

## 2024-02-26 DIAGNOSIS — I10 Essential (primary) hypertension: Secondary | ICD-10-CM

## 2024-02-26 DIAGNOSIS — Z13820 Encounter for screening for osteoporosis: Secondary | ICD-10-CM

## 2024-02-26 DIAGNOSIS — E890 Postprocedural hypothyroidism: Secondary | ICD-10-CM

## 2024-02-26 DIAGNOSIS — Z7984 Long term (current) use of oral hypoglycemic drugs: Secondary | ICD-10-CM | POA: Diagnosis not present

## 2024-02-26 DIAGNOSIS — E1169 Type 2 diabetes mellitus with other specified complication: Secondary | ICD-10-CM | POA: Diagnosis not present

## 2024-02-26 DIAGNOSIS — M791 Myalgia, unspecified site: Secondary | ICD-10-CM

## 2024-02-26 LAB — POCT GLYCOSYLATED HEMOGLOBIN (HGB A1C): Hemoglobin A1C: 7.5 % — AB (ref 4.0–5.6)

## 2024-02-26 LAB — COMPREHENSIVE METABOLIC PANEL WITH GFR
ALT: 18 U/L (ref 0–35)
AST: 28 U/L (ref 0–37)
Albumin: 5.3 g/dL — ABNORMAL HIGH (ref 3.5–5.2)
Alkaline Phosphatase: 75 U/L (ref 39–117)
BUN: 13 mg/dL (ref 6–23)
CO2: 30 meq/L (ref 19–32)
Calcium: 9.6 mg/dL (ref 8.4–10.5)
Chloride: 101 meq/L (ref 96–112)
Creatinine, Ser: 0.94 mg/dL (ref 0.40–1.20)
GFR: 64.48 mL/min (ref >60.00)
Glucose, Bld: 163 mg/dL — ABNORMAL HIGH (ref 70–99)
Potassium: 4.5 meq/L (ref 3.5–5.1)
Sodium: 140 meq/L (ref 135–145)
Total Bilirubin: 2.1 mg/dL — ABNORMAL HIGH (ref 0.2–1.2)
Total Protein: 6.7 g/dL (ref 6.0–8.3)

## 2024-02-26 LAB — LIPID PANEL
Cholesterol: 143 mg/dL (ref 0–200)
HDL: 38.9 mg/dL — ABNORMAL LOW (ref >39.00)
LDL Cholesterol: 61 mg/dL (ref 0–99)
NonHDL: 104.58
Total CHOL/HDL Ratio: 4
Triglycerides: 219 mg/dL — ABNORMAL HIGH (ref 0.0–149.0)
VLDL: 43.8 mg/dL — ABNORMAL HIGH (ref 0.0–40.0)

## 2024-02-26 LAB — TSH: TSH: 0.34 u[IU]/mL — ABNORMAL LOW (ref 0.35–5.50)

## 2024-02-26 LAB — MICROALBUMIN / CREATININE URINE RATIO
Creatinine,U: 41.1 mg/dL
Microalb Creat Ratio: UNDETERMINED mg/g (ref 0.0–30.0)
Microalb, Ur: <0.7 mg/dL

## 2024-02-26 LAB — CBC
HCT: 43.1 % (ref 36.0–46.0)
Hemoglobin: 14.2 g/dL (ref 12.0–15.0)
MCHC: 32.9 g/dL (ref 30.0–36.0)
MCV: 93.7 fl (ref 78.0–100.0)
Platelets: 221 10*3/uL (ref 150.0–400.0)
RBC: 4.6 Mil/uL (ref 3.87–5.11)
RDW: 12.9 % (ref 11.5–15.5)
WBC: 4.8 10*3/uL (ref 4.0–10.5)

## 2024-02-26 LAB — CK: Total CK: 121 U/L (ref 7–177)

## 2024-02-26 NOTE — Assessment & Plan Note (Addendum)
 Maintained on metformin 500 mg BID and glipizide 7.5 daily. A1C over goal today at 7.5. Pt admits to poor diet after past 6 months. Discussed options of increasing metformin dose. She would like to try lifestyle modifications at this time. UACR pending. Detailed foot exam done today also  I evaluated patient, was consulted regarding treatment, and agree with assessment and plan per Denice Bors RN, FNP Student   Audria Nine, DNP, AGNP-C

## 2024-02-26 NOTE — Assessment & Plan Note (Addendum)
 Maintained on losartan 25 mg daily and metoprolol 25 mg daily. Managed by Dr. Juliann Pares and has follow up in a few months. CBC, CMP, and lipids pending.

## 2024-02-26 NOTE — Assessment & Plan Note (Addendum)
 Discussed age-appropriate immunizations and screening exams.  Did review patient's personal, surgical, social, family histories.  Patient is up-to-date on all age-appropriate vaccinations she would like. Information provided on shingles vaccine. Ambulatory referral for dexa scan made. Labs pending.  Patient was given information at discharge about preventative healthcare maintenance with anticipatory guidance     I evaluated patient, was consulted regarding treatment, and agree with assessment and plan per Denice Bors RN, FNP Student   Audria Nine, DNP, AGNP-C

## 2024-02-26 NOTE — Patient Instructions (Signed)
 Nice to see you today A1C was 7.5%. I want you to focus on diet and I want to see you in 3 months for a follow up to make sure it is coming back down. I will be in touch with the labs once I have them

## 2024-02-26 NOTE — Assessment & Plan Note (Addendum)
 Maintained on levothyroxine 88 mcg daily and followed by Dr. Gershon Crane at endocrinology. TSH pending.   I evaluated patient, was consulted regarding treatment, and agree with assessment and plan per Denice Bors RN, FNP Student   Audria Nine, DNP, AGNP-C

## 2024-02-26 NOTE — Progress Notes (Signed)
 Established Patient Office Visit  Subjective   Patient ID: Melissa Lamb, female    DOB: 08-08-1961  Age: 63 y.o. MRN: [id number]  Chief Complaint  Patient presents with   Annual Exam    HPI  Melissa Lamb is here for complete physical and follow up of chronic conditions.  DM2: Maintained on metformin 500mg  BID, glipizide 7.5 mg daily. Checks her glucose daily in the AM and she reports a range of 160-200. Denies any symptoms. No episodes of hypoglycemia. Reports being under a lot of stress with home repairs.   HTN:  Managed by Dr. Juliann Pares at [facility]. Maintained on losartan 25 mg daily, metoprolol 25 mg daily. She is also maintained on rosuvastatin 40 mg daily. She reports that she is having myalgias primarily in her legs.   Postsurgical Hypothyroidsim: Postsurgical total thyroidectomy. Maintained on levothyroxine 88 mcg daily. Managed by Dr. Gershon Crane at endocrinology.   Immunizations: -Tetanus: Completed in 2019 -Influenza: Last completed 08/2022 -Shingles: Information provided  Diet: Fair diet. 3 meals without snacks. Eating more fast food recently.  Exercise: No regular exercise. Starting to try to get back into exercising with some walking.  Sleep: 6 hours a night Eye exam: Completed this past year.  Dental exam: Completes semi-annually   Pap: Hysterectomy Mammo: 2025 Dexa: Will send referral today Colonoscopy: Completed in 2021 Lung Cancer Screening: Low risk . Not indicated       Review of Systems  Constitutional:  Negative for chills, fever and weight loss.  Eyes:  Negative for blurred vision.  Respiratory:  Negative for shortness of breath.   Cardiovascular:  Negative for chest pain and palpitations.  Gastrointestinal:  Negative for abdominal pain, nausea and vomiting.  Genitourinary:  Negative for dysuria.  Musculoskeletal:  Negative for myalgias.  Neurological:  Negative for dizziness and headaches.      Objective:     BP 120/78   Pulse  62   Temp 98.1 F (36.7 C) (Oral)   Ht 5' 7.5" (1.715 m)   Wt 63.2 kg   SpO2 98%   BMI 21.51 kg/m  BP Readings from Last 3 Encounters:  02/26/24 120/78  07/29/23 122/78  03/26/23 118/66   Wt Readings from Last 3 Encounters:  02/26/24 63.2 kg  07/29/23 62 kg  03/26/23 62.8 kg   SpO2 Readings from Last 3 Encounters:  02/26/24 98%  07/29/23 99%  03/26/23 99%      Physical Exam Constitutional:      Appearance: Normal appearance.  Eyes:     Extraocular Movements: Extraocular movements intact.     Pupils: Pupils are equal, round, and reactive to light.  Cardiovascular:     Rate and Rhythm: Normal rate and regular rhythm.     Heart sounds: Normal heart sounds. No murmur heard.    No friction rub. No gallop.  Pulmonary:     Effort: Pulmonary effort is normal.     Breath sounds: Normal breath sounds.  Abdominal:     General: Bowel sounds are normal.     Palpations: Abdomen is soft.  Skin:    General: Skin is warm and dry.  Neurological:     Mental Status: She is alert and oriented to person, place, and time.     Deep Tendon Reflexes:     Reflex Scores:      Bicep reflexes are 2+ on the right side and 2+ on the left side.      Patellar reflexes are 2+ on the right side and 2+  on the left side.    Comments: Bilateral upper and lower extremity strength 5/5  Psychiatric:        Mood and Affect: Mood normal.        Behavior: Behavior normal.      Results for orders placed or performed in visit on 02/26/24  POCT glycosylated hemoglobin (Hb A1C)  Result Value Ref Range   Hemoglobin A1C 7.5 (A) 4.0 - 5.6 %   HbA1c POC (<> result, manual entry)     HbA1c, POC (prediabetic range)     HbA1c, POC (controlled diabetic range)        The ASCVD Risk score (Arnett DK, et al., 2019) failed to calculate for the following reasons:   Risk score cannot be calculated because patient has a medical history suggesting prior/existing ASCVD    Assessment & Plan:   Problem List  Items Addressed This Visit     Type 2 diabetes mellitus with other specified complication (HCC) - Primary   Maintained on metformin 500 mg BID and glipizide 7.5 daily. A1C over goal today at 7.5. Pt admits to poor diet after past 6 months. Discussed options of increasing metformin dose. She would like to try lifestyle modifications at this time. UACR pending. Detailed foot exam done today also  I evaluated patient, was consulted regarding treatment, and agree with assessment and plan per Melissa Bors RN, FNP Student   Audria Nine, DNP, AGNP-C        Relevant Medications   aspirin EC 81 MG tablet   Other Relevant Orders   POCT glycosylated hemoglobin (Hb A1C) (Completed)   Lipid panel   Microalbumin / creatinine urine ratio   Essential hypertension   Maintained on losartan 25 mg daily and metoprolol 25 mg daily. Managed by Dr. Juliann Pares and has follow up in a few months. CBC, CMP, and lipids pending.       Relevant Medications   aspirin EC 81 MG tablet   Other Relevant Orders   CBC   Lipid panel   Comprehensive metabolic panel with GFR   Microalbumin / creatinine urine ratio   Preventative health care   Discussed age-appropriate immunizations and screening exams.  Did review patient's personal, surgical, social, family histories.  Patient is up-to-date on all age-appropriate vaccinations she would like. Information provided on shingles vaccine. Ambulatory referral for dexa scan made. Labs pending.  Patient was given information at discharge about preventative healthcare maintenance with anticipatory guidance     I evaluated patient, was consulted regarding treatment, and agree with assessment and plan per Melissa Bors RN, FNP Student   Audria Nine, DNP, AGNP-C       Relevant Orders   CBC   Comprehensive metabolic panel with GFR   Post-surgical hypothyroidism   Maintained on levothyroxine 88 mcg daily and followed by Dr. Gershon Crane at endocrinology. TSH pending.   I evaluated  patient, was consulted regarding treatment, and agree with assessment and plan per Melissa Bors RN, FNP Student   Audria Nine, DNP, AGNP-C       Relevant Orders   TSH   Myalgia   Consideration given for statin therapy. Will check CK to rule out rhabdo.      Relevant Orders   CK   Screening for osteoporosis   Relevant Orders   DG Bone Density    Return in about 3 months (around 05/27/2024) for DM recheck.    Murvin Donning, RN

## 2024-02-26 NOTE — Assessment & Plan Note (Addendum)
 Consideration given for statin therapy. Will check CK to rule out rhabdo.  I evaluated patient, was consulted regarding treatment, and agree with assessment and plan per Denice Bors RN, FNP Student   Audria Nine, DNP, AGNP-C

## 2024-02-26 NOTE — Progress Notes (Signed)
 Established Patient Office Visit  Subjective   Patient ID: Melissa Lamb, female    DOB: [birth year]  Age: 63 y.o. MRN: [id number]  Chief Complaint  Patient presents with   Annual Exam    HPI  DM2: patient is currently on glipizide 7.5 every day and metformin 500 BID. States that she is not eating as weel because of stress.  She is having more fast food.   HTN: on losartan 25mg , metoprolol 25 daily she is followed by cardiology.   for complete physical and follow up of chronic conditions.  Immunizations: -Tetanus: Completed in 2023 -Influenza: not up up today  -Shingles: Completed Shingrix series -Pneumonia:   Diet: Fair diet. As of late she has been under increased stress. She has been eating out more Exercise: No regular exercise. States that she fell off the bandwagon. She has started back with some walking   Eye exam: Completes annually. Within glasses  Dental exam: Completes semi-annually    Colonoscopy: Completed in 10/24/2020, repeat in 5 years Lung Cancer Screening: NA  Pap Smear: Hysterectomy  Mammogram: 11/26/2023  DEXA: Needs updating, order placed today   Sleep: she is getting 6 hours of sleep at night.        Review of Systems  Constitutional:  Negative for chills and fever.  Respiratory:  Negative for shortness of breath.   Cardiovascular:  Negative for chest pain and leg swelling.  Gastrointestinal:  Negative for abdominal pain, blood in stool, constipation, diarrhea, nausea and vomiting.       BM daily to every other day   Genitourinary:  Negative for dysuria and hematuria.  Neurological:  Negative for tingling and headaches.  Psychiatric/Behavioral:  Negative for hallucinations and suicidal ideas.       Objective:     BP 120/78   Pulse 62   Temp 98.1 F (36.7 C) (Oral)   Ht 5' 7.5" (1.715 m)   Wt 139 lb 6.4 oz (63.2 kg)   SpO2 98%   BMI 21.51 kg/m    Physical Exam Vitals and nursing note reviewed.  Constitutional:       Appearance: Normal appearance.  HENT:     Right Ear: Tympanic membrane, ear canal and external ear normal.     Left Ear: Tympanic membrane, ear canal and external ear normal.     Mouth/Throat:     Mouth: Mucous membranes are moist.     Pharynx: Oropharynx is clear.  Eyes:     Extraocular Movements: Extraocular movements intact.     Pupils: Pupils are equal, round, and reactive to light.  Cardiovascular:     Rate and Rhythm: Normal rate and regular rhythm.     Pulses: Normal pulses.     Heart sounds: Normal heart sounds.  Pulmonary:     Effort: Pulmonary effort is normal.     Breath sounds: Normal breath sounds.  Abdominal:     General: Bowel sounds are normal. There is no distension.     Palpations: There is no mass.     Tenderness: There is no abdominal tenderness.     Hernia: No hernia is present.  Musculoskeletal:     Right lower leg: No edema.     Left lower leg: No edema.  Lymphadenopathy:     Cervical: No cervical adenopathy.  Skin:    General: Skin is warm.  Neurological:     General: No focal deficit present.     Mental Status: She is alert.     Deep  Tendon Reflexes:     Reflex Scores:      Bicep reflexes are 2+ on the right side and 2+ on the left side.      Patellar reflexes are 2+ on the right side and 2+ on the left side.    Comments: Bilateral upper and lower extremity strength 5/5  Psychiatric:        Mood and Affect: Mood normal.        Behavior: Behavior normal.        Thought Content: Thought content normal.        Judgment: Judgment normal.    Title   Diabetic Foot Exam - detailed Is there a history of foot ulcer?: No Is there a foot ulcer now?: No Is there swelling?: No Is there elevated skin temperature?: No Is there abnormal foot shape?: No Is there a claw toe deformity?: No Are the toenails long?: No Are the toenails thick?: No Are the toenails ingrown?: No Pulse Foot Exam completed.: Yes   Right Posterior Tibialis: Present Left posterior  Tibialis: Present   Right Dorsalis Pedis: Present Left Dorsalis Pedis: Present     Sensory Foot Exam Completed.: Yes Semmes-Weinstein Monofilament Test "+" means "has sensation" and "-" means "no sensation"      Image components are not supported.   Image components are not supported. Image components are not supported.  Tuning Fork Comments All 10 sites sensation intact bilaterally   Bunions to bilateral medial great toes      Results for orders placed or performed in visit on 02/26/24  POCT glycosylated hemoglobin (Hb A1C)  Result Value Ref Range   Hemoglobin A1C 7.5 (A) 4.0 - 5.6 %   HbA1c POC (<> result, manual entry)     HbA1c, POC (prediabetic range)     HbA1c, POC (controlled diabetic range)        The ASCVD Risk score (Arnett DK, et al., 2019) failed to calculate for the following reasons:   Risk score cannot be calculated because patient has a medical history suggesting prior/existing ASCVD    Assessment & Plan:   Problem List Items Addressed This Visit       Cardiovascular and Mediastinum   Essential hypertension   Maintained on losartan 25 mg daily and metoprolol 25 mg daily. Managed by Dr. Juliann Pares and has follow up in a few months. CBC, CMP, and lipids pending.       Relevant Medications   aspirin EC 81 MG tablet   Other Relevant Orders   CBC   Lipid panel   Comprehensive metabolic panel with GFR   Microalbumin / creatinine urine ratio     Endocrine   Type 2 diabetes mellitus with other specified complication (HCC) - Primary   Maintained on metformin 500 mg BID and glipizide 7.5 daily. A1C over goal today at 7.5. Pt admits to poor diet after past 6 months. Discussed options of increasing metformin dose. She would like to try lifestyle modifications at this time. UACR pending. Detailed foot exam done today also  I evaluated patient, was consulted regarding treatment, and agree with assessment and plan per Denice Bors RN, FNP Student   Audria Nine,  DNP, AGNP-C        Relevant Medications   aspirin EC 81 MG tablet   Other Relevant Orders   POCT glycosylated hemoglobin (Hb A1C) (Completed)   Lipid panel   Microalbumin / creatinine urine ratio   Post-surgical hypothyroidism   Maintained on levothyroxine 88 mcg daily  and followed by Dr. Gershon Crane at endocrinology. TSH pending.   I evaluated patient, was consulted regarding treatment, and agree with assessment and plan per Denice Bors RN, FNP Student   Audria Nine, DNP, AGNP-C       Relevant Orders   TSH     Other   Preventative health care   Discussed age-appropriate immunizations and screening exams.  Did review patient's personal, surgical, social, family histories.  Patient is up-to-date on all age-appropriate vaccinations she would like. Information provided on shingles vaccine. Ambulatory referral for dexa scan made. Labs pending.  Patient was given information at discharge about preventative healthcare maintenance with anticipatory guidance     I evaluated patient, was consulted regarding treatment, and agree with assessment and plan per Denice Bors RN, FNP Student   Audria Nine, DNP, AGNP-C       Relevant Orders   CBC   Comprehensive metabolic panel with GFR   Myalgia   Consideration given for statin therapy. Will check CK to rule out rhabdo.  I evaluated patient, was consulted regarding treatment, and agree with assessment and plan per Denice Bors RN, FNP Student   Audria Nine, DNP, AGNP-C       Relevant Orders   CK   Screening for osteoporosis   Relevant Orders   DG Bone Density    Return in about 3 months (around 05/27/2024) for DM recheck.    Audria Nine, NP

## 2024-02-27 ENCOUNTER — Encounter: Payer: Self-pay | Admitting: Nurse Practitioner

## 2024-03-12 DIAGNOSIS — E890 Postprocedural hypothyroidism: Secondary | ICD-10-CM | POA: Diagnosis not present

## 2024-03-12 DIAGNOSIS — C73 Malignant neoplasm of thyroid gland: Secondary | ICD-10-CM | POA: Diagnosis not present

## 2024-03-19 DIAGNOSIS — E890 Postprocedural hypothyroidism: Secondary | ICD-10-CM | POA: Diagnosis not present

## 2024-03-19 DIAGNOSIS — C73 Malignant neoplasm of thyroid gland: Secondary | ICD-10-CM | POA: Diagnosis not present

## 2024-04-07 ENCOUNTER — Ambulatory Visit: Payer: Self-pay

## 2024-04-07 NOTE — Telephone Encounter (Addendum)
  Chief Complaint: rash Symptoms: right armpit area Frequency: x2-3 days Pertinent Negatives: Patient denies fever, generalized rash, environmental changes Disposition: [] ED /[] Urgent Care (no appt availability in office) / [x] Appointment(In office/virtual)/ []  [place] [facility]/ [] Home Care/ [] Refused Recommended Disposition /[] [place] [facility]/ []  Follow-up with PCP Additional Notes: Pt c/o localized rash noted around R armpit area. Pt reports gardening over the weekend and unsure if caused by bug bite. Pt endorses multiple clusters of bumps that is raised and red, and occasionally itchy. Pt has been using OTC calamine and other topical treatments with minimal relief. Scheduled patient per protocol on 04/09/2024. Patient verbalized understanding and to call back with worsening symptoms.      Copied from CRM [id number]. Topic: Clinical - Red Word Triage >> Apr 07, 2024  8:10 AM El Gravely T wrote: [facility] that prompted transfer to Nurse Triage: Red inflamed painful rash, with blisters. Per patient possible insect bite on right arm. Reason for Disposition  [1] Pimples (localized) AND [place]it[place].Distel ] no improvement after using Care Advice  Answer Assessment - Initial Assessment Questions 1. APPEARANCE of RASH: "Describe the rash."      "It seems a bit angry" It's red and bumpy Endorses working in garden and unsure if bug bite 2. LOCATION: "Where is the rash located?"      By armpit area on R arm 3. NUMBER: "How many spots are there?"      2 separate clusters with a lot of bumps 4. SIZE: "How big are the spots?" (Inches, centimeters or compare to size of a coin)      Clusters size of eraser head 5. ONSET: "When did the rash start?"      Over the weekend 6. ITCHING: "Does the rash itch?" If Yes, ask: "How bad is the itch?"  (Scale 0-10; or none, mild, moderate, severe)     Mild itching 7. PAIN: "Does the rash hurt?" If Yes, ask: "How bad is the pain?"  (Scale 0-10; or none, mild, moderate,  severe)    - NONE (0): no pain    - MILD (1-3): doesn't interfere with normal activities     - MODERATE (4-7): interferes with normal activities or awakens from sleep     - SEVERE (8-10): excruciating pain, unable to do any normal activities     Mild - with movement 8. OTHER SYMPTOMS: "Do you have any other symptoms?" (e.g., fever)     denies  Protocols used: Rash or Redness - Localized-A-AH

## 2024-04-07 NOTE — Telephone Encounter (Signed)
 Will evaluate in office

## 2024-04-08 ENCOUNTER — Ambulatory Visit: Admitting: Internal Medicine

## 2024-04-08 ENCOUNTER — Encounter: Payer: Self-pay | Admitting: Internal Medicine

## 2024-04-08 ENCOUNTER — Other Ambulatory Visit: Payer: Self-pay | Admitting: Nurse Practitioner

## 2024-04-08 ENCOUNTER — Ambulatory Visit: Payer: Self-pay

## 2024-04-08 VITALS — BP 120/74 | HR 78 | Temp 99.0°F | Ht 67.5 in | Wt 139.0 lb

## 2024-04-08 DIAGNOSIS — B029 Zoster without complications: Secondary | ICD-10-CM | POA: Insufficient documentation

## 2024-04-08 MED ORDER — VALACYCLOVIR HCL 1 G PO TABS
1000.0000 mg | ORAL_TABLET | Freq: Two times a day (BID) | ORAL | 1 refills | Status: DC
Start: 2024-04-08 — End: 2024-08-31

## 2024-04-08 NOTE — Progress Notes (Signed)
 Subjective:    Patient ID: Melissa Lamb, female    DOB: [birth year], 63 y.o.   MRN: [id number]  HPI Here due to a rash  Noticed something in right axilla in January--slight pain Then other illness since then Finally just over a cough  Bit on back while working in garden--5 days ago Then started getting sensitive skin on extensor right arm Then circular area with clusters of lesions in axilla Then got new lesion somewhat lower Painful--in "zaps"  Current Outpatient Medications on File Prior to Visit  Medication Sig Dispense Refill   aspirin  EC 81 MG tablet Take by mouth.     Blood Glucose Monitoring Suppl (ACCU-CHEK AVIVA PLUS) w/Device KIT USE TO CHECK BLOOD SUGAR ONCE DAILY     Cholecalciferol (VITAMIN D -1000 MAX ST) 25 MCG (1000 UT) tablet Take 2,000 Units by mouth daily.      fenofibrate  160 MG tablet Take 1 tablet (160 mg total) by mouth daily. 90 tablet 2   glipiZIDE  (GLUCOTROL ) 5 MG tablet TAKE 1 AND 1/2 TABLETS BY MOUTH DAILY 135 tablet 1   glucose blood (ACCU-CHEK AVIVA PLUS) test strip USE TO CHECK BLOOD SUGAR ONCE DAILY 100 each 4   levothyroxine  (SYNTHROID ) 88 MCG tablet Take 88 mcg by mouth daily.      losartan (COZAAR) 25 MG tablet Take 50 mg by mouth daily.      metFORMIN  (GLUCOPHAGE ) 500 MG tablet TAKE 1 TABLET BY MOUTH TWICE A DAY 180 tablet 1   metoprolol  succinate (TOPROL -XL) 25 MG 24 hr tablet Take 25 mg by mouth daily.     rosuvastatin  (CRESTOR ) 40 MG tablet Take 1 tablet by mouth daily.     No current facility-administered medications on file prior to visit.    No Active Allergies  Past Medical History:  Diagnosis Date   Diabetes mellitus without complication (HCC)    History of chickenpox    Hyperlipidemia    Hypertension    Myocardial infarction (HCC)    Thyroid  cancer [facility])     Past Surgical History:  Procedure Laterality Date   ABDOMINAL HYSTERECTOMY  2014   has both ovaries still.   APPENDECTOMY  2019   BREAST BIOPSY Right     benign-seed was put in to mark it   BREAST REDUCTION SURGERY     CHOLECYSTECTOMY  2019   COLONOSCOPY WITH PROPOFOL  N/A 10/24/2020   Procedure: COLONOSCOPY WITH PROPOFOL ;  Surgeon: Selena Daily, MD;  Location: [facility] ENDOSCOPY;  Service: Gastroenterology;  Laterality: N/A;   CORONARY/GRAFT ACUTE MI REVASCULARIZATION N/A 04/01/2020   Procedure: Coronary/Graft Acute MI Revascularization;  Surgeon: Antonette Batters, MD;  Location: [facility] INVASIVE CV LAB;  Service: Cardiovascular;  Laterality: N/A;   LEFT HEART CATH AND CORONARY ANGIOGRAPHY N/A 04/01/2020   Procedure: LEFT HEART CATH AND CORONARY ANGIOGRAPHY;  Surgeon: Antonette Batters, MD;  Location: [facility] INVASIVE CV LAB;  Service: Cardiovascular;  Laterality: N/A;   REDUCTION MAMMAPLASTY     THYROIDECTOMY  2014   papillary well-differentiated lymph nodes.   TONSILLECTOMY AND ADENOIDECTOMY      Family History  Problem Relation Age of Onset   Diabetes Mother    Hyperlipidemia Mother    Hypertension Mother    Hyperlipidemia Father    Heart disease Father    Heart attack Father 64       had defibrillator put in   Diabetes Sister    Hypertension Sister    Diabetes Sister    Hypertension Sister    Diabetes  Brother    Hypertension Brother    Cervical cancer Maternal Aunt    Breast cancer Maternal Aunt 2057/05/15   Uterine cancer Maternal Grandmother        ovarain   Breast cancer Maternal Grandmother     Social History   Socioeconomic History   Marital status: Widowed    Spouse name: Lenette Quick   Number of children: 2   Years of education: Some college   Highest education level: Not on file  Occupational History   Not on file  Tobacco Use   Smoking status: Never    Passive exposure: Past   Smokeless tobacco: Never  Vaping Use   Vaping status: Never Used  Substance and Sexual Activity   Alcohol use: Yes    Comment: rare   Drug use: Never   Sexual activity: Not Currently  Other Topics Concern   Not on file  Social History  Narrative   10/20/19   From: [place] /[place]    Living: with son - Verdie Gladden   Work: not currently, caring for her grandchild   Widowed: husband, Lenette Quick, passed away in 05-16-2019 - Dr who trained at [employer]      Family: Daughter - Trevor Fudge (nearby), has 1 grandchild - Pharmacist, community 05/15/18)      Enjoys: crafts, doing projects, gardening      Exercise: not as often, tries to walk   Diet: well rounded, does eat some unhealthy food, tries to follow diabetic diet      Safety   Seat belts: Yes    Guns: No   Safe in relationships: Yes    Social Drivers of Corporate investment banker Strain: Low Risk  (10/20/2019)   Overall Financial Resource Strain (CARDIA)    Difficulty of Paying Living Expenses: Not hard at all  Food Insecurity: Not on file  Transportation Needs: Not on file  Physical Activity: Not on file  Stress: Not on file  Social Connections: Not on file  Intimate Partner Violence: Not on file   Review of Systems No fever No N/V     Objective:   Physical Exam Constitutional:      Appearance: Normal appearance.  Skin:    Comments: Classic cluster of vesicles in right axilla--with one isolated vesicle below  Neurological:     Mental Status: She is alert.            Assessment & Plan:

## 2024-04-08 NOTE — Telephone Encounter (Signed)
  Chief Complaint: medication question  Disposition: [] ED /[] Urgent Care (no appt availability in office) / [] Appointment(In office/virtual)/ []  [place] [facility]/ [] Home Care/ [] Refused Recommended Disposition /[] [place] [facility]/ [x]  Follow-up with PCP Additional Notes: Patient states Dr Joelle Musca told her to take the medication three times daily and per his note from today, "Will give valacyclovir 1000 tid x 7 days". However, medication was prescribed bid. Please clarify with patient the correct frequency to take the medication. Called [facility] and spoke with Betty Bruckner, advised of patient's question.  Copied from CRM [id number]. Topic: Clinical - Medication Question >> Apr 08, 2024 10:19 AM Kita Perish H wrote: Reason for CRM: Patient is calling to confirm if she should be taking the valACYclovir (VALTREX) 1000 MG tablets 2x a day or 3x a day, states she thought Dr. Joelle Musca told her 3x a day but prescription written for 2x a day, please clarify, thanks.  [place] [phone] Reason for Disposition  [1] Caller has URGENT medicine question about med that PCP or specialist prescribed AND [2] triager unable to answer question  Answer Assessment - Initial Assessment Questions 1. NAME of MEDICINE: "What medicine(s) are you calling about?"     Valacyclovir  2. QUESTION: "What is your question?" (e.g., double dose of medicine, side effect)     Patient would like to know the correct frequency to take.  3. PRESCRIBER: "Who prescribed the medicine?" Reason: if prescribed by specialist, call should be referred to that group.     Dr Joelle Musca.  4. SYMPTOMS: "Do you have any symptoms?" If Yes, ask: "What symptoms are you having?"  "How bad are the symptoms (e.g., mild, moderate, severe)     N/A.  5. PREGNANCY:  "Is there any chance that you are pregnant?" "When was your last menstrual period?"     N/A.  Protocols used: Medication Question Call-A-AH

## 2024-04-08 NOTE — Telephone Encounter (Signed)
 Spoke to pt. Verified with her that the OV note says 3 times daily. The rx defaulted to 2 a day but was still written to provide 3 a day for 7 days.

## 2024-04-08 NOTE — Assessment & Plan Note (Signed)
 Classic appearance Pain is not bad--but can use lidocaine  patch if worsens Will give valacyclovir 1000 tid x 7 days

## 2024-04-08 NOTE — Telephone Encounter (Signed)
  Chief Complaint: rash, localized Symptoms: mild pain/itching, blistered Frequency: about 5 days Pertinent Negatives: Patient denies fever,  Disposition: [] ED /[] Urgent Care (no appt availability in office) / [x] Appointment(In office/virtual)/ []  [place] [facility]/ [] Home Care/ [] Refused Recommended Disposition /[] [place] [facility]/ []  Follow-up with PCP Additional Notes: Pt states that rash is localized and started about 5 days ago. Pt states that this morning she woke with a new blistered area.  Copied from CRM [id number]. Topic: Clinical - Red Word Triage >> Apr 08, 2024  8:00 AM Oddis Bench wrote: Red Word that prompted transfer to Nurse Triage: Patient is calling about a rash she is stating that it ha spread. She is stating that they are blisters. Reason for Disposition  Localized rash present > 7 days  Answer Assessment - Initial Assessment Questions 1. APPEARANCE of RASH: "Describe the rash."      blisters 2. LOCATION: "Where is the rash located?"      Near R armpit area 3. NUMBER: "How many spots are there?"      2-main clump and patches 4. SIZE: "How big are the spots?" (Inches, centimeters or compare to size of a coin)      1-2" 5. ONSET: "When did the rash start?"      5 days 6. ITCHING: "Does the rash itch?" If Yes, ask: "How bad is the itch?"  (Scale 0-10; or none, mild, moderate, severe)     sometimes 7. PAIN: "Does the rash hurt?" If Yes, ask: "How bad is the pain?"  (Scale 0-10; or none, mild, moderate, severe)    - NONE (0): no pain    - MILD (1-3): doesn't interfere with normal activities     - MODERATE (4-7): interferes with normal activities or awakens from sleep     - SEVERE (8-10): excruciating pain, unable to do any normal activities     4 8. OTHER SYMPTOMS: "Do you have any other symptoms?" (e.g., fever)     denies  Protocols used: Rash or Redness - Localized-A-AH

## 2024-04-08 NOTE — Telephone Encounter (Signed)
I will assess her at today's visit

## 2024-04-08 NOTE — Telephone Encounter (Signed)
 Per [facility], Dr Joelle Musca has confirmed patient should take valacyclovir three times daily. Attempted to call patient and daughter, received "call cannot be completed as dialed" error message. Unable to reach patient.

## 2024-04-09 ENCOUNTER — Ambulatory Visit: Admitting: Nurse Practitioner

## 2024-04-17 ENCOUNTER — Other Ambulatory Visit: Payer: Self-pay | Admitting: Nurse Practitioner

## 2024-05-06 ENCOUNTER — Telehealth: Payer: Self-pay | Admitting: Nurse Practitioner

## 2024-05-06 ENCOUNTER — Encounter: Payer: Self-pay | Admitting: Nurse Practitioner

## 2024-05-06 DIAGNOSIS — Z1231 Encounter for screening mammogram for malignant neoplasm of breast: Secondary | ICD-10-CM

## 2024-05-06 NOTE — Telephone Encounter (Signed)
 Copied from CRM [id number]. Topic: General - Other >> May 06, 2024  2:30 PM Jenice Mitts wrote: Reason for CRM: Patient stated with ICD-10 Z13.820 prior approval is needed through her insurance for them to cover the bone scan

## 2024-05-06 NOTE — Telephone Encounter (Signed)
 Copied from CRM [id number]. Topic: General - Other >> May 06, 2024 12:55 PM [place] A wrote: Reason for CRM: Patient said Insurance wants a GPT code for Bone Scan

## 2024-05-06 NOTE — Telephone Encounter (Signed)
 noted

## 2024-05-06 NOTE — Telephone Encounter (Signed)
 Called pt and relayed code to her. Pt verbalized understanding and wrote down code for insurance.  States she will call them. No other concerns or questions at this time.

## 2024-05-06 NOTE — Telephone Encounter (Signed)
 X32.440 is the icd 10 code

## 2024-05-07 DIAGNOSIS — Z955 Presence of coronary angioplasty implant and graft: Secondary | ICD-10-CM | POA: Diagnosis not present

## 2024-05-07 DIAGNOSIS — I252 Old myocardial infarction: Secondary | ICD-10-CM | POA: Diagnosis not present

## 2024-05-07 DIAGNOSIS — I2510 Atherosclerotic heart disease of native coronary artery without angina pectoris: Secondary | ICD-10-CM | POA: Diagnosis not present

## 2024-05-07 DIAGNOSIS — R252 Cramp and spasm: Secondary | ICD-10-CM | POA: Diagnosis not present

## 2024-05-08 ENCOUNTER — Ambulatory Visit
Admission: RE | Admit: 2024-05-08 | Discharge: 2024-05-08 | Disposition: A | Discharge status: Home / Self Care | Source: Ambulatory Visit | Attending: Nurse Practitioner | Admitting: Nurse Practitioner

## 2024-05-08 DIAGNOSIS — Z13820 Encounter for screening for osteoporosis: Secondary | ICD-10-CM | POA: Diagnosis not present

## 2024-05-08 DIAGNOSIS — Z780 Asymptomatic menopausal state: Secondary | ICD-10-CM | POA: Diagnosis not present

## 2024-05-08 DIAGNOSIS — M8589 Other specified disorders of bone density and structure, multiple sites: Secondary | ICD-10-CM | POA: Diagnosis not present

## 2024-05-13 ENCOUNTER — Ambulatory Visit: Payer: Self-pay | Admitting: Nurse Practitioner

## 2024-05-27 ENCOUNTER — Ambulatory Visit: Admitting: Nurse Practitioner

## 2024-05-27 VITALS — BP 112/64 | HR 69 | Temp 97.8°F | Ht 67.5 in | Wt 137.2 lb

## 2024-05-27 DIAGNOSIS — E1169 Type 2 diabetes mellitus with other specified complication: Secondary | ICD-10-CM | POA: Diagnosis not present

## 2024-05-27 DIAGNOSIS — Z7984 Long term (current) use of oral hypoglycemic drugs: Secondary | ICD-10-CM

## 2024-05-27 DIAGNOSIS — R1011 Right upper quadrant pain: Secondary | ICD-10-CM

## 2024-05-27 DIAGNOSIS — L659 Nonscarring hair loss, unspecified: Secondary | ICD-10-CM | POA: Diagnosis not present

## 2024-05-27 DIAGNOSIS — M8580 Other specified disorders of bone density and structure, unspecified site: Secondary | ICD-10-CM | POA: Diagnosis not present

## 2024-05-27 LAB — POCT GLYCOSYLATED HEMOGLOBIN (HGB A1C): Hemoglobin A1C: 7 % — AB (ref 4.0–5.6)

## 2024-05-27 LAB — VITAMIN D 25 HYDROXY (VIT D DEFICIENCY, FRACTURES): VITD: 74.35 ng/mL (ref 30.00–100.00)

## 2024-05-27 LAB — VITAMIN B12: Vitamin B-12: 608 pg/mL (ref 211–911)

## 2024-05-27 MED ORDER — METFORMIN HCL 500 MG PO TABS: ORAL_TABLET | ORAL | 1 refills | Status: DC

## 2024-05-27 NOTE — Assessment & Plan Note (Signed)
 Recent finding from DEXA scan.  Patient currently taking vitamin D  and will pick up calcium  supplementation.  Will check vitamin D  level today

## 2024-05-27 NOTE — Assessment & Plan Note (Signed)
 Currently maintained on glipizide  7.5 mL and metformin  500 mg twice daily.  A1c has trended back down to 7.0 we will increase metformin  to 1000 mg in the morning and 500 mg in the evening.  Continue glipizide  at this.  Continue checking glucose at home

## 2024-05-27 NOTE — Progress Notes (Signed)
 Established Patient Office Visit  Subjective   Patient ID: Melissa Lamb, female    DOB: [birth year]  Age: 63 y.o. MRN: [id number]  Chief Complaint  Patient presents with   Diabetes    HPI  7.0 DM2: Patient currently maintained on glipizide  7.5 mg daily and metformin  500 mg twice daily.  Of note patient is on losartan and rosuvastatin .  States that she is checking her sugar at least a few ties a day. States that she has been getting the 200s and as of laste shehas been getting the 180s  States that she has been walking and out in the garden.   HTN: Patient currently maintained on aspirin  81 mg daily, losartan 25 mg daily, metoprolol  25 mg daily.  She is followed by cardiology  Hx of the liver cyst and had a hx of chlesectomy and she would notice bile in her stool . As of late she has not noticed the bile or diarrhea. States that she does have some swellling to the Right quadrant.   Hair loss: is followed by endocrine and Is on thyroid  replacement therapy.  Mention taking vitamin B12     Review of Systems  Constitutional:  Negative for chills and fever.  Respiratory:  Negative for shortness of breath.   Cardiovascular:  Negative for chest pain.  Gastrointestinal:        Bm every other to every 3rd day   Neurological:  Negative for headaches.  Psychiatric/Behavioral:  Negative for hallucinations and suicidal ideas.       Objective:     BP 112/64   Pulse 69   Temp 97.8 F (36.6 C) (Oral)   Ht 5' 7.5 (1.715 m)   Wt 137 lb 3.2 oz (62.2 kg)   SpO2 98%   BMI 21.17 kg/m  BP Readings from Last 3 Encounters:  05/27/24 112/64  04/08/24 120/74  02/26/24 120/78   Wt Readings from Last 3 Encounters:  05/27/24 137 lb 3.2 oz (62.2 kg)  04/08/24 139 lb (63 kg)  02/26/24 139 lb 6.4 oz (63.2 kg)   SpO2 Readings from Last 3 Encounters:  05/27/24 98%  04/08/24 99%  02/26/24 98%      Physical Exam Vitals and nursing note reviewed.  Constitutional:       Appearance: Normal appearance.  Cardiovascular:     Rate and Rhythm: Normal rate and regular rhythm.     Heart sounds: Normal heart sounds.  Pulmonary:     Effort: Pulmonary effort is normal.     Breath sounds: Normal breath sounds.  Abdominal:     General: Bowel sounds are normal.     Tenderness: There is abdominal tenderness.  Neurological:     Mental Status: She is alert.      Results for orders placed or performed in visit on 05/27/24  POCT glycosylated hemoglobin (Hb A1C)  Result Value Ref Range   Hemoglobin A1C 7.0 (A) 4.0 - 5.6 %   HbA1c POC (<> result, manual entry)     HbA1c, POC (prediabetic range)     HbA1c, POC (controlled diabetic range)        The ASCVD Risk score (Arnett DK, et al., 2019) failed to calculate for the following reasons:   Risk score cannot be calculated because patient has a medical history suggesting prior/existing ASCVD    Assessment & Plan:   Problem List Items Addressed This Visit       Endocrine   Type 2 diabetes mellitus with other specified complication (  HCC) - Primary   Currently maintained on glipizide  7.5 mL and metformin  500 mg twice daily.  A1c has trended back down to 7.0 we will increase metformin  to 1000 mg in the morning and 500 mg in the evening.  Continue glipizide  at this.  Continue checking glucose at home      Relevant Medications   metFORMIN  (GLUCOPHAGE ) 500 MG tablet   Other Relevant Orders   POCT glycosylated hemoglobin (Hb A1C) (Completed)     Musculoskeletal and Integument   Osteopenia   Recent finding from DEXA scan.  Patient currently taking vitamin D  and will pick up calcium  supplementation.  Will check vitamin D  level today      Relevant Orders   VITAMIN D  25 Hydroxy (Vit-D Deficiency, Fractures)     Other   RUQ pain   Right upper quadrant pain with subjective swelling.  Patient uses a cyst on liver she is status postcholecystectomy.  Will do ultrasound of right upper quadrant.  Patient given  information to call and get scheduled ultrasound order was placed.      Relevant Orders   US  ABDOMEN LIMITED RUQ (LIVER/GB)   Hair loss   Relevant Orders   Vitamin B12   VITAMIN D  25 Hydroxy (Vit-D Deficiency, Fractures)    Return in about 3 months (around 08/27/2024) for DM recheck.    Adina Crandall, NP

## 2024-05-27 NOTE — Assessment & Plan Note (Signed)
 Right upper quadrant pain with subjective swelling.  Patient uses a cyst on liver she is status postcholecystectomy.  Will do ultrasound of right upper quadrant.  Patient given information to call and get scheduled ultrasound order was placed.

## 2024-05-29 ENCOUNTER — Encounter: Payer: Self-pay | Admitting: *Deleted

## 2024-05-29 DIAGNOSIS — R252 Cramp and spasm: Secondary | ICD-10-CM | POA: Diagnosis not present

## 2024-06-02 ENCOUNTER — Ambulatory Visit: Payer: Self-pay | Admitting: Nurse Practitioner

## 2024-06-09 ENCOUNTER — Ambulatory Visit
Admission: RE | Admit: 2024-06-09 | Discharge: 2024-06-09 | Disposition: A | Discharge status: Home / Self Care | Source: Ambulatory Visit | Attending: Nurse Practitioner | Admitting: Nurse Practitioner

## 2024-06-09 DIAGNOSIS — R1011 Right upper quadrant pain: Secondary | ICD-10-CM | POA: Diagnosis not present

## 2024-06-09 DIAGNOSIS — Z9049 Acquired absence of other specified parts of digestive tract: Secondary | ICD-10-CM | POA: Diagnosis not present

## 2024-06-09 DIAGNOSIS — K7689 Other specified diseases of liver: Secondary | ICD-10-CM | POA: Diagnosis not present

## 2024-06-15 DIAGNOSIS — R0602 Shortness of breath: Secondary | ICD-10-CM | POA: Diagnosis not present

## 2024-06-15 DIAGNOSIS — I252 Old myocardial infarction: Secondary | ICD-10-CM | POA: Diagnosis not present

## 2024-06-15 DIAGNOSIS — E079 Disorder of thyroid, unspecified: Secondary | ICD-10-CM | POA: Diagnosis not present

## 2024-06-15 DIAGNOSIS — E782 Mixed hyperlipidemia: Secondary | ICD-10-CM | POA: Diagnosis not present

## 2024-06-15 DIAGNOSIS — I2510 Atherosclerotic heart disease of native coronary artery without angina pectoris: Secondary | ICD-10-CM | POA: Diagnosis not present

## 2024-06-29 DIAGNOSIS — R0602 Shortness of breath: Secondary | ICD-10-CM | POA: Diagnosis not present

## 2024-07-22 DIAGNOSIS — Z955 Presence of coronary angioplasty implant and graft: Secondary | ICD-10-CM | POA: Diagnosis not present

## 2024-07-22 DIAGNOSIS — I10 Essential (primary) hypertension: Secondary | ICD-10-CM | POA: Diagnosis not present

## 2024-07-22 DIAGNOSIS — I252 Old myocardial infarction: Secondary | ICD-10-CM | POA: Diagnosis not present

## 2024-07-22 DIAGNOSIS — I2510 Atherosclerotic heart disease of native coronary artery without angina pectoris: Secondary | ICD-10-CM | POA: Diagnosis not present

## 2024-08-27 ENCOUNTER — Ambulatory Visit: Admitting: Nurse Practitioner

## 2024-08-31 ENCOUNTER — Ambulatory Visit: Admitting: Nurse Practitioner

## 2024-08-31 VITALS — BP 110/62 | HR 78 | Temp 98.2°F | Ht 67.5 in | Wt 135.8 lb

## 2024-08-31 DIAGNOSIS — E1169 Type 2 diabetes mellitus with other specified complication: Secondary | ICD-10-CM

## 2024-08-31 DIAGNOSIS — Z7984 Long term (current) use of oral hypoglycemic drugs: Secondary | ICD-10-CM | POA: Diagnosis not present

## 2024-08-31 DIAGNOSIS — L608 Other nail disorders: Secondary | ICD-10-CM

## 2024-08-31 LAB — POCT GLYCOSYLATED HEMOGLOBIN (HGB A1C): Hemoglobin A1C: 6.6 % — AB (ref 4.0–5.6)

## 2024-08-31 NOTE — Progress Notes (Signed)
 Established Patient Office Visit  Subjective   Patient ID: Melissa Lamb, female    DOB: Mar 04, 1961  Age: 63 y.o. MRN: [id number]  Chief Complaint  Patient presents with   Diabetes    Discussed the use of AI scribe software for clinical note transcription with the patient, who gave verbal consent to proceed.  History of Present Illness Melissa Lamb is a 63 year old female with diabetes who presents for follow-up on her diabetes management.  She manages her diabetes with an increased dosage of metformin , taking two tablets in the morning and one in the evening, along with glipizide  7.5 mg daily. Her blood glucose levels have been consistently under 200 mg/dL, and her recent J8r is 6.6; previous values were 7.0 and 7.5. She does not experience hypoglycemic episodes.  She experienced an episode of hypotension with a blood pressure reading of 80/50 mmHg, which led to the discontinuation of metoprolol . This episode was accompanied by heavy panting and an inability to move. Since stopping metoprolol , she feels anxious and occasionally experiences low blood pressure readings. She manages her anxiety by checking her blood pressure and engaging in distraction activities.  She is experiencing ongoing stress related to a legal issue concerning her home, which contributes to her anxiety. She feels powerless and frustrated with the legal process, affecting her living conditions and mental well-being.  She is concerned about a line on her nail and is considering seeing a dermatologist for further evaluation. She previously saw a doctor for a body check but was not satisfied with the thoroughness of the examination.  Her weight has decreased slightly from 139 lbs to 135 lbs over the past few visits, and she is content with her current body size. She continues to exercise regularly and is mindful of maintaining her weight. Her bowel and bladder functions are normal.     Review of Systems   Constitutional:  Negative for chills and fever.  Respiratory:  Negative for shortness of breath.   Cardiovascular:  Negative for chest pain.  Neurological:  Negative for headaches.      Objective:     BP 110/62   Pulse 78   Temp 98.2 F (36.8 C) (Oral)   Ht 5' 7.5 (1.715 m)   Wt 135 lb 12.8 oz (61.6 kg)   SpO2 96%   BMI 20.96 kg/m  BP Readings from Last 3 Encounters:  08/31/24 110/62  05/27/24 112/64  04/08/24 120/74   Wt Readings from Last 3 Encounters:  08/31/24 135 lb 12.8 oz (61.6 kg)  05/27/24 137 lb 3.2 oz (62.2 kg)  04/08/24 139 lb (63 kg)   SpO2 Readings from Last 3 Encounters:  08/31/24 96%  05/27/24 98%  04/08/24 99%      Physical Exam Vitals and nursing note reviewed.  Constitutional:      Appearance: Normal appearance.  Cardiovascular:     Rate and Rhythm: Normal rate and regular rhythm.     Heart sounds: Normal heart sounds.  Pulmonary:     Effort: Pulmonary effort is normal.     Breath sounds: Normal breath sounds.  Neurological:     Mental Status: She is alert.      Results for orders placed or performed in visit on 08/31/24  POCT glycosylated hemoglobin (Hb A1C)  Result Value Ref Range   Hemoglobin A1C 6.6 (A) 4.0 - 5.6 %   HbA1c POC (<> result, manual entry)     HbA1c, POC (prediabetic range)     HbA1c, POC (  controlled diabetic range)        The ASCVD Risk score (Arnett DK, et al., 2019) failed to calculate for the following reasons:   Risk score cannot be calculated because patient has a medical history suggesting prior/existing ASCVD    Assessment & Plan:   Problem List Items Addressed This Visit       Endocrine   Type 2 diabetes mellitus with other specified complication (HCC) - Primary   Relevant Orders   POCT glycosylated hemoglobin (Hb A1C) (Completed)     Musculoskeletal and Integument   Nail pitting  Assessment and Plan Assessment & Plan Type 2 diabetes mellitus with other specified complication Diabetes  well-controlled with improved A1c from 7.0% to 6.6%. No hypoglycemia. Tolerating medications well. - Continue Metformin  1000 mg QAM and 500mg  QPM and Glipizide  7.5 mg daily. - Follow up in 6 months for A1c evaluation.  Anxiety symptoms Anxiety symptoms linked to medication changes and stressors, exacerbated by metoprolol  discontinuation. - Encourage exercise and distraction techniques.    Return in about 6 months (around 03/01/2025) for CPE and Labs.    Adina Crandall, NP

## 2024-08-31 NOTE — Patient Instructions (Addendum)
 Nice to see you today  When you find a dermatologist that you would like to see message me on mychart and let me know  Follow up with me in 6 months for you physical and labs   Your A1C was 6.6

## 2024-10-13 ENCOUNTER — Other Ambulatory Visit: Payer: Self-pay | Admitting: Nurse Practitioner

## 2024-11-07 ENCOUNTER — Other Ambulatory Visit: Payer: Self-pay | Admitting: Nurse Practitioner

## 2024-11-07 DIAGNOSIS — E1169 Type 2 diabetes mellitus with other specified complication: Secondary | ICD-10-CM

## 2024-11-27 ENCOUNTER — Other Ambulatory Visit: Payer: Self-pay | Admitting: Nurse Practitioner

## 2024-11-27 DIAGNOSIS — Z1231 Encounter for screening mammogram for malignant neoplasm of breast: Secondary | ICD-10-CM

## 2024-12-18 ENCOUNTER — Ambulatory Visit
Admission: RE | Admit: 2024-12-18 | Discharge: 2024-12-18 | Disposition: A | Source: Ambulatory Visit | Attending: Nurse Practitioner

## 2024-12-18 DIAGNOSIS — Z1231 Encounter for screening mammogram for malignant neoplasm of breast: Secondary | ICD-10-CM | POA: Diagnosis present

## 2024-12-24 ENCOUNTER — Ambulatory Visit: Payer: Self-pay | Admitting: Nurse Practitioner

## 2025-03-01 ENCOUNTER — Encounter: Admitting: Nurse Practitioner
